# Patient Record
Sex: Male | Born: 1965 | Race: White | Hispanic: No | Marital: Married | Smoking: Current every day smoker
Health system: Southern US, Community
[De-identification: ages and names within clinical notes are randomized; demographics above are authoritative.]

## PROBLEM LIST (undated history)

## (undated) DIAGNOSIS — E785 Hyperlipidemia, unspecified: Secondary | ICD-10-CM

## (undated) DIAGNOSIS — E291 Testicular hypofunction: Secondary | ICD-10-CM

## (undated) DIAGNOSIS — I10 Essential (primary) hypertension: Secondary | ICD-10-CM

## (undated) DIAGNOSIS — E559 Vitamin D deficiency, unspecified: Secondary | ICD-10-CM

## (undated) HISTORY — DX: Testicular hypofunction: E29.1

## (undated) HISTORY — DX: Vitamin D deficiency, unspecified: E55.9

## (undated) HISTORY — DX: Essential (primary) hypertension: I10

## (undated) HISTORY — PX: ROTATOR CUFF REPAIR: SHX139

## (undated) HISTORY — DX: Hyperlipidemia, unspecified: E78.5

## (undated) HISTORY — PX: FINGER SURGERY: SHX640

---

## 1998-02-26 ENCOUNTER — Emergency Department (HOSPITAL_COMMUNITY): Admission: EM | Admit: 1998-02-26 | Discharge: 1998-02-26 | Payer: Self-pay | Admitting: Emergency Medicine

## 2004-08-09 ENCOUNTER — Emergency Department (HOSPITAL_COMMUNITY): Admission: EM | Admit: 2004-08-09 | Discharge: 2004-08-09 | Payer: Self-pay | Admitting: Emergency Medicine

## 2006-03-31 ENCOUNTER — Ambulatory Visit (HOSPITAL_COMMUNITY): Admission: RE | Admit: 2006-03-31 | Discharge: 2006-03-31 | Payer: Self-pay | Admitting: Internal Medicine

## 2012-10-09 ENCOUNTER — Emergency Department (HOSPITAL_COMMUNITY)
Admission: EM | Admit: 2012-10-09 | Discharge: 2012-10-09 | Disposition: A | Discharge status: Home / Self Care | Payer: BC Managed Care – PPO | Attending: Emergency Medicine | Admitting: Emergency Medicine

## 2012-10-09 ENCOUNTER — Encounter (HOSPITAL_COMMUNITY): Payer: Self-pay | Admitting: Emergency Medicine

## 2012-10-09 DIAGNOSIS — Y929 Unspecified place or not applicable: Secondary | ICD-10-CM | POA: Insufficient documentation

## 2012-10-09 DIAGNOSIS — Z79899 Other long term (current) drug therapy: Secondary | ICD-10-CM | POA: Insufficient documentation

## 2012-10-09 DIAGNOSIS — Y9389 Activity, other specified: Secondary | ICD-10-CM | POA: Insufficient documentation

## 2012-10-09 DIAGNOSIS — S058X9A Other injuries of unspecified eye and orbit, initial encounter: Secondary | ICD-10-CM | POA: Insufficient documentation

## 2012-10-09 DIAGNOSIS — IMO0002 Reserved for concepts with insufficient information to code with codable children: Secondary | ICD-10-CM | POA: Insufficient documentation

## 2012-10-09 DIAGNOSIS — S0500XA Injury of conjunctiva and corneal abrasion without foreign body, unspecified eye, initial encounter: Secondary | ICD-10-CM

## 2012-10-09 DIAGNOSIS — F17200 Nicotine dependence, unspecified, uncomplicated: Secondary | ICD-10-CM | POA: Insufficient documentation

## 2012-10-09 MED ORDER — TETRACAINE HCL 0.5 % OP SOLN
2.0000 [drp] | Freq: Once | OPHTHALMIC | Status: DC
  Filled 2012-10-09: qty 2

## 2012-10-09 MED ORDER — TRAMADOL HCL 50 MG PO TABS: 50.0000 mg | ORAL_TABLET | Freq: Four times a day (QID) | ORAL | Status: DC | PRN

## 2012-10-09 MED ORDER — POLYMYXIN B-TRIMETHOPRIM 10000-0.1 UNIT/ML-% OP SOLN: 1.0000 [drp] | OPHTHALMIC | Status: DC

## 2012-10-09 NOTE — ED Notes (Signed)
Pt states he inadvertently struck self in R eye with stick. Pt eye red/swollen. Pt c/o blurred vision

## 2012-10-09 NOTE — ED Provider Notes (Signed)
History    This chart was scribed for non-physician practitioner working with Gerhard Munch, MD by Burman Nieves. This patient was seen in room WTR9/WTR9 and the patient's care was started at 10:49 PM.   CSN: [id number]  Arrival date & time 10/09/12  2131      Chief Complaint  Patient presents with  . Eye Injury    (Consider location/radiation/quality/duration/timing/severity/associated sxs/prior treatment) Patient is a 47 y.o. male presenting with eye injury. The history is provided by the patient. No language interpreter was used.  Eye Injury This is a new problem. The current episode started 6 to 12 hours ago. The problem occurs constantly. The problem has not changed since onset.Pertinent negatives include no shortness of breath. Nothing aggravates the symptoms. Nothing relieves the symptoms. He has tried nothing for the symptoms.   HURSCHEL PAYNTER is a 47 y.o. male who presents to the Emergency Department complaining of moderate constant right eye pain onset today. He was scratched by a branch on the right eye today. He splashed some water on eye afterwards without relief. He reports blurred vision. Pt denies of fever, chills, cough, nausea, vomiting, diarrhea, SOB, weakness, and any other associated symptoms.  No drainage from the eye.  He does not wear contacts.   History reviewed. No pertinent past medical history.  Past Surgical History  Procedure Laterality Date  . Rotator cuff repair    . Finger surgery      No family history on file.  History  Substance Use Topics  . Smoking status: Current Every Day Smoker  . Smokeless tobacco: Not on file  . Alcohol Use: Yes     Comment: occasional      Review of Systems  Constitutional: Negative for fever and chills.  Eyes: Positive for pain, redness and visual disturbance. Negative for discharge.  Respiratory: Negative for shortness of breath.   Gastrointestinal: Negative for nausea and vomiting.  Neurological: Negative for  weakness.  All other systems reviewed and are negative.    Allergies  Vicodin  Home Medications   Current Outpatient Rx  Name  Route  Sig  Dispense  Refill  . ALPRAZolam (XANAX) 1 MG tablet   Oral   Take 1-1.5 mg by mouth at bedtime as needed for sleep (for sleep).         . Aspirin-Acetaminophen-Caffeine (GOODY HEADACHE PO)   Oral   Take 1 Package by mouth 2 (two) times daily as needed (for pain).         Marland Kitchen atorvastatin (LIPITOR) 80 MG tablet   Oral   Take 40 mg by mouth 3 (three) times a week.         . vitamin C (ASCORBIC ACID) 500 MG tablet   Oral   Take 500 mg by mouth every morning.           BP 179/91  Pulse 83  Temp(Src) 97.7 F (36.5 C) (Oral)  Resp 18  Ht 5\' 8"  (1.727 m)  Wt 170 lb (77.111 kg)  BMI 25.85 kg/m2  SpO2 97%  Physical Exam  Nursing note and vitals reviewed. Constitutional: He is oriented to person, place, and time. He appears well-developed and well-nourished. No distress.  HENT:  Head: Normocephalic and atraumatic.  Mouth/Throat: Oropharynx is clear and moist.  Eyes: Conjunctivae, EOM and lids are normal. Pupils are equal, round, and reactive to light. No foreign bodies found. Left conjunctiva is not injected. Left conjunctiva has no hemorrhage.  Slit lamp exam:  The right eye shows corneal abrasion and fluorescein uptake.  Right cornea abrasion  Neck: Normal range of motion. Neck supple. No tracheal deviation present.  Cardiovascular: Normal rate, regular rhythm and normal heart sounds.   Pulmonary/Chest: Effort normal and breath sounds normal. No respiratory distress.  Musculoskeletal: Normal range of motion.  Neurological: He is alert and oriented to person, place, and time.  Skin: Skin is warm and dry. He is not diaphoretic.  Psychiatric: He has a normal mood and affect. His behavior is normal.    ED Course  Procedures (including critical care time) DIAGNOSTIC STUDIES: Oxygen Saturation is 97% on room air, normal by  my interpretation.    COORDINATION OF CARE:  11:02 PM Discussed ED treatment with pt and pt agrees.     Labs Reviewed - No data to display No results found.   No diagnosis found.    MDM  Patient with corneal abrasion.  Patient given antibiotic eye drops and given referral to Opthalmology.    I personally performed the services described in this documentation, which was scribed in my presence. The recorded information has been reviewed and is accurate.    Pascal Lux Howard, PA-C 10/10/12 1048

## 2012-10-10 NOTE — ED Provider Notes (Signed)
  Medical screening examination/treatment/procedure(s) were performed by non-physician practitioner and as supervising physician I was immediately available for consultation/collaboration.    Deiontae Rabel, MD 10/10/12 2124 

## 2013-08-01 ENCOUNTER — Encounter: Payer: Self-pay | Admitting: Internal Medicine

## 2013-08-01 ENCOUNTER — Ambulatory Visit (INDEPENDENT_AMBULATORY_CARE_PROVIDER_SITE_OTHER): Payer: BC Managed Care – PPO | Admitting: Internal Medicine

## 2013-08-01 VITALS — BP 146/98 | HR 88 | Temp 99.9°F | Resp 18 | Wt 168.8 lb

## 2013-08-01 DIAGNOSIS — E782 Mixed hyperlipidemia: Secondary | ICD-10-CM

## 2013-08-01 DIAGNOSIS — R509 Fever, unspecified: Secondary | ICD-10-CM

## 2013-08-01 DIAGNOSIS — F411 Generalized anxiety disorder: Secondary | ICD-10-CM

## 2013-08-01 DIAGNOSIS — E559 Vitamin D deficiency, unspecified: Secondary | ICD-10-CM

## 2013-08-01 DIAGNOSIS — I10 Essential (primary) hypertension: Secondary | ICD-10-CM

## 2013-08-01 LAB — CBC WITH DIFFERENTIAL/PLATELET
Eosinophils Absolute: 0.5 10*3/uL (ref 0.0–0.7)
Eosinophils Relative: 6 % — ABNORMAL HIGH (ref 0–5)
HCT: 46.5 % (ref 39.0–52.0)
Hemoglobin: 15.7 g/dL (ref 13.0–17.0)
Lymphs Abs: 2.5 10*3/uL (ref 0.7–4.0)
MCH: 30.3 pg (ref 26.0–34.0)
MCHC: 33.8 g/dL (ref 30.0–36.0)
MCV: 89.6 fL (ref 78.0–100.0)
Monocytes Relative: 5 % (ref 3–12)
Platelets: 197 10*3/uL (ref 150–400)
RBC: 5.19 MIL/uL (ref 4.22–5.81)
RDW: 12.7 % (ref 11.5–15.5)
WBC: 8.1 10*3/uL (ref 4.0–10.5)

## 2013-08-01 MED ORDER — LEVOFLOXACIN 500 MG PO TABS: 500.0000 mg | ORAL_TABLET | Freq: Every day | ORAL | Status: AC

## 2013-08-01 MED ORDER — BISOPROLOL-HYDROCHLOROTHIAZIDE 5-6.25 MG PO TABS: 1.0000 | ORAL_TABLET | Freq: Every day | ORAL | Status: DC

## 2013-08-01 NOTE — Patient Instructions (Signed)
Continue diet & medications same as discussed.   Further disposition pending lab results.      Hypertension As your heart beats, it forces blood through your arteries. This force is your blood pressure. If the pressure is too high, it is called hypertension (HTN) or high blood pressure. HTN is dangerous because you may have it and not know it. High blood pressure may mean that your heart has to work harder to pump blood. Your arteries may be narrow or stiff. The extra work puts you at risk for heart disease, stroke, and other problems.  Blood pressure consists of two numbers, a higher number over a lower, 110/72, for example. It is stated as "110 over 72." The ideal is below 120 for the top number (systolic) and under 80 for the bottom (diastolic). Write down your blood pressure today. You should pay close attention to your blood pressure if you have certain conditions such as:  Heart failure.  Prior heart attack.  Diabetes  Chronic kidney disease.  Prior stroke.  Multiple risk factors for heart disease. To see if you have HTN, your blood pressure should be measured while you are seated with your arm held at the level of the heart. It should be measured at least twice. A one-time elevated blood pressure reading (especially in the Emergency Department) does not mean that you need treatment. There may be conditions in which the blood pressure is different between your right and left arms. It is important to see your caregiver soon for a recheck. Most people have essential hypertension which means that there is not a specific cause. This type of high blood pressure may be lowered by changing lifestyle factors such as:  Stress.  Smoking.  Lack of exercise.  Excessive weight.  Drug/tobacco/alcohol use.  Eating less salt. Most people do not have symptoms from high blood pressure until it has caused damage to the body. Effective treatment can often prevent, delay or reduce that  damage. TREATMENT  When a cause has been identified, treatment for high blood pressure is directed at the cause. There are a large number of medications to treat HTN. These fall into several categories, and your caregiver will help you select the medicines that are best for you. Medications may have side effects. You should review side effects with your caregiver. If your blood pressure stays high after you have made lifestyle changes or started on medicines,   Your medication(s) may need to be changed.  Other problems may need to be addressed.  Be certain you understand your prescriptions, and know how and when to take your medicine.  Be sure to follow up with your caregiver within the time frame advised (usually within two weeks) to have your blood pressure rechecked and to review your medications.  If you are taking more than one medicine to lower your blood pressure, make sure you know how and at what times they should be taken. Taking two medicines at the same time can result in blood pressure that is too low. SEEK IMMEDIATE MEDICAL CARE IF:  You develop a severe headache, blurred or changing vision, or confusion.  You have unusual weakness or numbness, or a faint feeling.  You have severe chest or abdominal pain, vomiting, or breathing problems. MAKE SURE YOU:   Understand these instructions.  Will watch your condition.  Will get help right away if you are not doing well or get worse. Document Released: 08/18/2005 Document Revised: 11/10/2011 Document Reviewed: 04/07/2008 [employer] Patient  Information 2014 [place], [place].  Cholesterol Cholesterol is a white, waxy, fat-like protein needed by your body in small amounts. The liver makes all the cholesterol you need. It is carried from the liver by the blood through the blood vessels. Deposits (plaque) may build up on blood vessel walls. This makes the arteries narrower and stiffer. Plaque increases the risk for heart attack and  stroke. You cannot feel your cholesterol level even if it is very high. The only way to know is by a blood test to check your lipid (fats) levels. Once you know your cholesterol levels, you should keep a record of the test results. Work with your caregiver to to keep your levels in the desired range. WHAT THE RESULTS MEAN:  Total cholesterol is a rough measure of all the cholesterol in your blood.  LDL is the so-called bad cholesterol. This is the type that deposits cholesterol in the walls of the arteries. You want this level to be low.  HDL is the good cholesterol because it cleans the arteries and carries the LDL away. You want this level to be high.  Triglycerides are fat that the body can either burn for energy or store. High levels are closely linked to heart disease. DESIRED LEVELS:  Total cholesterol below 200.  LDL below 100 for people at risk, below 70 for very high risk.  HDL above 50 is good, above 60 is best.  Triglycerides below 150. HOW TO LOWER YOUR CHOLESTEROL:  Diet.  Choose fish or white meat chicken and [place], roasted or baked. Limit fatty cuts of red meat, fried foods, and processed meats, such as sausage and lunch meat.  Eat lots of fresh fruits and vegetables. Choose whole grains, beans, pasta, potatoes and cereals.  Use only small amounts of olive, corn or canola oils. Avoid butter, mayonnaise, shortening or palm kernel oils. Avoid foods with trans-fats.  Use skim/nonfat milk and low-fat/nonfat yogurt and cheeses. Avoid whole milk, cream, ice cream, egg yolks and cheeses. Healthy desserts include angel food cake, ginger snaps, animal crackers, hard candy, popsicles, and low-fat/nonfat frozen yogurt. Avoid pastries, cakes, pies and cookies.  Exercise.  A regular program helps decrease LDL and raises HDL.  Helps with weight control.  Do things that increase your activity level like gardening, walking, or taking the stairs.  Medication.  May be  prescribed by your caregiver to help lowering cholesterol and the risk for heart disease.  You may need medicine even if your levels are normal if you have several risk factors. HOME CARE INSTRUCTIONS   Follow your diet and exercise programs as suggested by your caregiver.  Take medications as directed.  Have blood work done when your caregiver feels it is necessary. MAKE SURE YOU:   Understand these instructions.  Will watch your condition.  Will get help right away if you are not doing well or get worse. Document Released: 05/13/2001 Document Revised: 11/10/2011 Document Reviewed: 11/03/2007 [facility] Patient Information 2014 [place], [place]. Fever, Adult A fever is a higher than normal body temperature. In an adult, an oral temperature around 98.6 F (37 C) is considered normal. A temperature of 100.4 F (38 C) or higher is generally considered a fever. Mild or moderate fevers generally have no long-term effects and often do not require treatment. Extreme fever (greater than or equal to 106 F or 41.1 C) can cause seizures. The sweating that may occur with repeated or prolonged fever may cause dehydration. Elderly people can develop confusion during a fever.  A measured temperature can vary with:  Age.  Time of day.  Method of measurement (mouth, underarm, rectal, or ear). The fever is confirmed by taking a temperature with a thermometer. Temperatures can be taken different ways. Some methods are accurate and some are not.  An oral temperature is used most commonly. Electronic thermometers are fast and accurate.  An ear temperature will only be accurate if the thermometer is positioned as recommended by the manufacturer.  A rectal temperature is accurate and done for those adults who have a condition where an oral temperature cannot be taken.  An underarm (axillary) temperature is not accurate and not recommended. Fever is a symptom, not a disease.  CAUSES   Infections  commonly cause fever.  Some noninfectious causes for fever include:  Some arthritis conditions.  Some thyroid or adrenal gland conditions.  Some immune system conditions.  Some types of cancer.  A medicine reaction.  High doses of certain street drugs such as methamphetamine.  Dehydration.  Exposure to high outside or room temperatures.  Occasionally, the source of a fever cannot be determined. This is sometimes called a "fever of unknown origin" (FUO).  Some situations may lead to a temporary rise in body temperature that may go away on its own. Examples are:  Childbirth.  Surgery.  Intense exercise. HOME CARE INSTRUCTIONS   Take appropriate medicines for fever. Follow dosing instructions carefully. If you use acetaminophen to reduce the fever, be careful to avoid taking other medicines that also contain acetaminophen. Do not take aspirin for a fever if you are younger than age 65. There is an association with Reye's syndrome. Reye's syndrome is a rare but potentially deadly disease.  If an infection is present and antibiotics have been prescribed, take them as directed. Finish them even if you start to feel better.  Rest as needed.  Maintain an adequate fluid intake. To prevent dehydration during an illness with prolonged or recurrent fever, you may need to drink extra fluid.Drink enough fluids to keep your urine clear or pale yellow.  Sponging or bathing with room temperature water may help reduce body temperature. Do not use ice water or alcohol sponge baths.  Dress comfortably, but do not over-bundle. SEEK MEDICAL CARE IF:   You are unable to keep fluids down.  You develop vomiting or diarrhea.  You are not feeling at least partly better after 3 days.  You develop new symptoms or problems. SEEK IMMEDIATE MEDICAL CARE IF:   You have shortness of breath or trouble breathing.  You develop excessive weakness.  You are dizzy or you faint.  You are extremely  thirsty or you are making little or no urine.  You develop new pain that was not there before (such as in the head, neck, chest, back, or abdomen).  You have persistant vomiting and diarrhea for more than 1 to 2 days.  You develop a stiff neck or your eyes become sensitive to light.  You develop a skin rash.  You have a fever or persistent symptoms for more than 2 to 3 days.  You have a fever and your symptoms suddenly get worse. MAKE SURE YOU:   Understand these instructions.  Will watch your condition.  Will get help right away if you are not doing well or get worse. Document Released: 02/11/2001 Document Revised: 11/10/2011 Document Reviewed: 06/19/2011 [facility] Patient Information 2014 [place], [place].

## 2013-08-01 NOTE — Progress Notes (Signed)
Patient ID: Grant Campbell, male   DOB: [birth year], 47 y.o.   MRN: [id number]   This very nice 47 yo MWM presents for 6 week  follow up with Hypertension, Hyperlipidemia and Vitamin D deficiency. At last exam, patient's BP was 148/100. He attributes this to job stresses.   BP has been controlled at home. Today's BP is    . Patient denies any cardiac type chest pain, palpitations, dyspnea/orthopnea/PND, dizziness, claudication, or dependent edema.   Hyperlipidemia is not controlled with diet. Last LDL cholesterol was 125 and he freely admits poor dietary compliance.   Also, at last OV he admitted moderately heavy daily alcohol consumption of ~ 1& 1/2 fifths of whiskey per week. Since started on citalopram and evening alprazolam, he has reduced his total last 6 week alcohol consumption to less than 1 pint of liquor!   Further, Patient has history of vitamin D deficiency with last vitamin D of 47. Patient supplements vitamin D without any suspected side-effects.  Current Outpatient Prescriptions on File Prior to Visit  Medication Sig Dispense Refill   CITALOPRAM 40 mg qd     . ALPRAZolam (XANAX) 0.5 MG tablet Take 1-3 tab by mouth each evening      . Aspirin-Acetaminophen-Caffeine (GOODY HEADACHE PO) Take 1 Package by mouth 2 (two) times daily as needed (for pain).      Marland Kitchen atorvastatin (LIPITOR) 80 MG tablet Take 40 mg by mouth 3 (three) times a week.      . vitamin C (ASCORBIC ACID) 500 MG tablet Take 500 mg by mouth every morning.       No current facility-administered medications on file prior to visit.     Allergies  Allergen Reactions  . Vicodin [Hydrocodone-Acetaminophen] Other (See Comments)    "makes my skin crawl"    PMHx:   Past Medical History  Diagnosis Date  . Hyperlipidemia   . Hypertension   . Hypogonadism male   . Vitamin D deficiency     FHx:    Reviewed / unchanged  SHx:    Reviewed / unchanged  Systems Review: Constitutional: Denies fever, chills, wt changes,  headaches, insomnia, fatigue, night sweats, change in appetite. Eyes: Denies redness, blurred vision, diplopia, discharge, itchy, watery eyes.  ENT: Denies discharge, congestion, post nasal drip, epistaxis, sore throat, earache, hearing loss, dental pain, tinnitus, vertigo, sinus pain, snoring.  CV: Denies chest pain, palpitations, irregular heartbeat, syncope, dyspnea, diaphoresis, orthopnea, PND, claudication, edema. Respiratory: denies cough, dyspnea, DOE, pleurisy, hoarseness, laryngitis, wheezing.  Gastrointestinal: Denies dysphagia, odynophagia, heartburn, reflux, water brash, abdominal pain or cramps, nausea, vomiting, bloating, diarrhea, constipation, hematemesis, melena, hematochezia,  Hemorrhoids. Genitourinary: Denies dysuria, frequency, urgency, nocturia, hesitancy, discharge, hematuria, flank pain. Musculoskeletal: Denies arthralgias, myalgias, stiffness, jt. swelling, pain, limp, strain/sprain.  Skin: Denies pruritus, rash, hives, warts, acne, eczema, change in skin lesion(s). Neuro: No weakness, tremor, incoordination, spasms, paresthesia, or pain. Psychiatric: Denies confusion, memory loss, or sensory loss. Endo: Denies change in weight, skin, hair change.  Heme/Lymph: No excessive bleeding, bruising, orenlarged lymph nodes.  Filed Vitals:   08/01/13 1642  BP: 146/98  Pulse: 88  Temp: 99.9 F (37.7 C)  Resp: 18    Estimated body mass index is 25.67 kg/(m^2) as calculated from the following:   Height as of 10/09/12: 5\' 8"  (1.727 m).   Weight as of this encounter: 168 lb 12.8 oz (76.567 kg).  On Exam: Appears well nourished - in no distress. Eyes: PERRLA, EOMs, conjunctiva no swelling or erythema. Sinuses:  No frontal/maxillary tenderness ENT/Mouth: EAC's clear, TM's nl w/o erythema, bulging. Nares clear w/o erythema, swelling, exudates. Oropharynx clear without erythema or exudates. Oral hygiene is good. Tongue normal, non obstructing. Hearing intact.  Neck: Supple.  Thyroid nl. Car 2+/2+ without bruits, nodes or JVD. Chest: Respirations nl with BS clear & equal w/o rales, rhonchi, wheezing or stridor.  Cor: Heart sounds normal w/ regular rate and rhythm without sig. murmurs, gallops, clicks, or rubs. Peripheral pulses normal and equal  without edema.  Abdomen: Soft & bowel sounds normal. Non-tender w/o guarding, rebound, hernias, masses, or organomegaly.  Lymphatics: Unremarkable.  Musculoskeletal: Full ROM all peripheral extremities, joint stability, 5/5 strength, and normal gait.  Skin: Warm, dry without exposed rashes, lesions, ecchymosis apparent.  Neuro: Cranial nerves intact, reflexes equal bilaterally. Sensory-motor testing grossly intact. Tendon reflexes grossly intact.  Pysch: Alert & oriented x 3. Insight and judgement nl & appropriate. No ideations.  Assessment and Plan:  1. Hypertension - Continue monitor blood pressure at home. Start Rx Ziac-5  2. Hyperlipidemia - Continue diet/meds, exercise,& lifestyle modifications pending labs..   3. Vitamin D Deficiency - Continue supplementation.  4. Fever , obscure etiology - check CBC & U/A&C/S  Further disposition pending results of labs.

## 2013-08-02 LAB — LIPID PANEL
Cholesterol: 253 mg/dL — ABNORMAL HIGH (ref 0–200)
Total CHOL/HDL Ratio: 6.5 Ratio
Triglycerides: 418 mg/dL — ABNORMAL HIGH (ref <150)

## 2013-08-02 LAB — URINALYSIS, MICROSCOPIC ONLY
Bacteria, UA: NONE SEEN
Casts: NONE SEEN
Crystals: NONE SEEN
Squamous Epithelial / HPF: NONE SEEN

## 2013-08-03 ENCOUNTER — Telehealth: Payer: Self-pay

## 2013-08-03 LAB — URINE CULTURE
Colony Count: NO GROWTH
Organism ID, Bacteria: NO GROWTH

## 2013-08-03 NOTE — Telephone Encounter (Signed)
Pt's wife Roanna Raider) aware.

## 2013-08-29 ENCOUNTER — Other Ambulatory Visit: Payer: Self-pay | Admitting: Internal Medicine

## 2013-08-29 MED ORDER — PANTOPRAZOLE SODIUM 40 MG PO TBEC: 40.0000 mg | DELAYED_RELEASE_TABLET | Freq: Every day | ORAL | Status: DC

## 2013-09-27 ENCOUNTER — Encounter: Payer: Self-pay | Admitting: Internal Medicine

## 2013-09-28 NOTE — Progress Notes (Signed)
This encounter was created in error - please disregard.

## 2013-11-13 ENCOUNTER — Other Ambulatory Visit: Payer: Self-pay | Admitting: Internal Medicine

## 2013-12-13 ENCOUNTER — Encounter: Payer: Self-pay | Admitting: Internal Medicine

## 2013-12-13 ENCOUNTER — Ambulatory Visit (INDEPENDENT_AMBULATORY_CARE_PROVIDER_SITE_OTHER): Payer: BC Managed Care – PPO | Admitting: Internal Medicine

## 2013-12-13 VITALS — BP 132/90 | HR 80 | Temp 98.4°F | Resp 16 | Ht 68.5 in | Wt 179.6 lb

## 2013-12-13 DIAGNOSIS — Z79899 Other long term (current) drug therapy: Secondary | ICD-10-CM | POA: Insufficient documentation

## 2013-12-13 DIAGNOSIS — F411 Generalized anxiety disorder: Secondary | ICD-10-CM

## 2013-12-13 DIAGNOSIS — I10 Essential (primary) hypertension: Secondary | ICD-10-CM

## 2013-12-13 MED ORDER — ALPRAZOLAM 0.5 MG PO TABS: ORAL_TABLET | ORAL | Status: DC

## 2013-12-13 NOTE — Progress Notes (Signed)
   Subjective:    Patient ID: Grant Campbell, male    DOB: [birth year], 48 y.o.   MRN: [id number]  HPI [name] returns toiday for F/u of response to BP meds (ziac) which he self discontinued for obscure reasons. He also was rx'd ciatlopram for reported  "short fuse", agitation and irritability which he also stopped because of sedation. He is continuing to take his cholesterol med and also still take Alprazolam at hs for sleep.  Medication Sig  . ALPRAZolam (XANAX) 0.5 MG tablet Take 1-2 tablets at bedtime as needed for sleep  . Aspirin-APAP-Caffeine (GOODY HEADACHE ) Take 1 Package by mouth 2  times daily as needed (for pain).  Marland Kitchen. atorvastatin (LIPITOR) 80 MG tablet TAKE 1 TABLET DAILY FOR CHOLESTEROL  . pantoprazole (PROTONIX) 40 MG tablet Take 1 tablet (40 mg total) by mouth daily.  . traMADol (ULTRAM) 50 MG tablet Take 1 tablet (50 mg total) by mouth every 6 (six) hours as needed for pain.  Marland Kitchen. VIAGRA 100 MG tablet  Take prn  . vitamin C (ASCORBIC ACID) 500 MG tablet Take 500 mg by mouth every morning.   Allergies  Allergen Reactions  . Vicodin [Hydrocodone-Acetaminophen] Other (See Comments)    "makes my skin crawl"   Past Medical History  Diagnosis Date  . Hyperlipidemia   . Hypertension   . Hypogonadism male   . Vitamin D deficiency    Review of Systems  In addition to the HPI above,  No Fever-chills,  No Headache, No changes with Vision or hearing,  No problems swallowing food or Liquids,  No Chest pain or productive Cough or Shortness of Breath,  No Abdominal pain, No Nausea or Vommitting, Bowel movements are regular,  No Blood in stool or Urine,  No dysuria,  No new skin rashes or bruises,  No new joints pains-aches,  No new weakness, tingling, numbness in any extremity,  No recent weight loss,  No polyuria, polydypsia or polyphagia,  No significant Mental Stressors.  A full 10 point Review of Systems was done, except as stated above, all other Review of Systems were  negative  Objective:   Physical Exam  BP 132/90  Pulse 80  Temp(Src) 98.4 F (36.9 C) (Temporal)  Resp 16  Ht 5' 8.5" (1.74 m)  Wt 179 lb 9.6 oz (81.466 kg)  BMI 26.91 kg/m2  HEENT - Eac's patent. TM's Nl.EOM's full. PERRLA. NasoOroPharynx clear. Neck - supple. Nl Thyroid. No bruits nodes JVD Chest - Clear equal BS Cor - Nl HS. RRR w/o sig MGR. PP 1(+) No edema. Abd - No palpable organomegaly, masses or tenderness. BS nl. MS- FROM. w/o deformities. Muscle power tone and bulk Nl. Gait Nl. Neuro - No obvious Cr N abnormalities. Sensory, motor and Cerebellar functions appear Nl w/o focal abnormalities.  Assessment & Plan:   1- HTN   2- Hyperlipidemia  3- Chronic Anxiety  Recc patient retry lower dose citalopram 40 mg at 1/4 tab (=10 mg) daily and return in 6 weeks for re-evaluation and recheck chol/liver labs.

## 2013-12-13 NOTE — Patient Instructions (Signed)
   Try restart Citalopram 40 mg at 1/4 tablet  (= 10 mg) daily

## 2013-12-27 ENCOUNTER — Other Ambulatory Visit: Payer: Self-pay | Admitting: Physician Assistant

## 2013-12-27 MED ORDER — ALPRAZOLAM 0.5 MG PO TABS: ORAL_TABLET | ORAL | Status: DC

## 2014-01-26 ENCOUNTER — Ambulatory Visit: Payer: Self-pay | Admitting: Internal Medicine

## 2014-06-12 ENCOUNTER — Encounter: Payer: Self-pay | Admitting: Internal Medicine

## 2014-06-19 ENCOUNTER — Encounter: Payer: Self-pay | Admitting: Internal Medicine

## 2014-06-19 ENCOUNTER — Ambulatory Visit (INDEPENDENT_AMBULATORY_CARE_PROVIDER_SITE_OTHER): Payer: BC Managed Care – PPO | Admitting: Internal Medicine

## 2014-06-19 VITALS — BP 146/104 | HR 80 | Temp 99.3°F | Resp 16 | Ht 68.0 in | Wt 180.4 lb

## 2014-06-19 DIAGNOSIS — Z111 Encounter for screening for respiratory tuberculosis: Secondary | ICD-10-CM

## 2014-06-19 DIAGNOSIS — Z9119 Patient's noncompliance with other medical treatment and regimen: Secondary | ICD-10-CM

## 2014-06-19 DIAGNOSIS — I10 Essential (primary) hypertension: Secondary | ICD-10-CM

## 2014-06-19 DIAGNOSIS — Z125 Encounter for screening for malignant neoplasm of prostate: Secondary | ICD-10-CM

## 2014-06-19 DIAGNOSIS — Z79899 Other long term (current) drug therapy: Secondary | ICD-10-CM

## 2014-06-19 DIAGNOSIS — Z91199 Patient's noncompliance with other medical treatment and regimen due to unspecified reason: Secondary | ICD-10-CM

## 2014-06-19 DIAGNOSIS — E782 Mixed hyperlipidemia: Secondary | ICD-10-CM

## 2014-06-19 DIAGNOSIS — Z1212 Encounter for screening for malignant neoplasm of rectum: Secondary | ICD-10-CM

## 2014-06-19 DIAGNOSIS — R7303 Prediabetes: Secondary | ICD-10-CM

## 2014-06-19 DIAGNOSIS — R6889 Other general symptoms and signs: Secondary | ICD-10-CM

## 2014-06-19 DIAGNOSIS — R7309 Other abnormal glucose: Secondary | ICD-10-CM | POA: Insufficient documentation

## 2014-06-19 DIAGNOSIS — Z0001 Encounter for general adult medical examination with abnormal findings: Secondary | ICD-10-CM

## 2014-06-19 DIAGNOSIS — E559 Vitamin D deficiency, unspecified: Secondary | ICD-10-CM

## 2014-06-19 LAB — HEMOGLOBIN A1C
Hgb A1c MFr Bld: 5.5 % (ref <5.7)
Mean Plasma Glucose: 111 mg/dL (ref <117)

## 2014-06-19 NOTE — Patient Instructions (Signed)

## 2014-06-19 NOTE — Progress Notes (Signed)
Patient ID: Grant Campbell, male   DOB: 09/21/1965, 48 y.o.   MRN: [id number]  Annual Screening Comprehensive Examination  This very nice 48 y.o.MWM  presents for complete physical.  Patient has been followed for HTN,  Prediabetes, Hyperlipidemia, and Vitamin D Deficiency.   Patient has labile HTN predating  since 2010 and has been monitored expectantly. Patient's BP has been controlled at home.Today's BP: 146/104 mmHg. Patient denies any cardiac symptoms as chest pain, palpitations, shortness of breath, dizziness or ankle swelling.   Patient's hyperlipidemia is controlled with diet and medications. Patient denies myalgias or other medication SE's.  Patient discontinued Atorvastatin after last OV 1 year ago and last lipids were not at goal -  Total Chol 253*; HDL 39*; Trig 418 on 08/01/2013.   Patient has prediabetes since 2012 with A1c 5.7%  and  denies reactive hypoglycemic symptoms, visual blurring, diabetic polys or paresthesias. Last A1c was 5.1% with elevated insulin level 34 in Oct 2014.      Finally, patient has history of Vitamin D Deficiency of 35 in 2010  and last vitamin D was 1547 in Oct 2014 and he was recommended to take 5,000 u/day, but admits only infrequent sporadic compliance.   Medication Sig  . ALPRAZolam (XANAX) 0.5 MG tablet 1/2-1 tablet up to three times daily as needed for anxiety/sleep  . Aspirin-Acetaminophen-Caffeine (GOODY HEADACHE PO) Take 1 Package by mouth 2 (two) times daily as needed (for pain).  . pantoprazole (PROTONIX) 40 MG tablet Take 1 tablet (40 mg total) by mouth daily.  . traMADol (ULTRAM) 50 MG tablet Take 1 tablet (50 mg total) by mouth every 6 (six) hours as needed for pain.  Marland Kitchen. VIAGRA 100 MG tablet   . vitamin C (ASCORBIC ACID) 500 MG tablet Take 500 mg by mouth every morning.   Allergies  Allergen Reactions  . Vicodin [Hydrocodone-Acetaminophen] Other (See Comments)    "makes my skin crawl"   Past Medical History  Diagnosis Date  . Hyperlipidemia    . Hypertension   . Hypogonadism male   . Vitamin D deficiency    Health Maintenance  Topic Date Due  . Tetanus/tdap  11/15/1984  . Influenza Vaccine  04/01/2014   Immunization History  Administered Date(s) Administered  . DTaP 09/02/2007  . PPD Test 06/19/2014   Past Surgical History  Procedure Laterality Date  . Rotator cuff repair    . Finger surgery     Family History  Problem Relation Age of Onset  . Heart attack Mother   . Kidney failure Father   . Hypertension Brother   . Hypothyroidism Brother   . Hyperlipidemia Brother    History   Social History  . Marital Status: Married    Spouse Name: N/A    Number of Children: N/A  . Years of Education: N/A   Occupational History  . Games developerConstruction supervisor - [employer]   Social History Main Topics  . Smoking status: Current Every Day Smoker -- 1.50 packs/day    Types: Cigarettes  . Smokeless tobacco: Not on file  . Alcohol Use: Yes     Comment: occasional  . Drug Use: No  . Sexual Activity: Active    ROS Constitutional: Denies fever, chills, weight loss/gain, headaches, insomnia, fatigue, night sweats or change in appetite. Eyes: Denies redness, blurred vision, diplopia, discharge, itchy or watery eyes.  ENT: Denies discharge, congestion, post nasal drip, epistaxis, sore throat, earache, hearing loss, dental pain, Tinnitus, Vertigo, Sinus pain or snoring.  Cardio: Denies  chest pain, palpitations, irregular heartbeat, syncope, dyspnea, diaphoresis, orthopnea, PND, claudication or edema Respiratory: denies cough, dyspnea, DOE, pleurisy, hoarseness, laryngitis or wheezing.  Gastrointestinal: Denies dysphagia, heartburn, reflux, water brash, pain, cramps, nausea, vomiting, bloating, diarrhea, constipation, hematemesis, melena, hematochezia, jaundice or hemorrhoids Genitourinary: Denies dysuria, frequency, urgency, nocturia, hesitancy, discharge, hematuria or flank pain Musculoskeletal: Denies arthralgia, myalgia,  stiffness, Jt. Swelling, pain, limp or strain/sprain. Denies Falls. Skin: Denies puritis, rash, hives, warts, acne, eczema or change in skin lesion Neuro: No weakness, tremor, incoordination, spasms, paresthesia or pain Psychiatric: Denies confusion, memory loss or sensory loss. Denies Depression. Endocrine: Denies change in weight, skin, hair change, nocturia, and paresthesia, diabetic polys, visual blurring or hyper / hypo glycemic episodes.  Heme/Lymph: No excessive bleeding, bruising or enlarged lymph nodes.  Physical Exam  BP 146/104  Pulse 80  Temp  99.3 F   Resp 16  Ht 5\' 8"    Wt 180 lb 6.4 oz   BMI 27.44   General Appearance: Well nourished, in no apparent distress. Eyes: PERRLA, EOMs, conjunctiva no swelling or erythema, normal fundi and vessels. Sinuses: No frontal/maxillary tenderness ENT/Mouth: EACs patent / TMs  nl. Nares clear without erythema, swelling, mucoid exudates. Oral hygiene is good. No erythema, swelling, or exudate. Tongue normal, non-obstructing. Tonsils not swollen or erythematous. Hearing normal.  Neck: Supple, thyroid normal. No bruits, nodes or JVD. Respiratory: Respiratory effort normal.  BS equal and clear bilateral without rales, rhonci, wheezing or stridor. Cardio: Heart sounds are normal with regular rate and rhythm and no murmurs, rubs or gallops. Peripheral pulses are normal and equal bilaterally without edema. No aortic or femoral bruits. Chest: symmetric with normal excursions and percussion.  Abdomen: Flat, soft, with bowl sounds. Nontender, no guarding, rebound, hernias, masses, or organomegaly.  Lymphatics: Non tender without lymphadenopathy.  Genitourinary: No hernias.Testes nl. DRE - prostate nl for age - smooth & firm w/o nodules. Musculoskeletal: Full ROM all peripheral extremities, joint stability, 5/5 strength, and normal gait. Skin: Warm and dry without rashes, lesions, cyanosis, clubbing or  ecchymosis.  Neuro: Cranial nerves intact,  reflexes equal bilaterally. Normal muscle tone, no cerebellar symptoms. Sensation intact.  Pysch: Awake and oriented X 3with normal affect, insight and judgment appropriate.   Assessment and Plan  1. Annual Screening Examination 2. Hypertension  3. Hyperlipidemia 4. Pre Diabetes 5. Vitamin D Deficiency   Continue prudent diet as discussed, weight control, BP monitoring, regular exercise, and medications as discussed.  Discussed med effects and SE's. Routine screening labs and tests as requested with regular follow-up as recommended.

## 2014-06-20 LAB — VITAMIN B12: Vitamin B-12: 507 pg/mL (ref 211–911)

## 2014-06-20 LAB — CBC WITH DIFFERENTIAL/PLATELET
Basophils Absolute: 0.1 10*3/uL (ref 0.0–0.1)
Basophils Relative: 1 % (ref 0–1)
Eosinophils Absolute: 0.4 10*3/uL (ref 0.0–0.7)
Eosinophils Relative: 6 % — ABNORMAL HIGH (ref 0–5)
HCT: 42.5 % (ref 39.0–52.0)
Hemoglobin: 15.2 g/dL (ref 13.0–17.0)
Lymphocytes Relative: 27 % (ref 12–46)
Lymphs Abs: 1.9 10*3/uL (ref 0.7–4.0)
MCH: 29.4 pg (ref 26.0–34.0)
MCHC: 35.8 g/dL (ref 30.0–36.0)
MCV: 82.2 fL (ref 78.0–100.0)
Monocytes Absolute: 0.4 10*3/uL (ref 0.1–1.0)
Monocytes Relative: 6 % (ref 3–12)
Neutro Abs: 4.2 10*3/uL (ref 1.7–7.7)
Neutrophils Relative %: 60 % (ref 43–77)
Platelets: 194 10*3/uL (ref 150–400)
RBC: 5.17 MIL/uL (ref 4.22–5.81)
RDW: 14.1 % (ref 11.5–15.5)
WBC: 7 10*3/uL (ref 4.0–10.5)

## 2014-06-20 LAB — BASIC METABOLIC PANEL WITH GFR
BUN: 10 mg/dL (ref 6–23)
CO2: 30 mEq/L (ref 19–32)
Calcium: 9.4 mg/dL (ref 8.4–10.5)
Chloride: 104 mEq/L (ref 96–112)
Creat: 0.88 mg/dL (ref 0.50–1.35)
GFR, Est African American: >89 mL/min
GFR, Est Non African American: >89 mL/min
Glucose, Bld: 85 mg/dL (ref 70–99)
Potassium: 4.2 mEq/L (ref 3.5–5.3)
Sodium: 143 mEq/L (ref 135–145)

## 2014-06-20 LAB — URINALYSIS, MICROSCOPIC ONLY
Bacteria, UA: NONE SEEN
Casts: NONE SEEN
Crystals: NONE SEEN
Squamous Epithelial / HPF: NONE SEEN

## 2014-06-20 LAB — HEPATIC FUNCTION PANEL
ALT: 13 U/L (ref 0–53)
AST: 21 U/L (ref 0–37)
Albumin: 4.3 g/dL (ref 3.5–5.2)
Alkaline Phosphatase: 67 U/L (ref 39–117)
Bilirubin, Direct: 0.1 mg/dL (ref 0.0–0.3)
Indirect Bilirubin: 0.4 mg/dL (ref 0.2–1.2)
Total Bilirubin: 0.5 mg/dL (ref 0.2–1.2)
Total Protein: 6.6 g/dL (ref 6.0–8.3)

## 2014-06-20 LAB — VITAMIN D 25 HYDROXY (VIT D DEFICIENCY, FRACTURES): Vit D, 25-Hydroxy: 47 ng/mL (ref 30–89)

## 2014-06-20 LAB — MAGNESIUM: Magnesium: 2 mg/dL (ref 1.5–2.5)

## 2014-06-20 LAB — LIPID PANEL
Cholesterol: 213 mg/dL — ABNORMAL HIGH (ref 0–200)
HDL: 48 mg/dL (ref >39)
LDL Cholesterol: 131 mg/dL — ABNORMAL HIGH (ref 0–99)
Total CHOL/HDL Ratio: 4.4 Ratio
Triglycerides: 172 mg/dL — ABNORMAL HIGH (ref <150)
VLDL: 34 mg/dL (ref 0–40)

## 2014-06-20 LAB — TESTOSTERONE: Testosterone: 417 ng/dL (ref 300–890)

## 2014-06-20 LAB — INSULIN, FASTING: Insulin fasting, serum: 2.3 u[IU]/mL (ref 2.0–19.6)

## 2014-06-20 LAB — MICROALBUMIN / CREATININE URINE RATIO
Creatinine, Urine: 23.1 mg/dL
Microalb Creat Ratio: 8.7 mg/g (ref 0.0–30.0)
Microalb, Ur: 0.2 mg/dL (ref <2.0)

## 2014-06-20 LAB — PSA: PSA: 0.74 ng/mL (ref <=4.00)

## 2014-06-20 LAB — TSH: TSH: 0.934 u[IU]/mL (ref 0.350–4.500)

## 2014-06-26 ENCOUNTER — Other Ambulatory Visit: Payer: Self-pay | Admitting: *Deleted

## 2014-06-26 MED ORDER — ALPRAZOLAM 0.5 MG PO TABS: ORAL_TABLET | ORAL | Status: DC

## 2014-06-26 MED ORDER — PANTOPRAZOLE SODIUM 40 MG PO TBEC: 40.0000 mg | DELAYED_RELEASE_TABLET | Freq: Every day | ORAL | Status: DC

## 2014-08-07 ENCOUNTER — Other Ambulatory Visit: Payer: Self-pay | Admitting: *Deleted

## 2014-08-07 MED ORDER — SILDENAFIL CITRATE 100 MG PO TABS: 100.0000 mg | ORAL_TABLET | ORAL | Status: DC | PRN

## 2014-08-07 MED ORDER — ALPRAZOLAM 0.5 MG PO TABS: ORAL_TABLET | ORAL | Status: DC

## 2015-04-16 ENCOUNTER — Other Ambulatory Visit: Payer: Self-pay | Admitting: Internal Medicine

## 2015-04-16 DIAGNOSIS — F411 Generalized anxiety disorder: Secondary | ICD-10-CM

## 2015-06-04 ENCOUNTER — Other Ambulatory Visit: Payer: Self-pay | Admitting: *Deleted

## 2015-06-04 ENCOUNTER — Ambulatory Visit (HOSPITAL_COMMUNITY)
Admission: RE | Admit: 2015-06-04 | Discharge: 2015-06-04 | Disposition: A | Discharge status: Home / Self Care | Payer: BLUE CROSS/BLUE SHIELD | Source: Ambulatory Visit | Attending: Internal Medicine | Admitting: Internal Medicine

## 2015-06-04 DIAGNOSIS — I10 Essential (primary) hypertension: Secondary | ICD-10-CM

## 2015-06-04 DIAGNOSIS — F17200 Nicotine dependence, unspecified, uncomplicated: Secondary | ICD-10-CM | POA: Insufficient documentation

## 2015-06-04 DIAGNOSIS — M419 Scoliosis, unspecified: Secondary | ICD-10-CM | POA: Insufficient documentation

## 2015-06-28 ENCOUNTER — Other Ambulatory Visit: Payer: Self-pay | Admitting: *Deleted

## 2015-06-28 ENCOUNTER — Encounter: Payer: Self-pay | Admitting: Internal Medicine

## 2015-06-28 ENCOUNTER — Ambulatory Visit (INDEPENDENT_AMBULATORY_CARE_PROVIDER_SITE_OTHER): Payer: BLUE CROSS/BLUE SHIELD | Admitting: Internal Medicine

## 2015-06-28 VITALS — HR 76 | Temp 97.5°F | Resp 16 | Ht 68.0 in | Wt 177.6 lb

## 2015-06-28 DIAGNOSIS — E559 Vitamin D deficiency, unspecified: Secondary | ICD-10-CM

## 2015-06-28 DIAGNOSIS — R6889 Other general symptoms and signs: Secondary | ICD-10-CM | POA: Diagnosis not present

## 2015-06-28 DIAGNOSIS — Z79899 Other long term (current) drug therapy: Secondary | ICD-10-CM

## 2015-06-28 DIAGNOSIS — E782 Mixed hyperlipidemia: Secondary | ICD-10-CM

## 2015-06-28 DIAGNOSIS — Z125 Encounter for screening for malignant neoplasm of prostate: Secondary | ICD-10-CM | POA: Diagnosis not present

## 2015-06-28 DIAGNOSIS — Z1212 Encounter for screening for malignant neoplasm of rectum: Secondary | ICD-10-CM

## 2015-06-28 DIAGNOSIS — Z111 Encounter for screening for respiratory tuberculosis: Secondary | ICD-10-CM

## 2015-06-28 DIAGNOSIS — Z0001 Encounter for general adult medical examination with abnormal findings: Secondary | ICD-10-CM | POA: Diagnosis not present

## 2015-06-28 DIAGNOSIS — R7303 Prediabetes: Secondary | ICD-10-CM | POA: Diagnosis not present

## 2015-06-28 DIAGNOSIS — I10 Essential (primary) hypertension: Secondary | ICD-10-CM | POA: Diagnosis not present

## 2015-06-28 DIAGNOSIS — Z6827 Body mass index (BMI) 27.0-27.9, adult: Secondary | ICD-10-CM

## 2015-06-28 DIAGNOSIS — R5383 Other fatigue: Secondary | ICD-10-CM

## 2015-06-28 DIAGNOSIS — F411 Generalized anxiety disorder: Secondary | ICD-10-CM

## 2015-06-28 DIAGNOSIS — K219 Gastro-esophageal reflux disease without esophagitis: Secondary | ICD-10-CM | POA: Insufficient documentation

## 2015-06-28 MED ORDER — SILDENAFIL CITRATE 100 MG PO TABS: 100.0000 mg | ORAL_TABLET | ORAL | Status: DC | PRN

## 2015-06-28 MED ORDER — PANTOPRAZOLE SODIUM 40 MG PO TBEC: 40.0000 mg | DELAYED_RELEASE_TABLET | Freq: Every day | ORAL | Status: DC

## 2015-06-28 NOTE — Progress Notes (Signed)
Patient ID: Grant Campbell, male   DOB: 06-Aug-1966, 49 y.o.   MRN: [id number]   Annual Preventative Visit  Examination     This very nice 49 y.o. MWM presents for  presents for a Preventative Visit & comprehensive evaluation and management of multiple medical co-morbidities.  Patient has been followed for HTN, Prediabetes, Hyperlipidemia, and Vitamin D Deficiency.     Labile HTN predates since  2010. Patient's BP has been controlled at home.Today's 142/100 by the nurse and 184/120 by myself. Patient denies any cardiac symptoms as chest pain, palpitations, shortness of breath, dizziness or ankle swelling.       Patient's hyperlipidemia is controlled with diet and medications. Patient denies myalgias or other medication SE's. Last lipids were not at goal with Total Chol 213, HDL 40, elevated Trig 172 and elevated LDL 131 in Oct 2015.     Patient has prediabetes since 2010 with A1c 5.7% and down to 5.1% in 3013 and patient denies reactive hypoglycemic symptoms, visual blurring, diabetic polys or paresthesias. Last A1c was 5.5% in Oct 2015.       Finally, patient has history of Vitamin D Deficiency of 35 in 2010 and last vitamin D was very low at 20 in Oct 2015.     Medication Sig  . ALPRAZolam  0.5 MG tablet TAKE 1/2 TO 1 TABLET UP TO 3 TIMES A DAY AS NEEDED FOR ANXIETY.  . Aspirin-Acetaminophen-Caffeine (GOODY HEADACHE PO) Take 1 Package by mouth 2 (two) times daily as needed (for pain).  . pantoprazole  40 MG tablet Take 1 tablet (40 mg total) by mouth daily.  . sildenafil 100 MG tablet Take 1 tablet (100 mg total) by mouth as needed for erectile dysfunction.  . traMADol  50 MG tablet Take 1 tablet (50 mg total) by mouth every 6 (six) hours as needed for pain.  . vitamin C  500 MG tablet Take 500 mg by mouth every morning.   Allergies  Allergen Reactions  . Vicodin [Hydrocodone-Acetaminophen] Other (See Comments)    "makes my skin crawl"   Past Medical History  Diagnosis Date  .  Hyperlipidemia   . Hypertension   . Hypogonadism male   . Vitamin D deficiency    Health Maintenance  Topic Date Due  . HIV Screening  11/15/1980  . TETANUS/TDAP  11/15/1984  . INFLUENZA VACCINE  04/02/2015   Immunization History  Administered Date(s) Administered  . DTaP 09/02/2007  . PPD Test 06/19/2014, 06/28/2015   Past Surgical History  Procedure Laterality Date  . Rotator cuff repair    . Finger surgery     Family History  Problem Relation Age of Onset  . Heart attack Mother   . Kidney failure Father   . Hypertension Brother   . Hypothyroidism Brother   . Hyperlipidemia Brother    Social History   Social History  . Marital Status: Married    Spouse Name: N/A  . Number of Children: N/A  . Years of Education: N/A   Occupational History  . Engineer, maintenance   Social History Main Topics  . Smoking status: Current Every Day Smoker -- 1.00 packs/day    Types: Cigarettes  . Smokeless tobacco: Not on file  . Alcohol Use: 4.2 oz/week    7 Standard drinks or equivalent per week     Comment: occasional  . Drug Use: No  . Sexual Activity: Active    ROS Constitutional: Denies fever, chills, weight loss/gain, headaches, insomnia,  night sweats  or change in appetite. Does c/o fatigue. Eyes: Denies redness, blurred vision, diplopia, discharge, itchy or watery eyes.  ENT: Denies discharge, congestion, post nasal drip, epistaxis, sore throat, earache, hearing loss, dental pain, Tinnitus, Vertigo, Sinus pain or snoring.  Cardio: Denies chest pain, palpitations, irregular heartbeat, syncope, dyspnea, diaphoresis, orthopnea, PND, claudication or edema Respiratory: denies cough, dyspnea, DOE, pleurisy, hoarseness, laryngitis or wheezing.  Gastrointestinal: Denies dysphagia, heartburn, reflux, water brash, pain, cramps, nausea, vomiting, bloating, diarrhea, constipation, hematemesis, melena, hematochezia, jaundice or hemorrhoids Genitourinary: Denies dysuria,  frequency, urgency, nocturia, hesitancy, discharge, hematuria or flank pain Musculoskeletal: Denies arthralgia, myalgia, stiffness, Jt. Swelling, pain, limp or strain/sprain. Denies Falls. Skin: Denies puritis, rash, hives, warts, acne, eczema or change in skin lesion Neuro: No weakness, tremor, incoordination, spasms, paresthesia or pain Psychiatric: Denies confusion, memory loss or sensory loss. Denies Depression. Endocrine: Denies change in weight, skin, hair change, nocturia, and paresthesia, diabetic polys, visual blurring or hyper / hypo glycemic episodes.  Heme/Lymph: No excessive bleeding, bruising or enlarged lymph nodes.  Physical Exam  Pulse 76  Temp(Src) 97.5 F (36.4 C)  Resp 16  Ht 5\' 8"  (1.727 m)  Wt 177 lb 9.6 oz (80.559 kg)  BMI 27.01 kg/m2  General Appearance: Well nourished &  in no apparent distress. Eyes: PERRLA, EOMs, conjunctiva no swelling or erythema, normal fundi and vessels. Sinuses: No frontal/maxillary tenderness ENT/Mouth: EACs patent / TMs  nl. Nares clear without erythema, swelling, mucoid exudates. Oral hygiene is good. No erythema, swelling, or exudate. Tongue normal, non-obstructing. Tonsils not swollen or erythematous. Hearing normal.  Neck: Supple, thyroid normal. No bruits, nodes or JVD. Respiratory: Respiratory effort normal.  BS equal and clear bilateral without rales, rhonci, wheezing or stridor. Cardio: Heart sounds are normal with regular rate and rhythm and no murmurs, rubs or gallops. Peripheral pulses are normal and equal bilaterally without edema. No aortic or femoral bruits. Chest: symmetric with normal excursions and percussion.  Abdomen: Flat, soft, with bowel sounds. Nontender, no guarding, rebound, hernias, masses, or organomegaly.  Lymphatics: Non tender without lymphadenopathy.  Genitourinary: No hernias.Testes nl. DRE - prostate nl for age - smooth & firm w/o nodules. Musculoskeletal: Full ROM all peripheral extremities, joint  stability, 5/5 strength, and normal gait. Skin: Warm and dry without rashes, lesions, cyanosis, clubbing or  ecchymosis.  Neuro: Cranial nerves intact, reflexes equal bilaterally. Normal muscle tone, no cerebellar symptoms. Sensation intact.  Pysch: Alert and oriented X 3 with normal affect, insight and judgment appropriate.   Assessment and Plan  1. Encounter for general adult medical examination with abnormal findings  - Microalbumin / creatinine urine ratio - EKG 12-Lead - POC Hemoccult Bld/Stl (3-Cd Home Screen); Future - Vitamin B12 - PSA - Testosterone - Iron and TIBC - Urinalysis, Routine w reflex microscopic  - CBC with Differential/Platelet - BASIC METABOLIC PANEL WITH GFR - Hepatic function panel - Magnesium - Lipid panel - TSH - Hemoglobin A1c - Insulin, random - Vit D  25 hydroxy - PPD  2. Essential hypertension  - Rx Atenolol 100 mg # 90 x 1 rf  - ROV 2 weeks to recheck BP  - Microalbumin / creatinine urine ratio - EKG 12-Lead - POC Hemoccult Bld/Stl  - TSH  3. Hyperlipidemia  - Lipid panel  4. Prediabetes  - Hemoglobin A1c - Insulin, random  5. Vitamin D deficiency  - Vit D  25 hydroxy   6. Gastroesophageal reflux disease   7. Screening for rectal cancer   8. Prostate cancer  screening  - PSA  9. Anxiety state   10. Other fatigue  - Vitamin B12 - Testosterone - Iron and TIBC - TSH  11. Screening examination for pulmonary tuberculosis  - PPD  12. Medication management  - Urinalysis, Routine w reflex microscopic  - CBC with Differential/Platelet - BASIC METABOLIC PANEL WITH GFR - Hepatic function panel - Magnesium  13. BMI 27.0-27.9,adult   Continue prudent diet as discussed, weight control, BP monitoring, regular exercise, and medications as discussed.  Discussed med effects and SE's. Routine screening labs and tests as requested with regular follow-up as recommended.  Over 40 minutes of exam, counseling &  chart review  was performed

## 2015-06-28 NOTE — Patient Instructions (Signed)
Recommend Adult Low Dose Aspirin or   coated  Aspirin 81 mg daily   To reduce risk of Colon Cancer 20 %,   Skin Cancer 26 % ,   Melanoma 46%   and   Pancreatic cancer 60%   ++++++++++++++++++++++++++++++++++++++++++++++++++++++  Vitamin D goal   is between 70-100.   Please make sure that you are taking your Vitamin D as directed.   It is very important as a natural anti-inflammatory   helping hair, skin, and nails, as well as reducing stroke and heart attack risk.   It helps your bones and helps with mood.  It also decreases numerous cancer risks so please take it as directed.   Low Vit D is associated with a 200-300% higher risk for CANCER   and 200-300% higher risk for HEART   ATTACK  &  STROKE.   ......................................  It is also associated with higher death rate at younger ages,   autoimmune diseases like Rheumatoid arthritis, Lupus, Multiple Sclerosis.     Also many other serious conditions, like depression, Alzheimer's  Dementia, infertility, muscle aches, fatigue, fibromyalgia - just to name a few.  ++++++++++++++++++++++++++++++++++++++++++++++++  Recommend the book "The END of DIETING" by Dr Joel Fuhrman   & the book "The END of DIABETES " by Dr Joel Fuhrman  At [employer] - get book & Audio CD's     Being diabetic has a  300% increased risk for heart attack, stroke, cancer, and alzheimer- type vascular dementia. It is very important that you work harder with diet by avoiding all foods that are white. Avoid white rice (brown & wild rice is OK), white potatoes (sweetpotatoes in moderation is OK), White bread or wheat bread or anything made out of white flour like bagels, donuts, rolls, buns, biscuits, cakes, pastries, cookies, pizza crust, and pasta (made from white flour & egg whites) - vegetarian pasta or spinach or wheat pasta is OK. Multigrain breads like Arnold's or [employer], or multigrain sandwich thins or flatbreads.  Diet,  exercise and weight loss can reverse and cure diabetes in the early stages.  Diet, exercise and weight loss is very important in the control and prevention of complications of diabetes which affects every system in your body, ie. Brain - dementia/stroke, eyes - glaucoma/blindness, heart - heart attack/heart failure, kidneys - dialysis, stomach - gastric paralysis, intestines - malabsorption, nerves - severe painful neuritis, circulation - gangrene & loss of a leg(s), and finally cancer and Alzheimers.    I recommend avoid fried & greasy foods,  sweets/candy, white rice (brown or wild rice or Quinoa is OK), white potatoes (sweet potatoes are OK) - anything made from white flour - bagels, doughnuts, rolls, buns, biscuits,white and wheat breads, pizza crust and traditional pasta made of white flour & egg white(vegetarian pasta or spinach or wheat pasta is OK).  Multi-grain bread is OK - like multi-grain flat bread or sandwich thins. Avoid alcohol in excess. Exercise is also important.    Eat all the vegetables you want - avoid meat, especially red meat and dairy - especially cheese.  Cheese is the most concentrated form of trans-fats which is the worst thing to clog up our arteries. Veggie cheese is OK which can be found in the fresh produce section at [employer] or [employer] or [employer]  ++++++++++++++++++++++++++++++++++++++++++++++++++ DASH Eating Plan  DASH stands for "Dietary Approaches to Stop Hypertension."   The DASH eating plan is a healthy eating plan that has been shown to reduce high   blood pressure (hypertension). Additional health benefits may include reducing the risk of type 2 diabetes mellitus, heart disease, and stroke. The DASH eating plan may also help with weight loss.  WHAT DO I NEED TO KNOW ABOUT THE DASH EATING PLAN? For the DASH eating plan, you will follow these general guidelines:  Choose foods with a percent daily value for sodium of less than 5% (as listed on the food  label).  Use salt-free seasonings or herbs instead of table salt or sea salt.  Check with your health care provider or pharmacist before using salt substitutes.  Eat lower-sodium products, often labeled as "lower sodium" or "no salt added."  Eat fresh foods.  Eat more vegetables, fruits, and low-fat dairy products.    Choose whole grains. Look for the word "whole" as the first word in the ingredient list.  Choose fish   Limit sweets, desserts, sugars, and sugary drinks.  Choose heart-healthy fats.  Eat veggie cheese   Eat more home-cooked food and less restaurant, buffet, and fast food.  Limit fried foods.  Cook foods using methods other than frying.  Limit canned vegetables. If you do use them, rinse them well to decrease the sodium.  When eating at a restaurant, ask that your food be prepared with less salt, or no salt if possible.                      WHAT FOODS CAN I EAT?  Seek help from a dietitian for individual calorie needs. Grains Whole grain or whole wheat bread. Brown rice. Whole grain or whole wheat pasta. Quinoa, bulgur, and whole grain cereals. Low-sodium cereals. Corn or whole wheat flour tortillas. Whole grain cornbread. Whole grain crackers. Low-sodium crackers.  Vegetables Fresh or frozen vegetables (raw, steamed, roasted, or grilled). Low-sodium or reduced-sodium tomato and vegetable juices. Low-sodium or reduced-sodium tomato sauce and paste. Low-sodium or reduced-sodium canned vegetables.   Fruits All fresh, canned (in natural juice), or frozen fruits.  Meat and Other Protein Products  All fish and seafood.  Dried beans, peas, or lentils. Unsalted nuts and seeds. Unsalted canned beans. Dairy Low-fat dairy products, such as skim or 1% milk, 2% or reduced-fat cheeses, low-fat ricotta or cottage cheese, or plain low-fat yogurt. Low-sodium or reduced-sodium cheeses.  Fats and Oils Tub margarines without trans fats. Light or reduced-fat mayonnaise  and salad dressings (reduced sodium). Avocado. Safflower, olive, or canola oils. Natural peanut or almond butter.  Other Unsalted popcorn and pretzels. The items listed above may not be a complete list of recommended foods or beverages. Contact your dietitian for more options.  +++++++++++++++++++++++++++++++++++++++++++  WHAT FOODS ARE NOT RECOMMENDED?  Grains/ White flour or wheat flour  White bread. White pasta. White rice. Refined cornbread. Bagels and croissants. Crackers that contain trans fat.  Vegetables  Creamed or fried vegetables. Vegetables in a . Regular canned vegetables. Regular canned tomato sauce and paste. Regular tomato and vegetable juices.  Fruits Dried fruits. Canned fruit in light or heavy syrup. Fruit juice.  Meat and Other Protein Products Meat in general. Fatty cuts of meat. Ribs, chicken wings, bacon, sausage, bologna, salami, chitterlings, fatback, hot dogs, bratwurst, and packaged luncheon meats. Salted nuts and seeds. Canned beans with salt.  Dairy Whole or 2% milk, cream, half-and-half, and cream cheese. Whole-fat or sweetened yogurt. Full-fat cheeses or blue cheese. Nondairy creamers and whipped toppings. Processed cheese, cheese spreads, or cheese curds.  Condiments Onion and garlic salt, seasoned salt, table salt, and sea   salt. Canned and packaged gravies. Worcestershire sauce. Tartar sauce. Barbecue sauce. Teriyaki sauce. Soy sauce, including reduced sodium. Steak sauce. Fish sauce. Oyster sauce. Cocktail sauce. Horseradish. Ketchup and mustard. Meat flavorings and tenderizers. Bouillon cubes. Hot sauce. Tabasco sauce. Marinades. Taco seasonings. Relishes.  Fats and Oils Butter, stick margarine, lard, shortening, ghee, and bacon fat. Coconut, palm kernel, or palm oils. Regular salad dressings.  Pickles and olives. Salted popcorn and pretzels. The items listed above may not be a complete list of foods and beverages to avoid.   Preventive Care for  Adults  A healthy lifestyle and preventive care can promote health and wellness. Preventive health guidelines for men include the following key practices:  A routine yearly physical is a good way to check with your health care provider about your health and preventative screening. It is a chance to share any concerns and updates on your health and to receive a thorough exam.  Visit your dentist for a routine exam and preventative care every 6 months. Brush your teeth twice a day and floss once a day. Good oral hygiene prevents tooth decay and gum disease.  The frequency of eye exams is based on your age, health, family medical history, use of contact lenses, and other factors. Follow your health care provider's recommendations for frequency of eye exams.  Eat a healthy diet. Foods such as vegetables, fruits, whole grains, low-fat dairy products, and lean protein foods contain the nutrients you need without too many calories. Decrease your intake of foods high in solid fats, added sugars, and salt. Eat the right amount of calories for you.Get information about a proper diet from your health care provider, if necessary.  Regular physical exercise is one of the most important things you can do for your health. Most adults should get at least 150 minutes of moderate-intensity exercise (any activity that increases your heart rate and causes you to sweat) each week. In addition, most adults need muscle-strengthening exercises on 2 or more days a week.  Maintain a healthy weight. The body mass index (BMI) is a screening tool to identify possible weight problems. It provides an estimate of body fat based on height and weight. Your health care provider can find your BMI and can help you achieve or maintain a healthy weight.For adults 20 years and older:  A BMI below 18.5 is considered underweight.  A BMI of 18.5 to 24.9 is normal.  A BMI of 25 to 29.9 is considered overweight.  A BMI of 30 and above  is considered obese.  Maintain normal blood lipids and cholesterol levels by exercising and minimizing your intake of saturated fat. Eat a balanced diet with plenty of fruit and vegetables. Blood tests for lipids and cholesterol should begin at age 20 and be repeated every 5 years. If your lipid or cholesterol levels are high, you are over 50, or you are at high risk for heart disease, you may need your cholesterol levels checked more frequently.Ongoing high lipid and cholesterol levels should be treated with medicines if diet and exercise are not working.  If you smoke, find out from your health care provider how to quit. If you do not use tobacco, do not start.  Lung cancer screening is recommended for adults aged 55-80 years who are at high risk for developing lung cancer because of a history of smoking. A yearly low-dose CT scan of the lungs is recommended for people who have at least a 30-pack-year history of smoking and are a   current smoker or have quit within the past 15 years. A pack year of smoking is smoking an average of 1 pack of cigarettes a day for 1 year (for example: 1 pack a day for 30 years or 2 packs a day for 15 years). Yearly screening should continue until the smoker has stopped smoking for at least 15 years. Yearly screening should be stopped for people who develop a health problem that would prevent them from having lung cancer treatment.  If you choose to drink alcohol, do not have more than 2 drinks per day. One drink is considered to be 12 ounces (355 mL) of beer, 5 ounces (148 mL) of wine, or 1.5 ounces (44 mL) of liquor.  Avoid use of street drugs. Do not share needles with anyone. Ask for help if you need support or instructions about stopping the use of drugs.  High blood pressure causes heart disease and increases the risk of stroke. Your blood pressure should be checked at least every 1-2 years. Ongoing high blood pressure should be treated with medicines, if weight loss  and exercise are not effective.  If you are 45-79 years old, ask your health care provider if you should take aspirin to prevent heart disease.  Diabetes screening involves taking a blood sample to check your fasting blood sugar level. This should be done once every 3 years, after age 45, if you are within normal weight and without risk factors for diabetes. Testing should be considered at a younger age or be carried out more frequently if you are overweight and have at least 1 risk factor for diabetes.  Colorectal cancer can be detected and often prevented. Most routine colorectal cancer screening begins at the age of 50 and continues through age 75. However, your health care provider may recommend screening at an earlier age if you have risk factors for colon cancer. On a yearly basis, your health care provider may provide home test kits to check for hidden blood in the stool. Use of a small camera at the end of a tube to directly examine the colon (sigmoidoscopy or colonoscopy) can detect the earliest forms of colorectal cancer. Talk to your health care provider about this at age 50, when routine screening begins. Direct exam of the colon should be repeated every 5-10 years through age 75, unless early forms of precancerous polyps or small growths are found.   Talk with your health care provider about prostate cancer screening.  Testicular cancer screening isrecommended for adult males. Screening includes self-exam, a health care provider exam, and other screening tests. Consult with your health care provider about any symptoms you have or any concerns you have about testicular cancer.  Use sunscreen. Apply sunscreen liberally and repeatedly throughout the day. You should seek shade when your shadow is shorter than you. Protect yourself by wearing long sleeves, pants, a wide-brimmed hat, and sunglasses year round, whenever you are outdoors.  Once a month, do a whole-body skin exam, using a mirror to  look at the skin on your back. Tell your health care provider about new moles, moles that have irregular borders, moles that are larger than a pencil eraser, or moles that have changed in shape or color.  Stay current with required vaccines (immunizations).  Influenza vaccine. All adults should be immunized every year.  Tetanus, diphtheria, and acellular pertussis (Td, Tdap) vaccine. An adult who has not previously received Tdap or who does not know his vaccine status should receive 1 dose of   Tdap. This initial dose should be followed by tetanus and diphtheria toxoids (Td) booster doses every 10 years. Adults with an unknown or incomplete history of completing a 3-dose immunization series with Td-containing vaccines should begin or complete a primary immunization series including a Tdap dose. Adults should receive a Td booster every 10 years.  Varicella vaccine. An adult without evidence of immunity to varicella should receive 2 doses or a second dose if he has previously received 1 dose.  Human papillomavirus (HPV) vaccine. Males aged 13-21 years who have not received the vaccine previously should receive the 3-dose series. Males aged 22-26 years may be immunized. Immunization is recommended through the age of 26 years for any male who has sex with males and did not get any or all doses earlier. Immunization is recommended for any person with an immunocompromised condition through the age of 26 years if he did not get any or all doses earlier. During the 3-dose series, the second dose should be obtained 4-8 weeks after the first dose. The third dose should be obtained 24 weeks after the first dose and 16 weeks after the second dose.  Zoster vaccine. One dose is recommended for adults aged 60 years or older unless certain conditions are present.    PREVNAR  - Pneumococcal 13-valent conjugate (PCV13) vaccine. When indicated, a person who is uncertain of his immunization history and has no record of  immunization should receive the PCV13 vaccine. An adult aged 19 years or older who has certain medical conditions and has not been previously immunized should receive 1 dose of PCV13 vaccine. This PCV13 should be followed with a dose of pneumococcal polysaccharide (PPSV23) vaccine. The PPSV23 vaccine dose should be obtained at least 8 weeks after the dose of PCV13 vaccine. An adult aged 19 years or older who has certain medical conditions and previously received 1 or more doses of PPSV23 vaccine should receive 1 dose of PCV13. The PCV13 vaccine dose should be obtained 1 or more years after the last PPSV23 vaccine dose.    PNEUMOVAX - Pneumococcal polysaccharide (PPSV23) vaccine. When PCV13 is also indicated, PCV13 should be obtained first. All adults aged 65 years and older should be immunized. An adult younger than age 65 years who has certain medical conditions should be immunized. Any person who resides in a nursing home or long-term care facility should be immunized. An adult smoker should be immunized. People with an immunocompromised condition and certain other conditions should receive both PCV13 and PPSV23 vaccines. People with human immunodeficiency virus (HIV) infection should be immunized as soon as possible after diagnosis. Immunization during chemotherapy or radiation therapy should be avoided. Routine use of PPSV23 vaccine is not recommended for American Indians, [place] Natives, or people younger than 65 years unless there are medical conditions that require PPSV23 vaccine. When indicated, people who have unknown immunization and have no record of immunization should receive PPSV23 vaccine. One-time revaccination 5 years after the first dose of PPSV23 is recommended for people aged 19-64 years who have chronic kidney failure, nephrotic syndrome, asplenia, or immunocompromised conditions. People who received 1-2 doses of PPSV23 before age 65 years should receive another dose of PPSV23 vaccine at age  65 years or later if at least 5 years have passed since the previous dose. Doses of PPSV23 are not needed for people immunized with PPSV23 at or after age 65 years.    Hepatitis A vaccine. Adults who wish to be protected from this disease, have certain high-risk conditions,   work with hepatitis A-infected animals, work in hepatitis A research labs, or travel to or work in countries with a high rate of hepatitis A should be immunized. Adults who were previously unvaccinated and who anticipate close contact with an international adoptee during the first 60 days after arrival in the [place] from a country with a high rate of hepatitis A should be immunized.    Hepatitis B vaccine. Adults should be immunized if they wish to be protected from this disease, have certain high-risk conditions, may be exposed to blood or other infectious body fluids, are household contacts or sex partners of hepatitis B positive people, are clients or workers in certain care facilities, or travel to or work in countries with a high rate of hepatitis B.   Preventive Service / Frequency   Ages 40 to 64  Blood pressure check.  Lipid and cholesterol check  Lung cancer screening. / Every year if you are aged 55-80 years and have a 30-pack-year history of smoking and currently smoke or have quit within the past 15 years. Yearly screening is stopped once you have quit smoking for at least 15 years or develop a health problem that would prevent you from having lung cancer treatment.  Fecal occult blood test (FOBT) of stool. / Every year beginning at age 50 and continuing until age 75. You may not have to do this test if you get a colonoscopy every 10 years.  Flexible sigmoidoscopy** or colonoscopy.** / Every 5 years for a flexible sigmoidoscopy or every 10 years for a colonoscopy beginning at age 50 and continuing until age 75. Screening for abdominal aortic aneurysm (AAA)  by ultrasound is recommended for people who  have history of high blood pressure or who are current or former smokers. 

## 2015-06-29 ENCOUNTER — Other Ambulatory Visit: Payer: Self-pay | Admitting: Internal Medicine

## 2015-06-29 DIAGNOSIS — E782 Mixed hyperlipidemia: Secondary | ICD-10-CM

## 2015-06-29 LAB — URINALYSIS, ROUTINE W REFLEX MICROSCOPIC
Bilirubin Urine: NEGATIVE
GLUCOSE, UA: NEGATIVE
Hgb urine dipstick: NEGATIVE
KETONES UR: NEGATIVE
LEUKOCYTES UA: NEGATIVE
NITRITE: NEGATIVE
PH: 5.5 (ref 5.0–8.0)
Protein, ur: NEGATIVE
SPECIFIC GRAVITY, URINE: 1.008 (ref 1.001–1.035)

## 2015-06-29 LAB — HEPATIC FUNCTION PANEL
ALK PHOS: 76 U/L (ref 40–115)
ALT: 16 U/L (ref 9–46)
AST: 21 U/L (ref 10–40)
Albumin: 4.1 g/dL (ref 3.6–5.1)
BILIRUBIN DIRECT: 0.1 mg/dL (ref <=0.2)
BILIRUBIN INDIRECT: 0.5 mg/dL (ref 0.2–1.2)
Total Bilirubin: 0.6 mg/dL (ref 0.2–1.2)
Total Protein: 6.8 g/dL (ref 6.1–8.1)

## 2015-06-29 LAB — BASIC METABOLIC PANEL WITH GFR
BUN: 10 mg/dL (ref 7–25)
CALCIUM: 9.6 mg/dL (ref 8.6–10.3)
CO2: 29 mmol/L (ref 20–31)
Chloride: 99 mmol/L (ref 98–110)
Creat: 1.13 mg/dL (ref 0.60–1.35)
GFR, EST AFRICAN AMERICAN: 88 mL/min (ref >=60)
GFR, EST NON AFRICAN AMERICAN: 76 mL/min (ref >=60)
Glucose, Bld: 92 mg/dL (ref 65–99)
POTASSIUM: 4.3 mmol/L (ref 3.5–5.3)
SODIUM: 141 mmol/L (ref 135–146)

## 2015-06-29 LAB — CBC WITH DIFFERENTIAL/PLATELET
BASOS ABS: 0.1 10*3/uL (ref 0.0–0.1)
Basophils Relative: 1 % (ref 0–1)
Eosinophils Absolute: 0.6 10*3/uL (ref 0.0–0.7)
Eosinophils Relative: 9 % — ABNORMAL HIGH (ref 0–5)
HCT: 45.8 % (ref 39.0–52.0)
Hemoglobin: 15.8 g/dL (ref 13.0–17.0)
LYMPHS ABS: 2.3 10*3/uL (ref 0.7–4.0)
LYMPHS PCT: 34 % (ref 12–46)
MCH: 29.9 pg (ref 26.0–34.0)
MCHC: 34.5 g/dL (ref 30.0–36.0)
MCV: 86.6 fL (ref 78.0–100.0)
MONOS PCT: 6 % (ref 3–12)
MPV: 10.1 fL (ref 8.6–12.4)
Monocytes Absolute: 0.4 10*3/uL (ref 0.1–1.0)
NEUTROS PCT: 50 % (ref 43–77)
Neutro Abs: 3.4 10*3/uL (ref 1.7–7.7)
Platelets: 171 10*3/uL (ref 150–400)
RBC: 5.29 MIL/uL (ref 4.22–5.81)
RDW: 14.9 % (ref 11.5–15.5)
WBC: 6.8 10*3/uL (ref 4.0–10.5)

## 2015-06-29 LAB — IRON AND TIBC
%SAT: 32 % (ref 15–60)
IRON: 120 ug/dL (ref 50–180)
TIBC: 370 ug/dL (ref 250–425)
UIBC: 250 ug/dL (ref 125–400)

## 2015-06-29 LAB — TESTOSTERONE: TESTOSTERONE: 465 ng/dL (ref 300–890)

## 2015-06-29 LAB — MAGNESIUM: Magnesium: 1.8 mg/dL (ref 1.5–2.5)

## 2015-06-29 LAB — LIPID PANEL
CHOL/HDL RATIO: 4.7 ratio (ref <=5.0)
CHOLESTEROL: 233 mg/dL — AB (ref 125–200)
HDL: 50 mg/dL (ref >=40)
LDL CALC: 136 mg/dL — AB (ref <130)
TRIGLYCERIDES: 234 mg/dL — AB (ref <150)
VLDL: 47 mg/dL — AB (ref <30)

## 2015-06-29 LAB — MICROALBUMIN / CREATININE URINE RATIO
Creatinine, Urine: 34 mg/dL (ref 20–370)
MICROALB UR: 0.5 mg/dL
MICROALB/CREAT RATIO: 15 ug/mg{creat} (ref <30)

## 2015-06-29 LAB — INSULIN, RANDOM: Insulin: 5.7 u[IU]/mL (ref 2.0–19.6)

## 2015-06-29 LAB — HEMOGLOBIN A1C
Hgb A1c MFr Bld: 5.2 % (ref <5.7)
Mean Plasma Glucose: 103 mg/dL (ref <117)

## 2015-06-29 LAB — VITAMIN B12: Vitamin B-12: 562 pg/mL (ref 211–911)

## 2015-06-29 LAB — TSH: TSH: 0.926 u[IU]/mL (ref 0.350–4.500)

## 2015-06-29 LAB — VITAMIN D 25 HYDROXY (VIT D DEFICIENCY, FRACTURES): Vit D, 25-Hydroxy: 28 ng/mL — ABNORMAL LOW (ref 30–100)

## 2015-06-29 LAB — PSA: PSA: 0.69 ng/mL (ref <=4.00)

## 2015-06-29 MED ORDER — ROSUVASTATIN CALCIUM 40 MG PO TABS: ORAL_TABLET | ORAL | Status: DC

## 2015-07-07 ENCOUNTER — Encounter: Payer: Self-pay | Admitting: Internal Medicine

## 2015-07-07 DIAGNOSIS — E663 Overweight: Secondary | ICD-10-CM | POA: Insufficient documentation

## 2015-07-07 MED ORDER — ATENOLOL 100 MG PO TABS: 100.0000 mg | ORAL_TABLET | Freq: Every day | ORAL | Status: DC

## 2015-07-11 ENCOUNTER — Encounter: Payer: Self-pay | Admitting: Internal Medicine

## 2015-07-11 ENCOUNTER — Ambulatory Visit (INDEPENDENT_AMBULATORY_CARE_PROVIDER_SITE_OTHER): Payer: BLUE CROSS/BLUE SHIELD | Admitting: Internal Medicine

## 2015-07-11 VITALS — BP 148/88 | HR 66 | Temp 98.2°F | Resp 18 | Ht 67.0 in | Wt 178.0 lb

## 2015-07-11 DIAGNOSIS — I10 Essential (primary) hypertension: Secondary | ICD-10-CM

## 2015-07-11 NOTE — Progress Notes (Signed)
  Subjective:    Patient ID: Grant Campbell, male    DOB: 1966/04/17, 49 y.o.   MRN: [id number]  HPI this nice 49 yo MWM returning for 2 week f/u of elevated BP at 184/120 at last OV and patient was started on Atenolol 100 mg x 1/2 = 50 mg. BP's at home have been normal w/o c/o HA, dizziness, CP, palpitations, dyspnea od dependent edema.   Medication Sig  . ALPRAZolam  0.5 MG tablet TAKE 1/2 TO 1 TABLET UP TO 3 TIMES A DAY AS NEEDED FOR ANXIETY.  . Aspirin-Acetaminophen-Caffeine (GOODY HEADACHE) Take 1 Package by mouth 2 (two) times daily as needed (for pain).  Marland Kitchen. atenolol 100 MG tablet Take 1 tablet (100 mg total) by mouth daily.  . pantoprazole 40 MG tablet Take 1 tablet (40 mg total) by mouth daily.  . rosuvastatin  40 MG tablet Take 1/2 to 1 tablet daily or as directed for cholesterol  . sildenafil  100 MG tablet Take 1 tablet (100 mg total) by mouth as needed for erectile dysfunction.  . traMADol 50 MG tablet Take 1 tablet (50 mg total) by mouth every 6 (six) hours as needed for pain.  . vitamin C  500 MG tablet Take 500 mg by mouth every morning.   Allergies  Allergen Reactions  . Vicodin [Hydrocodone-Acetaminophen] Other (See Comments)    "makes my skin crawl"   Past Medical History  Diagnosis Date  . Hyperlipidemia   . Hypertension   . Hypogonadism male   . Vitamin D deficiency    Review of Systems  10 point systems review negative except as above.    Objective:   Physical Exam  BP 148/88 mmHg  Pulse 66  Temp(Src) 98.2 F (36.8 C) (Temporal)  Resp 18  Ht 5\' 7"  (1.702 m)  Wt 178 lb (80.74 kg)  BMI 27.87 kg/m2  HEENT - Eac's patent. TM's Nl. EOM's full. PERRLA. NasoOroPharynx clear. Neck - supple. Nl Thyroid. Carotids 2+ & No bruits, nodes, JVD Chest - Clear equal BS w/o Rales, rhonchi, wheezes. Cor - Nl HS. RRR w/o sig MGR. PP 1(+). No edema. Abd - No palpable organomegaly, masses or tenderness. BS nl. MS- FROM w/o deformities. Muscle power, tone and bulk Nl. Gait  Nl. Neuro - No obvious Cr N abnormalities. Sensory, motor and Cerebellar functions appear Nl w/o focal abnormalities. Psyche - Mental status normal & appropriate.  No delusions, ideations or obvious mood abnormalities.    Assessment & Plan:   1. Essential hypertension  - Continue meds same, discussed meds/SE's.

## 2015-09-18 ENCOUNTER — Ambulatory Visit: Payer: Self-pay | Admitting: Internal Medicine

## 2015-10-02 ENCOUNTER — Ambulatory Visit: Payer: Self-pay | Admitting: Internal Medicine

## 2015-10-10 ENCOUNTER — Ambulatory Visit: Payer: Self-pay | Admitting: Internal Medicine

## 2015-10-18 ENCOUNTER — Other Ambulatory Visit: Payer: Self-pay | Admitting: Internal Medicine

## 2015-10-18 DIAGNOSIS — IMO0001 Reserved for inherently not codable concepts without codable children: Secondary | ICD-10-CM

## 2015-10-18 DIAGNOSIS — N528 Other male erectile dysfunction: Secondary | ICD-10-CM

## 2016-01-03 ENCOUNTER — Ambulatory Visit: Payer: Self-pay | Admitting: Internal Medicine

## 2016-04-28 ENCOUNTER — Other Ambulatory Visit: Payer: Self-pay | Admitting: Internal Medicine

## 2016-04-28 DIAGNOSIS — IMO0001 Reserved for inherently not codable concepts without codable children: Secondary | ICD-10-CM

## 2016-07-31 ENCOUNTER — Ambulatory Visit (INDEPENDENT_AMBULATORY_CARE_PROVIDER_SITE_OTHER): Payer: BLUE CROSS/BLUE SHIELD | Admitting: Internal Medicine

## 2016-07-31 ENCOUNTER — Other Ambulatory Visit: Payer: Self-pay | Admitting: *Deleted

## 2016-07-31 VITALS — BP 124/96 | HR 80 | Temp 97.5°F | Resp 16 | Ht 67.0 in | Wt 173.4 lb

## 2016-07-31 DIAGNOSIS — Z0001 Encounter for general adult medical examination with abnormal findings: Secondary | ICD-10-CM

## 2016-07-31 DIAGNOSIS — I10 Essential (primary) hypertension: Secondary | ICD-10-CM | POA: Diagnosis not present

## 2016-07-31 DIAGNOSIS — Z125 Encounter for screening for malignant neoplasm of prostate: Secondary | ICD-10-CM

## 2016-07-31 DIAGNOSIS — R7303 Prediabetes: Secondary | ICD-10-CM

## 2016-07-31 DIAGNOSIS — Z111 Encounter for screening for respiratory tuberculosis: Secondary | ICD-10-CM

## 2016-07-31 DIAGNOSIS — R5383 Other fatigue: Secondary | ICD-10-CM

## 2016-07-31 DIAGNOSIS — E559 Vitamin D deficiency, unspecified: Secondary | ICD-10-CM | POA: Diagnosis not present

## 2016-07-31 DIAGNOSIS — Z136 Encounter for screening for cardiovascular disorders: Secondary | ICD-10-CM

## 2016-07-31 DIAGNOSIS — K219 Gastro-esophageal reflux disease without esophagitis: Secondary | ICD-10-CM

## 2016-07-31 DIAGNOSIS — Z79899 Other long term (current) drug therapy: Secondary | ICD-10-CM | POA: Diagnosis not present

## 2016-07-31 DIAGNOSIS — Z0000 Encounter for general adult medical examination without abnormal findings: Secondary | ICD-10-CM

## 2016-07-31 DIAGNOSIS — E782 Mixed hyperlipidemia: Secondary | ICD-10-CM

## 2016-07-31 LAB — CBC WITH DIFFERENTIAL/PLATELET
BASOS PCT: 1 %
Basophils Absolute: 78 cells/uL (ref 0–200)
EOS PCT: 10 %
Eosinophils Absolute: 780 cells/uL — ABNORMAL HIGH (ref 15–500)
HCT: 44.8 % (ref 38.5–50.0)
Hemoglobin: 15.6 g/dL (ref 13.2–17.1)
LYMPHS PCT: 25 %
Lymphs Abs: 1950 cells/uL (ref 850–3900)
MCH: 28.9 pg (ref 27.0–33.0)
MCHC: 34.8 g/dL (ref 32.0–36.0)
MCV: 83 fL (ref 80.0–100.0)
MONOS PCT: 4 %
MPV: 10.1 fL (ref 7.5–12.5)
Monocytes Absolute: 312 cells/uL (ref 200–950)
NEUTROS PCT: 60 %
Neutro Abs: 4680 cells/uL (ref 1500–7800)
PLATELETS: 163 10*3/uL (ref 140–400)
RBC: 5.4 MIL/uL (ref 4.20–5.80)
RDW: 14 % (ref 11.0–15.0)
WBC: 7.8 10*3/uL (ref 3.8–10.8)

## 2016-07-31 LAB — HEPATIC FUNCTION PANEL
ALT: 13 U/L (ref 9–46)
AST: 16 U/L (ref 10–35)
Albumin: 4.1 g/dL (ref 3.6–5.1)
Alkaline Phosphatase: 71 U/L (ref 40–115)
BILIRUBIN INDIRECT: 0.3 mg/dL (ref 0.2–1.2)
BILIRUBIN TOTAL: 0.4 mg/dL (ref 0.2–1.2)
Bilirubin, Direct: 0.1 mg/dL (ref <=0.2)
TOTAL PROTEIN: 6.4 g/dL (ref 6.1–8.1)

## 2016-07-31 LAB — LIPID PANEL
CHOLESTEROL: 270 mg/dL — AB (ref <200)
HDL: 29 mg/dL — AB (ref >40)
LDL Cholesterol: 202 mg/dL — ABNORMAL HIGH (ref <100)
TRIGLYCERIDES: 194 mg/dL — AB (ref <150)
Total CHOL/HDL Ratio: 9.3 Ratio — ABNORMAL HIGH (ref <5.0)
VLDL: 39 mg/dL — AB (ref <30)

## 2016-07-31 LAB — BASIC METABOLIC PANEL WITH GFR
BUN: 14 mg/dL (ref 7–25)
CALCIUM: 9.1 mg/dL (ref 8.6–10.3)
CO2: 24 mmol/L (ref 20–31)
Chloride: 105 mmol/L (ref 98–110)
Creat: 1.25 mg/dL (ref 0.70–1.33)
GFR, EST AFRICAN AMERICAN: 77 mL/min (ref >=60)
GFR, EST NON AFRICAN AMERICAN: 67 mL/min (ref >=60)
GLUCOSE: 111 mg/dL — AB (ref 65–99)
POTASSIUM: 3.9 mmol/L (ref 3.5–5.3)
SODIUM: 139 mmol/L (ref 135–146)

## 2016-07-31 LAB — MAGNESIUM: Magnesium: 1.8 mg/dL (ref 1.5–2.5)

## 2016-07-31 LAB — IRON AND TIBC
%SAT: 22 % (ref 15–60)
Iron: 67 ug/dL (ref 50–180)
TIBC: 311 ug/dL (ref 250–425)
UIBC: 244 ug/dL (ref 125–400)

## 2016-07-31 MED ORDER — LISINOPRIL 20 MG PO TABS: 20.0000 mg | ORAL_TABLET | Freq: Every day | ORAL | 1 refills | Status: DC

## 2016-07-31 NOTE — Progress Notes (Signed)
[facility]   Grant Campbell, M.D.    Dyanne Carrel. Steffanie Dunn, P.A.-C      Terri Piedra, P.A.-C  [facility]                [address]                [place], [place]. [address] Telephone [phone] Telefax [phone] Annual  Screening/Preventative Visit  & Comprehensive Evaluation & Examination     This very nice 50 y.o. MWM presents for a Screening/Preventative Visit & comprehensive evaluation and management of multiple medical co-morbidities.  Patient has been followed for HTN, Prediabetes, Hyperlipidemia and Vitamin D Deficiency. Patient has hx/o moderate alcohol use and now alleges abstinence on ultimatum from his wife since the last 6 months.  Patient has GERD controlled with diet and alcohol abstinence.      Patient has hx/o HTN predating circa 2010. Patient's BP has been controlled at home altho today's BP is elevated at 124/96. Patient denies any cardiac symptoms as chest pain, palpitations, shortness of breath, dizziness or ankle swelling.     Patient's hyperlipidemia is controlled with diet and medications. Patient denies myalgias or other medication SE's. Last lipids were not at goal and patient declined to take his prescribed Crestor for fear of side-effects: Lab Results  Component Value Date   CHOL 233 (H) 06/28/2015   HDL 50 06/28/2015   LDLCALC 136 (H) 06/28/2015   TRIG 234 (H) 06/28/2015   CHOLHDL 4.7 06/28/2015      Patient has prediabetes with A1c 5.7% since in 2010  And then 5.1% in 2013 and patient denies reactive hypoglycemic symptoms, visual blurring, diabetic polys or paresthesias. Last A1c was at goal: Lab Results  Component Value Date   HGBA1C 5.2 06/28/2015       Finally, patient has history of Vitamin D Deficiency in 2010  of "35" and last vitamin D was still low as patient declines to follow recommendations: Lab Results  Component Value Date   VD25OH 28 (L) 06/28/2015   Current  Outpatient Prescriptions on File Prior to Visit  Medication Sig  . ALPRAZolam (XANAX) 0.5 MG tablet TAKE 1/2 TO 1 TABLET UP TO 3 TIMES A DAY AS NEEDED FOR ANXIETY.  . Aspirin-Acetaminophen-Caffeine (GOODY HEADACHE PO) Take 1 Package by mouth 2 (two) times daily as needed (for pain).  . traMADol (ULTRAM) 50 MG tablet Take 1 tablet (50 mg total) by mouth every 6 (six) hours as needed for pain.  Marland Kitchen VIAGRA 100 MG tablet TAKE 1/2 TO 1 TABLET AS NEEDED.  Marland Kitchen vitamin C (ASCORBIC ACID) 500 MG tablet Take 500 mg by mouth every morning.  . pantoprazole (PROTONIX) 40 MG tablet Take 1 tablet (40 mg total) by mouth daily.   No current facility-administered medications on file prior to visit.    Allergies  Allergen Reactions  . Vicodin [Hydrocodone-Acetaminophen] Other (See Comments)    "makes my skin crawl"   Past Medical History:  Diagnosis Date  . Hyperlipidemia   . Hypertension   . Hypogonadism male   . Vitamin D deficiency    Health Maintenance  Topic Date Due  . HIV Screening  11/15/1980  . COLONOSCOPY  11/16/2015  . INFLUENZA VACCINE  04/01/2016  . TETANUS/TDAP  07/31/2026   Immunization History  Administered Date(s) Administered  . DTaP 09/02/2007  . PPD Test 06/19/2014, 06/28/2015, 07/31/2016   Past Surgical History:  Procedure Laterality Date  . FINGER SURGERY    .  ROTATOR CUFF REPAIR     Family History  Problem Relation Age of Onset  . Heart attack Mother   . Kidney failure Father   . Hypertension Brother   . Hypothyroidism Brother   . Hyperlipidemia Brother    Social History   Social History  . Marital status: Married    Spouse name: N/A  . Number of children: 0   Occupational History  . Engineer, maintenanceConstruction project supervisor    Social History Main Topics  . Smoking status: Current Every Day Smoker    Packs/day: 1.00    Types: Cigarettes  . Smokeless tobacco: Not on file  . Alcohol use 4.2 oz/week    7 Standard drinks or equivalent per week     Comment: occasional   . Drug use: No  . Sexual activity: Active    ROS Constitutional: Denies fever, chills, weight loss/gain, headaches, insomnia,  night sweats or change in appetite. Does c/o fatigue. Eyes: Denies redness, blurred vision, diplopia, discharge, itchy or watery eyes.  ENT: Denies discharge, congestion, post nasal drip, epistaxis, sore throat, earache, hearing loss, dental pain, Tinnitus, Vertigo, Sinus pain or snoring.  Cardio: Denies chest pain, palpitations, irregular heartbeat, syncope, dyspnea, diaphoresis, orthopnea, PND, claudication or edema Respiratory: denies cough, dyspnea, DOE, pleurisy, hoarseness, laryngitis or wheezing.  Gastrointestinal: Denies dysphagia, heartburn, reflux, water brash, pain, cramps, nausea, vomiting, bloating, diarrhea, constipation, hematemesis, melena, hematochezia, jaundice or hemorrhoids Genitourinary: Denies dysuria, frequency, urgency, nocturia, hesitancy, discharge, hematuria or flank pain Musculoskeletal: Denies arthralgia, myalgia, stiffness, Jt. Swelling, pain, limp or strain/sprain. Denies Falls. Skin: Denies puritis, rash, hives, warts, acne, eczema or change in skin lesion Neuro: No weakness, tremor, incoordination, spasms, paresthesia or pain Psychiatric: Denies confusion, memory loss or sensory loss. Denies Depression. Endocrine: Denies change in weight, skin, hair change, nocturia, and paresthesia, diabetic polys, visual blurring or hyper / hypo glycemic episodes.  Heme/Lymph: No excessive bleeding, bruising or enlarged lymph nodes.  Physical Exam  BP (!) 124/96   Pulse 80   Temp 97.5 F (36.4 C)   Resp 16   Ht 5\' 7"  (1.702 m)   Wt 173 lb 6.4 oz (78.7 kg)   BMI 27.16 kg/m   General Appearance: Well nourished, in no apparent distress.  Eyes: PERRLA, EOMs, conjunctiva no swelling or erythema, normal fundi and vessels. Sinuses: No frontal/maxillary tenderness ENT/Mouth: EACs patent / TMs  nl. Nares clear without erythema, swelling, mucoid  exudates. Oral hygiene is good. No erythema, swelling, or exudate. Tongue normal, non-obstructing. Tonsils not swollen or erythematous. Hearing normal.  Neck: Supple, thyroid normal. No bruits, nodes or JVD. Respiratory: Respiratory effort normal.  BS equal and clear bilateral without rales, rhonci, wheezing or stridor. Cardio: Heart sounds are normal with regular rate and rhythm and no murmurs, rubs or gallops. Peripheral pulses are normal and equal bilaterally without edema. No aortic or femoral bruits. Chest: symmetric with normal excursions and percussion.  Abdomen: Soft, with Nl bowel sounds. Nontender, no guarding, rebound, hernias, masses, or organomegaly.  Lymphatics: Non tender without lymphadenopathy.  Genitourinary: No hernias.Testes nl. DRE - prostate nl for age - smooth & firm w/o nodules. Musculoskeletal: Full ROM all peripheral extremities, joint stability, 5/5 strength, and normal gait. Skin: Warm and dry without rashes, lesions, cyanosis, clubbing or  ecchymosis.  Neuro: Cranial nerves intact, reflexes equal bilaterally. Normal muscle tone, no cerebellar symptoms. Sensation intact.  Pysch: Alert and oriented X 3 with normal affect, insight and judgment appropriate.   Assessment and Plan  1. Annual Preventative/Screening  Exam   - Microalbumin / creatinine urine ratio - EKG 12-Lead - Urinalysis, Routine w reflex microscopic - Vitamin B12 - Iron and TIBC - PSA - Testosterone - CBC with Differential/Platelet - BASIC METABOLIC PANEL WITH GFR - Hepatic function panel - Magnesium - Lipid panel - TSH - Hemoglobin A1c - Insulin, random - VITAMIN D 25 Hydroxy - PPD  2. Essential hypertension  - Lisinopril  20 mg #90 x 1 rf  - Microalbumin / creatinine urine ratio - EKG 12-Lead - TSH  3. Hyperlipidemia  - EKG 12-Lead - Lipid panel - TSH  4. Prediabetes  - EKG 12-Lead - Hemoglobin A1c - Insulin, random  5. Vitamin D deficiency  - VITAMIN D 25 Hydroxy    6. Gastroesophageal reflux disease   7. Prostate cancer screening  - PSA  8. Screening for ischemic heart disease  - EKG 12-Lead  9. Screening examination for pulmonary tuberculosis  - PPD  10. Other fatigue  - Vitamin B12 - Iron and TIBC - Testosterone - CBC with Differential/Platelet - TSH  11. Medication management  - Urinalysis, Routine w reflex microscopic  - CBC with Differential/Platelet - BASIC METABOLIC PANEL WITH GFR - Hepatic function panel - Magnesium      After a long discussion on the consequences of untreated HTN, patient did reluctantly finally agree to take meds for BP (Rx Lisinopril)           Continue prudent diet as discussed, weight control, BP monitoring, regular exercise, and medications as discussed.  Discussed med effects and SE's. Routine screening labs and tests as requested with regular follow-up as recommended. Over 40 minutes of exam, counseling, chart review and high complex critical decision making was performed

## 2016-07-31 NOTE — Patient Instructions (Signed)

## 2016-08-01 ENCOUNTER — Encounter: Payer: Self-pay | Admitting: Internal Medicine

## 2016-08-01 LAB — MICROALBUMIN / CREATININE URINE RATIO
CREATININE, URINE: 31 mg/dL (ref 20–370)
Microalb Creat Ratio: 13 mcg/mg creat (ref <30)
Microalb, Ur: 0.4 mg/dL

## 2016-08-01 LAB — URINALYSIS, ROUTINE W REFLEX MICROSCOPIC
BILIRUBIN URINE: NEGATIVE
GLUCOSE, UA: NEGATIVE
Hgb urine dipstick: NEGATIVE
KETONES UR: NEGATIVE
Leukocytes, UA: NEGATIVE
Nitrite: NEGATIVE
Protein, ur: NEGATIVE
SPECIFIC GRAVITY, URINE: 1.007 (ref 1.001–1.035)
pH: 5.5 (ref 5.0–8.0)

## 2016-08-01 LAB — VITAMIN D 25 HYDROXY (VIT D DEFICIENCY, FRACTURES): Vit D, 25-Hydroxy: 30 ng/mL (ref 30–100)

## 2016-08-01 LAB — INSULIN, RANDOM: INSULIN: 27.4 u[IU]/mL — AB (ref 2.0–19.6)

## 2016-08-01 LAB — TESTOSTERONE: TESTOSTERONE: 415 ng/dL (ref 250–827)

## 2016-08-01 LAB — HEMOGLOBIN A1C
Hgb A1c MFr Bld: 5 % (ref <5.7)
MEAN PLASMA GLUCOSE: 97 mg/dL

## 2016-08-01 LAB — PSA: PSA: 0.6 ng/mL (ref <=4.0)

## 2016-08-01 LAB — TSH: TSH: 0.7 mIU/L (ref 0.40–4.50)

## 2016-08-01 LAB — VITAMIN B12: Vitamin B-12: 974 pg/mL (ref 200–1100)

## 2016-08-04 ENCOUNTER — Other Ambulatory Visit: Payer: Self-pay | Admitting: *Deleted

## 2016-08-04 LAB — TB SKIN TEST
Induration: 0 mm
TB SKIN TEST: NEGATIVE

## 2016-08-04 MED ORDER — ALPRAZOLAM 0.5 MG PO TABS: ORAL_TABLET | ORAL | 0 refills | Status: DC

## 2016-08-04 MED ORDER — ATORVASTATIN CALCIUM 80 MG PO TABS: 80.0000 mg | ORAL_TABLET | Freq: Every day | ORAL | 1 refills | Status: DC

## 2016-08-11 ENCOUNTER — Telehealth: Payer: Self-pay | Admitting: *Deleted

## 2016-08-11 MED ORDER — BISOPROLOL-HYDROCHLOROTHIAZIDE 5-6.25 MG PO TABS: 1.0000 | ORAL_TABLET | Freq: Every day | ORAL | 1 refills | Status: DC

## 2016-08-11 NOTE — Telephone Encounter (Signed)
Spouse called and states the patiwnt's BP is having high and low readings since starting the Lisinopril 20 mg.  Per Dr Oneta RackMcKeown, cut the Lisinopril in 1/2 and add an RX of Ziac 5 mg and has been sent to the pharmacy.

## 2016-08-12 ENCOUNTER — Telehealth: Payer: Self-pay | Admitting: *Deleted

## 2016-08-12 NOTE — Telephone Encounter (Signed)
Spoke with the patient's spouse in regard to the new RX sent in for Ziac.  The patient declined to buy and start the RX for Ziac.  Per Dr Oneta RackMcKeown, return to taking a whole tablet of the Lisinopril 20 mg for this BP. Patient's spouse is aware.

## 2016-09-16 ENCOUNTER — Other Ambulatory Visit: Payer: Self-pay | Admitting: *Deleted

## 2016-09-16 MED ORDER — ALPRAZOLAM 0.5 MG PO TABS: ORAL_TABLET | ORAL | 0 refills | Status: DC

## 2016-09-22 ENCOUNTER — Telehealth: Payer: Self-pay | Admitting: *Deleted

## 2016-09-22 NOTE — Telephone Encounter (Signed)
Patient's spouse called and reported the patient is taking Lisinopril in the early AM and Vitamin D 1610910000 units in the middle of the day. Patient becomes nauseated almost every day.  Per Dr Oneta RackMcKeown, the patient can stop the Vitamin D for 2 weeks and see if the nausea stops. He suggest the patient restart the Vitamin D, if the nausea stops, but put the capsules in the freezer and see if he can tolerate it better.  The spouse is aware.

## 2016-11-04 ENCOUNTER — Ambulatory Visit (INDEPENDENT_AMBULATORY_CARE_PROVIDER_SITE_OTHER): Payer: BLUE CROSS/BLUE SHIELD | Admitting: Internal Medicine

## 2016-11-04 ENCOUNTER — Encounter: Payer: Self-pay | Admitting: Internal Medicine

## 2016-11-04 VITALS — BP 122/82 | HR 80 | Temp 97.4°F | Resp 16 | Ht 67.0 in | Wt 170.8 lb

## 2016-11-04 DIAGNOSIS — I10 Essential (primary) hypertension: Secondary | ICD-10-CM | POA: Diagnosis not present

## 2016-11-04 DIAGNOSIS — Z79899 Other long term (current) drug therapy: Secondary | ICD-10-CM

## 2016-11-04 DIAGNOSIS — R7303 Prediabetes: Secondary | ICD-10-CM | POA: Diagnosis not present

## 2016-11-04 DIAGNOSIS — E782 Mixed hyperlipidemia: Secondary | ICD-10-CM

## 2016-11-04 DIAGNOSIS — E559 Vitamin D deficiency, unspecified: Secondary | ICD-10-CM

## 2016-11-04 LAB — CBC WITH DIFFERENTIAL/PLATELET
BASOS PCT: 0 %
Basophils Absolute: 0 cells/uL (ref 0–200)
EOS ABS: 693 {cells}/uL — AB (ref 15–500)
Eosinophils Relative: 7 %
HCT: 42.5 % (ref 38.5–50.0)
HEMOGLOBIN: 14.6 g/dL (ref 13.2–17.1)
LYMPHS ABS: 2871 {cells}/uL (ref 850–3900)
Lymphocytes Relative: 29 %
MCH: 28.9 pg (ref 27.0–33.0)
MCHC: 34.4 g/dL (ref 32.0–36.0)
MCV: 84 fL (ref 80.0–100.0)
MPV: 10.5 fL (ref 7.5–12.5)
Monocytes Absolute: 594 cells/uL (ref 200–950)
Monocytes Relative: 6 %
NEUTROS ABS: 5742 {cells}/uL (ref 1500–7800)
Neutrophils Relative %: 58 %
PLATELETS: 199 10*3/uL (ref 140–400)
RBC: 5.06 MIL/uL (ref 4.20–5.80)
RDW: 15 % (ref 11.0–15.0)
WBC: 9.9 10*3/uL (ref 3.8–10.8)

## 2016-11-04 NOTE — Progress Notes (Signed)
[facility] [name], M.D.        Dyanne Carrel. Steffanie Dunn, P.A.-C       Terri Piedra, P.A.-C  [facility]                [address]                [place], [place]. [address] Telephone [phone] Telefax [phone] ______________________________________________________________________     This very nice 51 y.o. MWM presents for 3 month follow up with Hypertension, Hyperlipidemia, Pre-Diabetes and Vitamin D Deficiency.      Patient has hx/o HTN (2010) and only recently started on medications and initially was intolerant to Ziac, but now is controlled and tolerating Lisinopril. Today's BP is at goal  122/82. Patient has had no complaints of any cardiac type chest pain, palpitations, dyspnea/orthopnea/PND, dizziness, claudication or dependent edema.     Hyperlipidemia is not controlled with diet as he is reticent to taking meds for Cholesterol despite numerous discussions re: importance of Cholesterol control. Patient denies myalgias or other med SE's. Last Lipids were not at goal: Lab Results  Component Value Date   CHOL 270 (H) 07/31/2016   HDL 29 (L) 07/31/2016   LDLCALC 202 (H) 07/31/2016   TRIG 194 (H) 07/31/2016   CHOLHDL 9.3 (H) 07/31/2016      Also, the patient has history of PreDiabetes (A1c 5.7% in 2010 and then 5.1% in 2013) and has had no symptoms of reactive hypoglycemia, diabetic polys, paresthesias or visual blurring.  Last A1c was at goal: Lab Results  Component Value Date   HGBA1C 5.0 07/31/2016      Further, the patient also has history of Vitamin D Deficiency ("35" in 2010) and does not supplement vitamin D as repeatedly advised. Last vitamin D was still very low & not at goal: Lab Results  Component Value Date   VD25OH 30 07/31/2016   Current Outpatient Prescriptions on File Prior to Visit  Medication Sig  . ALPRAZolam (XANAX) 0.5 MG tablet TAKE 1/2 TO 1 TABLET UP TO 3 TIMES A DAY AS  NEEDED FOR ANXIETY.  . Aspirin-Acetaminophen-Caffeine (GOODY HEADACHE PO) Take 1 Package by mouth 2 (two) times daily as needed (for pain).  Marland Kitchen lisinopril (PRINIVIL,ZESTRIL) 20 MG tablet Take 1 tablet (20 mg total) by mouth daily.  Marland Kitchen VIAGRA 100 MG tablet TAKE 1/2 TO 1 TABLET AS NEEDED.  Marland Kitchen vitamin C (ASCORBIC ACID) 500 MG tablet Take 500 mg by mouth every morning.  Marland Kitchen atorvastatin (LIPITOR) 80 MG tablet Take 1 tablet (80 mg total) by mouth daily. (Patient not taking: Reported on 11/04/2016)   No current facility-administered medications on file prior to visit.    Allergies  Allergen Reactions  . Vicodin [Hydrocodone-Acetaminophen] Other (See Comments)    "makes my skin crawl"   PMHx:   Past Medical History:  Diagnosis Date  . Hyperlipidemia   . Hypertension   . Hypogonadism male   . Vitamin D deficiency    Immunization History  Administered Date(s) Administered  . DTaP 09/02/2007  . PPD Test 06/19/2014, 06/28/2015, 07/31/2016   Past Surgical History:  Procedure Laterality Date  . FINGER SURGERY    . ROTATOR CUFF REPAIR     FHx:    Reviewed / unchanged  SHx:    Reviewed / unchanged  Systems Review:  Constitutional: Denies fever, chills, wt changes, headaches, insomnia, fatigue, night sweats, change in appetite. Eyes: Denies redness, blurred vision, diplopia, discharge, itchy,  watery eyes.  ENT: Denies discharge, congestion, post nasal drip, epistaxis, sore throat, earache, hearing loss, dental pain, tinnitus, vertigo, sinus pain, snoring.  CV: Denies chest pain, palpitations, irregular heartbeat, syncope, dyspnea, diaphoresis, orthopnea, PND, claudication or edema. Respiratory: denies cough, dyspnea, DOE, pleurisy, hoarseness, laryngitis, wheezing.  Gastrointestinal: Denies dysphagia, odynophagia, heartburn, reflux, water brash, abdominal pain or cramps, nausea, vomiting, bloating, diarrhea, constipation, hematemesis, melena, hematochezia  or hemorrhoids. Genitourinary: Denies  dysuria, frequency, urgency, nocturia, hesitancy, discharge, hematuria or flank pain. Musculoskeletal: Denies arthralgias, myalgias, stiffness, jt. swelling, pain, limping or strain/sprain.  Skin: Denies pruritus, rash, hives, warts, acne, eczema or change in skin lesion(s). Neuro: No weakness, tremor, incoordination, spasms, paresthesia or pain. Psychiatric: Denies confusion, memory loss or sensory loss. Endo: Denies change in weight, skin or hair change.  Heme/Lymph: No excessive bleeding, bruising or enlarged lymph nodes.  Physical Exam  BP 122/82   Pulse 80   Temp 97.4 F (36.3 C)   Resp 16   Ht 5\' 7"  (1.702 m)   Wt 170 lb 12.8 oz (77.5 kg)   BMI 26.75 kg/m   Appears well nourished and in no distress.  Eyes: PERRLA, EOMs, conjunctiva no swelling or erythema. Sinuses: No frontal/maxillary tenderness ENT/Mouth: EAC's clear, TM's nl w/o erythema, bulging. Nares clear w/o erythema, swelling, exudates. Oropharynx clear without erythema or exudates. Oral hygiene is good. Tongue normal, non obstructing. Hearing intact.  Neck: Supple. Thyroid nl. Car 2+/2+ without bruits, nodes or JVD. Chest: Respirations nl with BS clear & equal w/o rales, rhonchi, wheezing or stridor.  Cor: Heart sounds normal w/ regular rate and rhythm without sig. murmurs, gallops, clicks, or rubs. Peripheral pulses normal and equal  without edema.  Abdomen: Soft & bowel sounds normal. Non-tender w/o guarding, rebound, hernias, masses, or organomegaly.  Lymphatics: Unremarkable.  Musculoskeletal: Full ROM all peripheral extremities, joint stability, 5/5 strength, and normal gait.  Skin: Warm, dry without exposed rashes, lesions or ecchymosis apparent.  Neuro: Cranial nerves intact, reflexes equal bilaterally. Sensory-motor testing grossly intact. Tendon reflexes grossly intact.  Pysch: Alert & oriented x 3.  Insight and judgement nl & appropriate. No ideations.  Assessment and Plan:  1. Essential  hypertension  - Continue medication, monitor blood pressure at home.  - Continue DASH diet. Reminder to go to the ER if any CP,  SOB, nausea, dizziness, severe HA, changes vision/speech,  left arm numbness and tingling and jaw pain. - CBC with Differential/Platelet - BASIC METABOLIC PANEL WITH GFR - Magnesium - TSH  2. Hyperlipidemia  - Continue diet/meds, exercise,& lifestyle modifications.  - Continue monitor periodic cholesterol/liver & renal functions  - Hepatic function panel - Lipid panel - TSH  3. Prediabetes  - Continue diet, exercise, lifestyle modifications.  - Monitor appropriate labs. - Hemoglobin A1c - Insulin, random  4. Vitamin D deficiency  - Continue supplementation. - VITAMIN D 25 Hydroxy  5. Medication management  - CBC with Differential/Platelet - BASIC METABOLIC PANEL WITH GFR - Hepatic function panel - Magnesium - Lipid panel - TSH - Hemoglobin A1c - Insulin, random - VITAMIN D 25 Hydroxy        Recommended regular exercise, BP monitoring, weight control, and discussed med and SE's. Recommended labs to assess and monitor clinical status. Further disposition pending results of labs. Over 30 minutes of exam, counseling, chart review was performed

## 2016-11-04 NOTE — Patient Instructions (Signed)

## 2016-11-05 ENCOUNTER — Other Ambulatory Visit: Payer: Self-pay | Admitting: *Deleted

## 2016-11-05 LAB — BASIC METABOLIC PANEL WITH GFR
BUN: 11 mg/dL (ref 7–25)
CHLORIDE: 106 mmol/L (ref 98–110)
CO2: 27 mmol/L (ref 20–31)
Calcium: 9.6 mg/dL (ref 8.6–10.3)
Creat: 1.04 mg/dL (ref 0.70–1.33)
GFR, Est African American: >89 mL/min (ref >=60)
GFR, Est Non African American: 83 mL/min (ref >=60)
Glucose, Bld: 84 mg/dL (ref 65–99)
POTASSIUM: 4.8 mmol/L (ref 3.5–5.3)
SODIUM: 140 mmol/L (ref 135–146)

## 2016-11-05 LAB — HEPATIC FUNCTION PANEL
ALT: 12 U/L (ref 9–46)
AST: 14 U/L (ref 10–35)
Albumin: 4 g/dL (ref 3.6–5.1)
Alkaline Phosphatase: 66 U/L (ref 40–115)
Bilirubin, Direct: 0.1 mg/dL (ref <=0.2)
Indirect Bilirubin: 0.4 mg/dL (ref 0.2–1.2)
Total Bilirubin: 0.5 mg/dL (ref 0.2–1.2)
Total Protein: 6.4 g/dL (ref 6.1–8.1)

## 2016-11-05 LAB — LIPID PANEL
CHOL/HDL RATIO: 7.9 ratio — AB (ref <5.0)
Cholesterol: 244 mg/dL — ABNORMAL HIGH (ref <200)
HDL: 31 mg/dL — ABNORMAL LOW (ref >40)
LDL CALC: 174 mg/dL — AB (ref <100)
Triglycerides: 197 mg/dL — ABNORMAL HIGH (ref <150)
VLDL: 39 mg/dL — AB (ref <30)

## 2016-11-05 LAB — HEMOGLOBIN A1C
Hgb A1c MFr Bld: 4.8 % (ref <5.7)
Mean Plasma Glucose: 91 mg/dL

## 2016-11-05 LAB — INSULIN, RANDOM: Insulin: 3.8 u[IU]/mL (ref 2.0–19.6)

## 2016-11-05 LAB — TSH: TSH: 0.82 mIU/L (ref 0.40–4.50)

## 2016-11-05 LAB — MAGNESIUM: Magnesium: 2 mg/dL (ref 1.5–2.5)

## 2016-11-05 LAB — VITAMIN D 25 HYDROXY (VIT D DEFICIENCY, FRACTURES): Vit D, 25-Hydroxy: 49 ng/mL (ref 30–100)

## 2016-11-05 MED ORDER — ALPRAZOLAM 0.5 MG PO TABS: ORAL_TABLET | ORAL | 0 refills | Status: DC

## 2016-11-05 MED ORDER — CITALOPRAM HYDROBROMIDE 10 MG PO TABS: 10.0000 mg | ORAL_TABLET | Freq: Every day | ORAL | 1 refills | Status: DC

## 2016-12-22 ENCOUNTER — Other Ambulatory Visit: Payer: Self-pay | Admitting: Internal Medicine

## 2016-12-22 NOTE — Telephone Encounter (Signed)
Please call Alpraz  

## 2016-12-31 ENCOUNTER — Telehealth: Payer: Self-pay | Admitting: *Deleted

## 2016-12-31 MED ORDER — LOSARTAN POTASSIUM 100 MG PO TABS: ORAL_TABLET | ORAL | 1 refills | Status: DC

## 2016-12-31 NOTE — Telephone Encounter (Signed)
Spouse called and reported patient is having a persistent, dry cough and is taking Lisinopril.  Per Dr Oneta Rack, stop the Lisinopril and a new RX for Losartan is being sent in to his pharmacy. The spouse was informed to start the Losartan at 1/2 tablet daily.

## 2017-02-09 ENCOUNTER — Other Ambulatory Visit: Payer: Self-pay | Admitting: Internal Medicine

## 2017-02-09 DIAGNOSIS — N528 Other male erectile dysfunction: Secondary | ICD-10-CM

## 2017-02-09 NOTE — Progress Notes (Signed)
This very nice 51 y.o. MWM presents for 3 month follow up with Hypertension, Hyperlipidemia, Pre-Diabetes and Vitamin D Deficiency. Patient also has chronic anxiety and is on  Alprazolam mostly at night, but stopped his low dose Citalopram 10 mg after 6 weeks for perceived dysphoria.      Patient is treated for HTN since 2010. Today's BP is elevated at 158/90 on 1/2 tab Losartan 100 mg = 50 mg dose.   Patient has had no complaints of any cardiac type chest pain, palpitations, dyspnea/orthopnea/PND, dizziness, claudication, or dependent edema.     Hyperlipidemia is not controlled with diet & he is reticent to taking meds for cholesterol stating "why would I take meds that could damage my liver". Last Lipids were not at goal:  Lab Results  Component Value Date   CHOL 244 (H) 11/04/2016   HDL 31 (L) 11/04/2016   LDLCALC 174 (H) 11/04/2016   TRIG 197 (H) 11/04/2016   CHOLHDL 7.9 (H) 11/04/2016      Also, the patient has history of PreDiabetes with an elevated A1c 5.7% in 2010 and then down to 5.1% in 2013. He has had no symptoms of reactive hypoglycemia, diabetic polys, paresthesias or visual blurring.  Last A1c was still at goal: Lab Results  Component Value Date   HGBA1C 4.8 11/04/2016      Further, the patient also has history of Vitamin D Deficiency with a level of "35" in 2010 and supplements vitamin D sporadically. Last vitamin D was  improved, but still low:  Lab Results  Component Value Date   VD25OH 25 11/04/2016   Current Outpatient Prescriptions on File Prior to Visit  Medication Sig  . ALPRAZolam (XANAX) 0.5 MG tablet TAKE 1/2 TO 1 TABLET UP TO 3 TIMES A DAY AS NEEDED FOR ANXIETY.  . Aspirin-Acetaminophen-Caffeine (GOODY HEADACHE PO) Take 1 Package by mouth 2 (two) times daily as needed (for pain).  Marland Kitchen atorvastatin (LIPITOR) 80 MG tablet Take 1 tablet (80 mg total) by mouth daily.  Marland Kitchen losartan (COZAAR) 100 MG tablet Takes 1/2 to 1 tablet daily for BP.  Marland Kitchen VIAGRA 100 MG tablet  TAKE 1/2 TO 1 TABLET AS NEEDED.  Marland Kitchen vitamin C (ASCORBIC ACID) 500 MG tablet Take 500 mg by mouth every morning.   Allergies  Allergen Reactions  . Lisinopril Cough  . Vicodin [Hydrocodone-Acetaminophen] Other (See Comments)    "makes my skin crawl"   PMHx:   Past Medical History:  Diagnosis Date  . Hyperlipidemia   . Hypertension   . Hypogonadism male   . Vitamin D deficiency    Immunization History  Administered Date(s) Administered  . DTaP 09/02/2007  . PPD Test 06/19/2014, 06/28/2015, 07/31/2016   Past Surgical History:  Procedure Laterality Date  . FINGER SURGERY    . ROTATOR CUFF REPAIR     FHx:    Reviewed / unchanged  SHx:    Reviewed / unchanged  Systems Review:  Constitutional: Denies fever, chills, wt changes, headaches, insomnia, fatigue, night sweats, change in appetite. Eyes: Denies redness, blurred vision, diplopia, discharge, itchy, watery eyes.  ENT: Denies discharge, congestion, post nasal drip, epistaxis, sore throat, earache, hearing loss, dental pain, tinnitus, vertigo, sinus pain, snoring.  CV: Denies chest pain, palpitations, irregular heartbeat, syncope, dyspnea, diaphoresis, orthopnea, PND, claudication or edema. Respiratory: denies cough, dyspnea, DOE, pleurisy, hoarseness, laryngitis, wheezing.  Gastrointestinal: Denies dysphagia, odynophagia, heartburn, reflux, water brash, abdominal pain or cramps, nausea, vomiting, bloating, diarrhea, constipation, hematemesis, melena, hematochezia  or hemorrhoids. Genitourinary: Denies dysuria, frequency, urgency, nocturia, hesitancy, discharge, hematuria or flank pain. Musculoskeletal: Denies arthralgias, myalgias, stiffness, jt. swelling, pain, limping or strain/sprain.  Skin: Denies pruritus, rash, hives, warts, acne, eczema or change in skin lesion(s). Neuro: No weakness, tremor, incoordination, spasms, paresthesia or pain. Psychiatric: Denies confusion, memory loss or sensory loss. Endo: Denies change in  weight, skin or hair change.  Heme/Lymph: No excessive bleeding, bruising or enlarged lymph nodes.  Physical Exam  BP (!) 158/90   Pulse 60   Temp 97.3 F (36.3 C)   Resp 16   Ht 5\' 7"  (1.702 m)   Wt 166 lb (75.3 kg)   BMI 26.00 kg/m   Appears well nourished, well groomed  and in no distress.  Eyes: PERRLA, EOMs, conjunctiva no swelling or erythema. Sinuses: No frontal/maxillary tenderness ENT/Mouth: EAC's clear, TM's nl w/o erythema, bulging. Nares clear w/o erythema, swelling, exudates. Oropharynx clear without erythema or exudates. Oral hygiene is good. Tongue normal, non obstructing. Hearing intact.  Neck: Supple. Thyroid nl. Car 2+/2+ without bruits, nodes or JVD. Chest: Respirations nl with BS clear & equal w/o rales, rhonchi, wheezing or stridor.  Cor: Heart sounds normal w/ regular rate and rhythm without sig. murmurs, gallops, clicks or rubs. Peripheral pulses normal and equal  without edema.  Abdomen: Soft & bowel sounds normal. Non-tender w/o guarding, rebound, hernias, masses or organomegaly.  Lymphatics: Unremarkable.  Musculoskeletal: Full ROM all peripheral extremities, joint stability, 5/5 strength and normal gait.  Skin: Warm, dry without exposed rashes, lesions or ecchymosis apparent.  Neuro: Cranial nerves intact, reflexes equal bilaterally. Sensory-motor testing grossly intact. Tendon reflexes grossly intact.  Pysch: Alert & oriented x 3.  Insight and judgement nl & appropriate. No ideations.  Assessment and Plan:   1. Essential hypertension  - Continue medication, monitor blood pressure at home.  - Continue DASH diet. Reminder to go to the ER if any CP,  SOB, nausea, dizziness, severe HA, changes vision/speech,  left arm numbness and tingling and jaw pain.  - CBC with Differential/Platelet - BASIC METABOLIC PANEL WITH GFR - Magnesium - TSH  2. Hyperlipidemia, mixed  - Continue diet/meds, exercise,& lifestyle modifications.  - Continue monitor  periodic cholesterol/liver & renal functions  - Hepatic function panel - Lipid panel - TSH  3. Prediabetes  - Continue diet, exercise, lifestyle modifications.  - Monitor appropriate labs.  - Hemoglobin A1c - Insulin, random  4. Vitamin D deficiency  - Continue supplementation.  - VITAMIN D 25 Hydroxy   5. Medication management  - CBC with Differential/Platelet - BASIC METABOLIC PANEL WITH GFR - Hepatic function panel - Magnesium - Lipid panel - TSH - Hemoglobin A1c - Insulin, random - VITAMIN D 25 Hydroxy       Strongly encouraged patient to retry the Citalopram 10 mg at a lower dose of 1/2 tab = 5 mg to help with his chronic anxiety.           Discussed  regular exercise, BP monitoring, weight control to achieve/maintain BMI less than 25 and discussed med and SE's. Recommended labs to assess and monitor clinical status with further disposition pending results of labs. Over 30 minutes of exam, counseling, chart review was performed.

## 2017-02-09 NOTE — Patient Instructions (Signed)

## 2017-02-10 ENCOUNTER — Encounter: Payer: Self-pay | Admitting: Internal Medicine

## 2017-02-10 ENCOUNTER — Ambulatory Visit (INDEPENDENT_AMBULATORY_CARE_PROVIDER_SITE_OTHER): Payer: BLUE CROSS/BLUE SHIELD | Admitting: Internal Medicine

## 2017-02-10 VITALS — BP 158/90 | HR 60 | Temp 97.3°F | Resp 16 | Ht 67.0 in | Wt 166.0 lb

## 2017-02-10 DIAGNOSIS — E559 Vitamin D deficiency, unspecified: Secondary | ICD-10-CM | POA: Diagnosis not present

## 2017-02-10 DIAGNOSIS — E782 Mixed hyperlipidemia: Secondary | ICD-10-CM

## 2017-02-10 DIAGNOSIS — F419 Anxiety disorder, unspecified: Secondary | ICD-10-CM | POA: Diagnosis not present

## 2017-02-10 DIAGNOSIS — I10 Essential (primary) hypertension: Secondary | ICD-10-CM | POA: Diagnosis not present

## 2017-02-10 DIAGNOSIS — Z79899 Other long term (current) drug therapy: Secondary | ICD-10-CM | POA: Diagnosis not present

## 2017-02-10 DIAGNOSIS — R7303 Prediabetes: Secondary | ICD-10-CM | POA: Diagnosis not present

## 2017-02-10 LAB — CBC WITH DIFFERENTIAL/PLATELET
BASOS ABS: 78 {cells}/uL (ref 0–200)
Basophils Relative: 1 %
EOS PCT: 7 %
Eosinophils Absolute: 546 cells/uL — ABNORMAL HIGH (ref 15–500)
HCT: 43.8 % (ref 38.5–50.0)
Hemoglobin: 14.7 g/dL (ref 13.2–17.1)
Lymphocytes Relative: 25 %
Lymphs Abs: 1950 cells/uL (ref 850–3900)
MCH: 28.3 pg (ref 27.0–33.0)
MCHC: 33.6 g/dL (ref 32.0–36.0)
MCV: 84.2 fL (ref 80.0–100.0)
MPV: 10.7 fL (ref 7.5–12.5)
Monocytes Absolute: 312 cells/uL (ref 200–950)
Monocytes Relative: 4 %
NEUTROS ABS: 4914 {cells}/uL (ref 1500–7800)
NEUTROS PCT: 63 %
PLATELETS: 164 10*3/uL (ref 140–400)
RBC: 5.2 MIL/uL (ref 4.20–5.80)
RDW: 13.7 % (ref 11.0–15.0)
WBC: 7.8 10*3/uL (ref 3.8–10.8)

## 2017-02-11 LAB — LIPID PANEL
CHOL/HDL RATIO: 4.9 ratio (ref <5.0)
CHOLESTEROL: 195 mg/dL (ref <200)
HDL: 40 mg/dL — AB (ref >40)
LDL Cholesterol: 132 mg/dL — ABNORMAL HIGH (ref <100)
TRIGLYCERIDES: 113 mg/dL (ref <150)
VLDL: 23 mg/dL (ref <30)

## 2017-02-11 LAB — HEMOGLOBIN A1C
HEMOGLOBIN A1C: 5.2 % (ref <5.7)
Mean Plasma Glucose: 103 mg/dL

## 2017-02-11 LAB — TSH: TSH: 0.65 mIU/L (ref 0.40–4.50)

## 2017-02-11 LAB — BASIC METABOLIC PANEL WITH GFR
BUN: 11 mg/dL (ref 7–25)
CALCIUM: 9.3 mg/dL (ref 8.6–10.3)
CO2: 24 mmol/L (ref 20–31)
CREATININE: 1.09 mg/dL (ref 0.70–1.33)
Chloride: 103 mmol/L (ref 98–110)
GFR, EST NON AFRICAN AMERICAN: 78 mL/min (ref >=60)
Glucose, Bld: 94 mg/dL (ref 65–99)
Potassium: 4.2 mmol/L (ref 3.5–5.3)
SODIUM: 139 mmol/L (ref 135–146)

## 2017-02-11 LAB — HEPATIC FUNCTION PANEL
ALT: 16 U/L (ref 9–46)
AST: 16 U/L (ref 10–35)
Albumin: 4 g/dL (ref 3.6–5.1)
Alkaline Phosphatase: 81 U/L (ref 40–115)
BILIRUBIN DIRECT: 0.1 mg/dL (ref <=0.2)
Indirect Bilirubin: 0.3 mg/dL (ref 0.2–1.2)
Total Bilirubin: 0.4 mg/dL (ref 0.2–1.2)
Total Protein: 6.6 g/dL (ref 6.1–8.1)

## 2017-02-11 LAB — VITAMIN D 25 HYDROXY (VIT D DEFICIENCY, FRACTURES): VIT D 25 HYDROXY: 40 ng/mL (ref 30–100)

## 2017-02-11 LAB — INSULIN, RANDOM: Insulin: 10 u[IU]/mL (ref 2.0–19.6)

## 2017-02-11 LAB — MAGNESIUM: MAGNESIUM: 1.9 mg/dL (ref 1.5–2.5)

## 2017-05-13 ENCOUNTER — Ambulatory Visit: Payer: Self-pay | Admitting: Internal Medicine

## 2017-07-13 ENCOUNTER — Other Ambulatory Visit: Payer: Self-pay | Admitting: Internal Medicine

## 2017-08-11 ENCOUNTER — Other Ambulatory Visit: Payer: Self-pay | Admitting: Internal Medicine

## 2017-09-07 NOTE — Progress Notes (Signed)
[facility]   Grant Campbell, M.D.     Dyanne Carrel. Steffanie Dunn, P.A.-C Judd Gaudier, DNP [facility]                [address]                [place], [place]. [address] Telephone [phone] Telefax [phone] Annual  Screening/Preventative Visit  & Comprehensive Evaluation & Examination     This very nice 52 y.o. MWM presents for a Screening/Preventative Visit & comprehensive evaluation and management of multiple medical co-morbidities.  Patient has been followed for HTN, Prediabetes, Hyperlipidemia and Vitamin D Deficiency.     HTN predates since (2010). Patient's BP has been controlled at home.  Today's BP is elevated at 148/94. Patient denies any cardiac symptoms as chest pain, palpitations, shortness of breath, dizziness or ankle swelling.     Patient's hyperlipidemia is controlled with diet and medications. Patient denies myalgias or other medication SE's. Last lipids were not at goal: Lab Results  Component Value Date   CHOL 195 02/10/2017   HDL 40 (L) 02/10/2017   LDLCALC 132 (H) 02/10/2017   TRIG 113 02/10/2017   CHOLHDL 4.9 02/10/2017      Patient has prediabetes since    and patient denies reactive hypoglycemic symptoms, visual blurring, diabetic polys or paresthesias. Last A1c was Normal and at goal: Lab Results  Component Value Date   HGBA1C 5.2 02/10/2017       Finally, patient has history of Vitamin D Deficiency of    and last vitamin D was still low (goal 70-100): Lab Results  Component Value Date   VD25OH 40 02/10/2017   Current Outpatient Medications on File Prior to Visit  Medication Sig  . ALPRAZolam (XANAX) 1 MG tablet Take 1/2 to 1 tablet 2 to 3 x / day  Only if needed for anxiety attack and please try to limit to 5 days /week to avoid addiction  . Aspirin-Acetaminophen-Caffeine (GOODY HEADACHE PO) Take 1 Package by mouth 2 (two) times daily as needed (for pain).  Marland Kitchen atorvastatin  (LIPITOR) 80 MG tablet TAKE 1 TABLET ONCE DAILY.  Marland Kitchen losartan (COZAAR) 100 MG tablet TAKE 1/2 TO 1 TABLET ONCE DAILY FOR HIGH BLOOD PRESSURE.  Marland Kitchen VIAGRA 100 MG tablet TAKE 1/2 TO 1 TABLET AS NEEDED.  Marland Kitchen vitamin C (ASCORBIC ACID) 500 MG tablet Take 500 mg by mouth every morning.   No current facility-administered medications on file prior to visit.    Allergies  Allergen Reactions  . Lisinopril Cough  . Vicodin [Hydrocodone-Acetaminophen] Other (See Comments)    "makes my skin crawl"   Past Medical History:  Diagnosis Date  . Hyperlipidemia   . Hypertension   . Hypogonadism male   . Vitamin D deficiency    Health Maintenance  Topic Date Due  . HIV Screening  11/15/1980  . COLONOSCOPY  11/16/2015  . INFLUENZA VACCINE  04/01/2017  . TETANUS/TDAP  07/31/2026   Immunization History  Administered Date(s) Administered  . DTaP 09/02/2007  . PPD Test 06/19/2014, 06/28/2015, 07/31/2016   Last Colon -  Past Surgical History:  Procedure Laterality Date  . FINGER SURGERY    . ROTATOR CUFF REPAIR     Family History  Problem Relation Age of Onset  . Heart attack Mother   . Kidney failure Father   . Hypertension Brother   . Hypothyroidism Brother   . Hyperlipidemia Brother    Social History  Socioeconomic History  . Marital status: Married    Spouse name: Sherrie  Occupational History  . Games developerConstruction supervisor  Tobacco Use  . Smoking status: Current Every Day Smoker    Packs/day: 1.00    Types: Cigarettes  . Smokeless tobacco: Never Used  Substance and Sexual Activity  . Alcohol use: Alleges alcohol abstinance     Alcohol/week:     Types:     Comment: occasional  . Drug use: No  . Sexual activity: Active  Other Topics Concern  . Not on file  Social History Narrative  . Not on file    ROS Constitutional: Denies fever, chills, weight loss/gain, headaches, insomnia,  night sweats or change in appetite. Does c/o fatigue. Eyes: Denies redness, blurred vision,  diplopia, discharge, itchy or watery eyes.  ENT: Denies discharge, congestion, post nasal drip, epistaxis, sore throat, earache, hearing loss, dental pain, Tinnitus, Vertigo, Sinus pain or snoring.  Cardio: Denies chest pain, palpitations, irregular heartbeat, syncope, dyspnea, diaphoresis, orthopnea, PND, claudication or edema Respiratory: denies cough, dyspnea, DOE, pleurisy, hoarseness, laryngitis or wheezing.  Gastrointestinal: Denies dysphagia, heartburn, reflux, water brash, pain, cramps, nausea, vomiting, bloating, diarrhea, constipation, hematemesis, melena, hematochezia, jaundice or hemorrhoids Genitourinary: Denies dysuria, frequency, urgency, nocturia, hesitancy, discharge, hematuria or flank pain Musculoskeletal: Denies arthralgia, myalgia, stiffness, Jt. Swelling, pain, limp or strain/sprain. Denies Falls. Skin: Denies puritis, rash, hives, warts, acne, eczema or change in skin lesion Neuro: No weakness, tremor, incoordination, spasms, paresthesia or pain Psychiatric: Denies confusion, memory loss or sensory loss. Denies Depression. Endocrine: Denies change in weight, skin, hair change, nocturia, and paresthesia, diabetic polys, visual blurring or hyper / hypo glycemic episodes.  Heme/Lymph: No excessive bleeding, bruising or enlarged lymph nodes.  Physical Exam  BP (!) 148/94   Pulse 73   Temp (!) 97.3 F (36.3 C)   Ht 5\' 7"  (1.702 m)   Wt 166 lb 6.4 oz (75.5 kg)   SpO2 97%   BMI 26.06 kg/m   General Appearance: Well nourished and well groomed and in no apparent distress.  Eyes: PERRLA, EOMs, conjunctiva no swelling or erythema, normal fundi and vessels. Sinuses: No frontal/maxillary tenderness ENT/Mouth: EACs patent / TMs  nl. Nares clear without erythema, swelling, mucoid exudates. Oral hygiene is good. No erythema, swelling, or exudate. Tongue normal, non-obstructing. Tonsils not swollen or erythematous. Hearing normal.  Neck: Supple, thyroid normal. No bruits, nodes or  JVD. Respiratory: Respiratory effort normal.  BS equal and clear bilateral without rales, rhonci, wheezing or stridor. Cardio: Heart sounds are normal with regular rate and rhythm and no murmurs, rubs or gallops. Peripheral pulses are normal and equal bilaterally without edema. No aortic or femoral bruits. Chest: symmetric with normal excursions and percussion.  Abdomen: Soft, with Nl bowel sounds. Nontender, no guarding, rebound, hernias, masses, or organomegaly.  Lymphatics: Non tender without lymphadenopathy.  Genitourinary: No hernias.Testes nl. DRE - prostate nl for age - smooth & firm w/o nodules. Musculoskeletal: Full ROM all peripheral extremities, joint stability, 5/5 strength, and normal gait. Skin: Warm and dry without rashes, lesions, cyanosis, clubbing or  ecchymosis.  Neuro: Cranial nerves intact, reflexes equal bilaterally. Normal muscle tone, no cerebellar symptoms. Sensation intact.  Pysch: Alert and oriented X 3 with normal affect, insight and judgment appropriate.   Assessment and Plan  1. Annual Preventative/Screening Exam   2. Essential hypertension  - DG Chest 2 View; Future - EKG 12-Lead - US, RETROPERITNL ABD,  LTD - Urinalysis, Routine w reflex microscopic - Microalbumin / creatinine  urine ratio - CBC with Differential/Platelet - BASIC METABOLIC PANEL WITH GFR - Magnesium - TSH  - D/c Losartan and replace with  - olmesartan (BENICAR) 40 MG tablet; Take 1 tablet daily for BP  Dispense: 90 tablet; Refill: 1 - monitor BP's and call in 2 -3 weeks if remains elevated over 140/90. 3. Hyperlipidemia, mixed  - DG Chest 2 View; Future - EKG 12-Lead - [place], RETROPERITNL ABD,  LTD - Hepatic function panel - Lipid panel - TSH  4. Prediabetes  - EKG 12-Lead - [place], RETROPERITNL ABD,  LTD - Hemoglobin A1c - Insulin, random  5. Vitamin D deficiency  - VITAMIN D 25 Hydroxy  6. Gastroesophageal reflux disease   7. Screening for colorectal cancer  - POC  Hemoccult Bld/Stl  - CBC with Differential/Platelet  8. Prostate cancer screening  - PSA  9. Screening for ischemic heart disease  - EKG 12-Lead  10. Screening for AAA (aortic abdominal aneurysm)  - [place], RETROPERITNL ABD,  LTD  11. Smoker  - DG Chest 2 View; Future - EKG 12-Lead - [place], RETROPERITNL ABD,  LTD  12. Screening examination for pulmonary tuberculosis   13. Fatigue  - Iron,Total/Total Iron Binding Cap - Vitamin B12 - Testosterone - TSH  14. Medication management  - Urinalysis, Routine w reflex microscopic - Microalbumin / creatinine urine ratio - CBC with Differential/Platelet - BASIC METABOLIC PANEL WITH GFR - Hepatic function panel - Magnesium - Lipid panel - TSH - Hemoglobin A1c - Insulin, random - VITAMIN D 25 Hydroxy         Patient was counseled in prudent diet, weight control to achieve/maintain BMI less than 25, BP monitoring, regular exercise and medications as discussed.  Discussed med effects and SE's. Routine screening labs and tests as requested with regular follow-up as recommended. Over 40 minutes of exam, counseling, chart review and high complex critical decision making was performed

## 2017-09-07 NOTE — Patient Instructions (Signed)

## 2017-09-08 ENCOUNTER — Ambulatory Visit: Payer: BLUE CROSS/BLUE SHIELD | Admitting: Internal Medicine

## 2017-09-08 ENCOUNTER — Encounter: Payer: Self-pay | Admitting: Internal Medicine

## 2017-09-08 VITALS — BP 148/94 | HR 73 | Temp 97.3°F | Ht 67.0 in | Wt 166.4 lb

## 2017-09-08 DIAGNOSIS — Z1211 Encounter for screening for malignant neoplasm of colon: Secondary | ICD-10-CM

## 2017-09-08 DIAGNOSIS — E291 Testicular hypofunction: Secondary | ICD-10-CM

## 2017-09-08 DIAGNOSIS — Z125 Encounter for screening for malignant neoplasm of prostate: Secondary | ICD-10-CM | POA: Diagnosis not present

## 2017-09-08 DIAGNOSIS — E559 Vitamin D deficiency, unspecified: Secondary | ICD-10-CM

## 2017-09-08 DIAGNOSIS — Z1212 Encounter for screening for malignant neoplasm of rectum: Secondary | ICD-10-CM

## 2017-09-08 DIAGNOSIS — E782 Mixed hyperlipidemia: Secondary | ICD-10-CM

## 2017-09-08 DIAGNOSIS — Z0000 Encounter for general adult medical examination without abnormal findings: Secondary | ICD-10-CM | POA: Diagnosis not present

## 2017-09-08 DIAGNOSIS — Z0001 Encounter for general adult medical examination with abnormal findings: Secondary | ICD-10-CM

## 2017-09-08 DIAGNOSIS — Z136 Encounter for screening for cardiovascular disorders: Secondary | ICD-10-CM

## 2017-09-08 DIAGNOSIS — I10 Essential (primary) hypertension: Secondary | ICD-10-CM | POA: Diagnosis not present

## 2017-09-08 DIAGNOSIS — Z79899 Other long term (current) drug therapy: Secondary | ICD-10-CM

## 2017-09-08 DIAGNOSIS — Z111 Encounter for screening for respiratory tuberculosis: Secondary | ICD-10-CM

## 2017-09-08 DIAGNOSIS — F17200 Nicotine dependence, unspecified, uncomplicated: Secondary | ICD-10-CM

## 2017-09-08 DIAGNOSIS — K219 Gastro-esophageal reflux disease without esophagitis: Secondary | ICD-10-CM

## 2017-09-08 DIAGNOSIS — R5383 Other fatigue: Secondary | ICD-10-CM

## 2017-09-08 DIAGNOSIS — R7303 Prediabetes: Secondary | ICD-10-CM

## 2017-09-08 MED ORDER — OLMESARTAN MEDOXOMIL 40 MG PO TABS: ORAL_TABLET | ORAL | 1 refills | Status: DC

## 2017-09-09 LAB — URINALYSIS, ROUTINE W REFLEX MICROSCOPIC
BILIRUBIN URINE: NEGATIVE
GLUCOSE, UA: NEGATIVE
HGB URINE DIPSTICK: NEGATIVE
Ketones, ur: NEGATIVE
Leukocytes, UA: NEGATIVE
Nitrite: NEGATIVE
PROTEIN: NEGATIVE
Specific Gravity, Urine: 1.015 (ref 1.001–1.03)
pH: 5.5 (ref 5.0–8.0)

## 2017-09-09 LAB — MAGNESIUM: MAGNESIUM: 2.1 mg/dL (ref 1.5–2.5)

## 2017-09-09 LAB — CBC WITH DIFFERENTIAL/PLATELET
BASOS ABS: 72 {cells}/uL (ref 0–200)
Basophils Relative: 0.9 %
EOS ABS: 624 {cells}/uL — AB (ref 15–500)
Eosinophils Relative: 7.8 %
HEMATOCRIT: 45.4 % (ref 38.5–50.0)
HEMOGLOBIN: 15.6 g/dL (ref 13.2–17.1)
Lymphs Abs: 2320 cells/uL (ref 850–3900)
MCH: 28.4 pg (ref 27.0–33.0)
MCHC: 34.4 g/dL (ref 32.0–36.0)
MCV: 82.7 fL (ref 80.0–100.0)
MPV: 10.6 fL (ref 7.5–12.5)
Monocytes Relative: 5.6 %
NEUTROS ABS: 4536 {cells}/uL (ref 1500–7800)
NEUTROS PCT: 56.7 %
Platelets: 211 10*3/uL (ref 140–400)
RBC: 5.49 10*6/uL (ref 4.20–5.80)
RDW: 13.3 % (ref 11.0–15.0)
Total Lymphocyte: 29 %
WBC mixed population: 448 cells/uL (ref 200–950)
WBC: 8 10*3/uL (ref 3.8–10.8)

## 2017-09-09 LAB — HEMOGLOBIN A1C
EAG (MMOL/L): 5.5 (calc)
Hgb A1c MFr Bld: 5.1 % of total Hgb (ref <5.7)
Mean Plasma Glucose: 100 (calc)

## 2017-09-09 LAB — HEPATIC FUNCTION PANEL
AG Ratio: 1.7 (calc) (ref 1.0–2.5)
ALBUMIN MSPROF: 4.3 g/dL (ref 3.6–5.1)
ALT: 14 U/L (ref 9–46)
AST: 16 U/L (ref 10–35)
Alkaline phosphatase (APISO): 72 U/L (ref 40–115)
Bilirubin, Direct: 0.1 mg/dL (ref 0.0–0.2)
GLOBULIN: 2.6 g/dL (ref 1.9–3.7)
Indirect Bilirubin: 0.4 mg/dL (calc) (ref 0.2–1.2)
TOTAL PROTEIN: 6.9 g/dL (ref 6.1–8.1)
Total Bilirubin: 0.5 mg/dL (ref 0.2–1.2)

## 2017-09-09 LAB — LIPID PANEL
CHOLESTEROL: 234 mg/dL — AB (ref <200)
HDL: 39 mg/dL — ABNORMAL LOW (ref >40)
LDL CHOLESTEROL (CALC): 164 mg/dL — AB
Non-HDL Cholesterol (Calc): 195 mg/dL (calc) — ABNORMAL HIGH (ref <130)
TRIGLYCERIDES: 162 mg/dL — AB (ref <150)
Total CHOL/HDL Ratio: 6 (calc) — ABNORMAL HIGH (ref <5.0)

## 2017-09-09 LAB — BASIC METABOLIC PANEL WITH GFR
BUN: 14 mg/dL (ref 7–25)
CALCIUM: 9.2 mg/dL (ref 8.6–10.3)
CHLORIDE: 102 mmol/L (ref 98–110)
CO2: 30 mmol/L (ref 20–32)
Creat: 1.05 mg/dL (ref 0.70–1.33)
GFR, Est African American: 95 mL/min/{1.73_m2} (ref >=60)
GFR, Est Non African American: 82 mL/min/{1.73_m2} (ref >=60)
GLUCOSE: 85 mg/dL (ref 65–99)
Potassium: 4 mmol/L (ref 3.5–5.3)
Sodium: 139 mmol/L (ref 135–146)

## 2017-09-09 LAB — MICROALBUMIN / CREATININE URINE RATIO
Creatinine, Urine: 106 mg/dL (ref 20–320)
Microalb Creat Ratio: 8 mcg/mg creat (ref <30)
Microalb, Ur: 0.8 mg/dL

## 2017-09-09 LAB — TESTOSTERONE: TESTOSTERONE: 557 ng/dL (ref 250–827)

## 2017-09-09 LAB — VITAMIN B12: VITAMIN B 12: 457 pg/mL (ref 200–1100)

## 2017-09-09 LAB — IRON, TOTAL/TOTAL IRON BINDING CAP
%SAT: 22 % (calc) (ref 15–60)
IRON: 75 ug/dL (ref 50–180)
TIBC: 342 mcg/dL (calc) (ref 250–425)

## 2017-09-09 LAB — TSH: TSH: 0.73 mIU/L (ref 0.40–4.50)

## 2017-09-09 LAB — INSULIN, RANDOM: INSULIN: 4.4 u[IU]/mL (ref 2.0–19.6)

## 2017-09-09 LAB — PSA: PSA: 0.5 ng/mL (ref <=4.0)

## 2017-09-09 LAB — VITAMIN D 25 HYDROXY (VIT D DEFICIENCY, FRACTURES): VIT D 25 HYDROXY: 29 ng/mL — AB (ref 30–100)

## 2017-09-10 ENCOUNTER — Other Ambulatory Visit: Payer: Self-pay | Admitting: Internal Medicine

## 2017-09-10 MED ORDER — ALPRAZOLAM 1 MG PO TABS: ORAL_TABLET | ORAL | 0 refills | Status: DC

## 2017-12-01 ENCOUNTER — Encounter: Payer: Self-pay | Admitting: Internal Medicine

## 2017-12-01 ENCOUNTER — Ambulatory Visit: Payer: BLUE CROSS/BLUE SHIELD | Admitting: Internal Medicine

## 2017-12-01 VITALS — BP 110/78 | HR 76 | Temp 97.3°F | Resp 16 | Ht 67.0 in | Wt 175.4 lb

## 2017-12-01 DIAGNOSIS — R42 Dizziness and giddiness: Secondary | ICD-10-CM | POA: Diagnosis not present

## 2017-12-01 DIAGNOSIS — R5383 Other fatigue: Secondary | ICD-10-CM

## 2017-12-01 DIAGNOSIS — M791 Myalgia, unspecified site: Secondary | ICD-10-CM | POA: Diagnosis not present

## 2017-12-01 DIAGNOSIS — Z79899 Other long term (current) drug therapy: Secondary | ICD-10-CM | POA: Diagnosis not present

## 2017-12-01 DIAGNOSIS — M2550 Pain in unspecified joint: Secondary | ICD-10-CM

## 2017-12-01 DIAGNOSIS — I10 Essential (primary) hypertension: Secondary | ICD-10-CM

## 2017-12-01 MED ORDER — PREDNISONE 20 MG PO TABS: ORAL_TABLET | ORAL | 0 refills | Status: DC

## 2017-12-01 NOTE — Progress Notes (Signed)
  Subjective:    Patient ID: Grant Campbell, male    DOB: [birth year], 52 y.o.   MRN: [id number]  HPI  This nice 52 yo MWM recently started on Losartan was switched to Benicar (due to Losartan recall) about 30 days ago. He's had ongoing LBP about 2 month and therefore self-discontinued his Atorvastatin about 6 weeks ago. He's also had c/o bilat hip pains exacerbated with walking on the job. And has had some intermittent Rt elbow pains. He's also had some vague complaints of dizziness, but he cannot distinguish if postural, or positional.   Medication Sig  . ALPRAZolam  1 MG tablet TAKE 1/2-1 TAB 2-3 TIMES A DAY ONLY IF NEEDED  . GOODY HEADACHE  Take 1 Package  2  times daily as needed  . atorvastatin  80 MG tablet TAKE 1 TABLET ONCE DAILY. - OFF x 6 weeks  . olmesartan 40 MG tablet Take 1 tablet daily for BP  . VIAGRA 100 MG tablet TAKE 1/2 TO 1 TABLET AS NEEDED.  Marland Kitchen. vitamin C  500 MG tablet Take 500 mg by mouth every morning.   Allergies  Allergen Reactions  . Lisinopril Cough  . Vicodin [Hydrocodone-Acetaminophen] Other (See Comments)    "makes my skin crawl"    Past Medical History:  Diagnosis Date  . Hyperlipidemia   . Hypertension   . Hypogonadism male   . Vitamin D deficiency    Past Surgical History:  Procedure Laterality Date  . FINGER SURGERY    . ROTATOR CUFF REPAIR     Review of Systems  10 point systems review negative except as above.    Objective:   Physical Exam  BP 110/78   Pulse 76   Temp (!) 97.3 F (36.3 C)   Resp 16   Ht 5\' 7"  (1.702 m)   Wt 175 lb 6.4 oz (79.6 kg)   BMI 27.47 kg/m   Postural sitting BP 135/94  P 81    &  Standing BP 127/79   P87  (No Benicar since yesterday morning)   In No Distress  HEENT - EAC's patent. TM's Nl. EOM's full. PERRLA. NasoOroPharynx clear. Neck - supple. Nl Thyroid. Carotids 2+ & No bruits, nodes, JVD Chest - Clear equal BS w/o rales, rhonchi, wheezes. Cor - Nl HS. RRR w/o sig MGR. PP 1(+). No edema. Abd - No  palpable organomegaly, masses or tenderness. BS nl. MS- FROM w/o deformities. Muscle power, tone and bulk Nl. Gait Nl. No point tenderness around the Rt elbow.  Neuro - No obvious Cr N abnormalities. Sensory, motor and Cerebellar functions appear Nl w/o focal abnormalities. Psyche - Mental status normal & appropriate.  No delusions, ideations or obvious mood abnormalities. Skin - exposed clear w/o rash cyanosis or icterus.    Assessment & Plan:   1. Dizziness  - CBC with Differential/Platelet - BASIC METABOLIC PANEL WITH GFR  2. Fatigue, unspecified type  - CBC with Differential/Platelet - BASIC METABOLIC PANEL WITH GFR - Hepatic function panel - TSH  3. Essential hypertension  - CBC with Differential/Platelet - BASIC METABOLIC PANEL WITH GFR - Magnesium - TSH  4. Generalized muscle ache  - TSH - Sedimentation rate - CK  5. Arthralgia, unspecified joint  - Sedimentation rate  6. Medication management

## 2017-12-01 NOTE — Patient Instructions (Signed)
Monitor standing BP daily  If BP over 150 - restart Benicar (Olmesartan)  at 1/2 tablet daily ++++++++++++++++++++++++++++ Dizziness  Dizziness is a common problem. It is a feeling of unsteadiness or light-headedness. You may feel like you are about to faint. Dizziness can lead to injury if you stumble or fall. Anyone can become dizzy, but dizziness is more common in older adults. This condition can be caused by a number of things, including medicines, dehydration, or illness. Follow these instructions at home: Eating and drinking  Drink enough fluid to keep your urine clear or pale yellow. This helps to keep you from becoming dehydrated. Try to drink more clear fluids, such as water.  Do not drink alcohol.  Limit your caffeine intake if told to do so by your health care provider. Check ingredients and nutrition facts to see if a food or beverage contains caffeine.  Limit your salt (sodium) intake if told to do so by your health care provider. Check ingredients and nutrition facts to see if a food or beverage contains sodium. Activity  Avoid making quick movements. ? Rise slowly from chairs and steady yourself until you feel okay. ? In the morning, first sit up on the side of the bed. When you feel okay, stand slowly while you hold onto something until you know that your balance is fine.  If you need to stand in one place for a long time, move your legs often. Tighten and relax the muscles in your legs while you are standing.  Do not drive or use heavy machinery if you feel dizzy.  Avoid bending down if you feel dizzy. Place items in your home so that they are easy for you to reach without leaning over. Lifestyle  Do not use any products that contain nicotine or tobacco, such as cigarettes and e-cigarettes. If you need help quitting, ask your health care provider.  Try to reduce your stress level by using methods such as yoga or meditation. Talk with your health care provider if you  need help to manage your stress. General instructions  Watch your dizziness for any changes.  Take over-the-counter and prescription medicines only as told by your health care provider. Talk with your health care provider if you think that your dizziness is caused by a medicine that you are taking.  Tell a friend or a family member that you are feeling dizzy. If he or she notices any changes in your behavior, have this person call your health care provider.  Keep all follow-up visits as told by your health care provider. This is important. Contact a health care provider if:  Your dizziness does not go away.  Your dizziness or light-headedness gets worse.  You feel nauseous.  You have reduced hearing.  You have new symptoms.  You are unsteady on your feet or you feel like the room is spinning. Get help right away if:  You vomit or have diarrhea and are unable to eat or drink anything.  You have problems talking, walking, swallowing, or using your arms, hands, or legs.  You feel generally weak.  You are not thinking clearly or you have trouble forming sentences. It may take a friend or family member to notice this.  You have chest pain, abdominal pain, shortness of breath, or sweating.  Your vision changes.  You have any bleeding.  You have a severe headache.  You have neck pain or a stiff neck.  You have a fever. These symptoms may represent a  serious problem that is an emergency. Do not wait to see if the symptoms will go away. Get medical help right away. Call your local emergency services (911 in the [place] Do not drive yourself to the hospital. Summary  Dizziness is a feeling of unsteadiness or light-headedness. This condition can be caused by a number of things, including medicines, dehydration, or illness.  Anyone can become dizzy, but dizziness is more common in older adults.  Drink enough fluid to keep your urine clear or pale yellow. Do not drink  alcohol.  Avoid making quick movements if you feel dizzy. Monitor your dizziness for any changes. This information is not intended to replace advice given to you by your health care provider. Make sure you discuss any questions you have with your health care provider. Document Released: 02/11/2001 Document Revised: 09/20/2016 Document Reviewed: 09/20/2016 Elsevier Interactive Patient Education  [employer].

## 2017-12-02 LAB — CBC WITH DIFFERENTIAL/PLATELET
BASOS PCT: 0.8 %
Basophils Absolute: 72 cells/uL (ref 0–200)
EOS ABS: 603 {cells}/uL — AB (ref 15–500)
Eosinophils Relative: 6.7 %
HCT: 45.6 % (ref 38.5–50.0)
HEMOGLOBIN: 15.7 g/dL (ref 13.2–17.1)
Lymphs Abs: 2673 cells/uL (ref 850–3900)
MCH: 28.2 pg (ref 27.0–33.0)
MCHC: 34.4 g/dL (ref 32.0–36.0)
MCV: 82 fL (ref 80.0–100.0)
MPV: 10.8 fL (ref 7.5–12.5)
Monocytes Relative: 5.1 %
Neutro Abs: 5193 cells/uL (ref 1500–7800)
Neutrophils Relative %: 57.7 %
PLATELETS: 242 10*3/uL (ref 140–400)
RBC: 5.56 10*6/uL (ref 4.20–5.80)
RDW: 13.3 % (ref 11.0–15.0)
TOTAL LYMPHOCYTE: 29.7 %
WBC: 9 10*3/uL (ref 3.8–10.8)
WBCMIX: 459 {cells}/uL (ref 200–950)

## 2017-12-02 LAB — MAGNESIUM: Magnesium: 2.4 mg/dL (ref 1.5–2.5)

## 2017-12-02 LAB — HEPATIC FUNCTION PANEL
AG RATIO: 1.6 (calc) (ref 1.0–2.5)
ALT: 20 U/L (ref 9–46)
AST: 20 U/L (ref 10–35)
Albumin: 4.6 g/dL (ref 3.6–5.1)
Alkaline phosphatase (APISO): 69 U/L (ref 40–115)
BILIRUBIN TOTAL: 0.5 mg/dL (ref 0.2–1.2)
Bilirubin, Direct: 0.1 mg/dL (ref 0.0–0.2)
Globulin: 2.8 g/dL (calc) (ref 1.9–3.7)
Indirect Bilirubin: 0.4 mg/dL (calc) (ref 0.2–1.2)
TOTAL PROTEIN: 7.4 g/dL (ref 6.1–8.1)

## 2017-12-02 LAB — SEDIMENTATION RATE: Sed Rate: 2 mm/h (ref 0–20)

## 2017-12-02 LAB — TSH: TSH: 1.07 mIU/L (ref 0.40–4.50)

## 2017-12-02 LAB — BASIC METABOLIC PANEL WITH GFR
BUN: 23 mg/dL (ref 7–25)
CALCIUM: 9.5 mg/dL (ref 8.6–10.3)
CO2: 25 mmol/L (ref 20–32)
Chloride: 105 mmol/L (ref 98–110)
Creat: 1.23 mg/dL (ref 0.70–1.33)
GFR, EST NON AFRICAN AMERICAN: 67 mL/min/{1.73_m2} (ref >=60)
GFR, Est African American: 78 mL/min/{1.73_m2} (ref >=60)
Glucose, Bld: 70 mg/dL (ref 65–99)
POTASSIUM: 4.8 mmol/L (ref 3.5–5.3)
Sodium: 139 mmol/L (ref 135–146)

## 2017-12-02 LAB — CK: CK TOTAL: 168 U/L (ref 44–196)

## 2017-12-09 NOTE — Progress Notes (Signed)
FOLLOW UP  Assessment and Plan:   Hypertension Stopped taking olmesartan due to intolerance; agreeable to restarting losartan which he tolerated well previously - discussed risks of untreated BP at length and emphasized need for consistent adherence to therapy Monitor blood pressure at home; patient to call if consistently greater than 130/80 Continue DASH diet.   Reminder to go to the ER if any CP, SOB, nausea, dizziness, severe HA, changes vision/speech, left arm numbness and tingling and jaw pain.  Cholesterol Currently poorly controlled; admittedly does not take atorvastatin, states he is unwilling to get on medication for cholesterol. He is open to possibly supplements - have suggested omega 3, red yeast rice, and daily soluble fiber supplement.  Continue to recommend low cholesterol diet and exercise.  Check lipid panel.   Other abnormal gluocse Recent A1Cs at goal Discussed diet/exercise, weight management  Defer A1C; check BMP  Obesity with co morbidities Long discussion about weight loss, diet, and exercise Recommended diet heavy in fruits and veggies and low in animal meats, cheeses, and dairy products, appropriate calorie intake Discussed ideal weight for height  Will follow up in 3 months  Vitamin D Def Not currently on supplement due to not tolerating previously recommended 5000 IU - agreeable to restarting at low dose - suggested 1000 IU and titrate up as tolerated Defer Vit D level  Anxiety  Well managed by current regimen; currently uses xanax <5 days weekly Stress management techniques discussed, increase water, good sleep hygiene discussed, increase exercise, and increase veggies.    Continue diet and meds as discussed. Further disposition pending results of labs. Discussed med's effects and SE's.   Over 30 minutes of exam, counseling, chart review, and critical decision making was performed.   Future Appointments  Date Time Provider Department Center   03/11/2018  4:30 PM Lucky CowboyMcKeown, William, MD [facility] None  10/05/2018  3:00 PM Lucky CowboyMcKeown, William, MD [facility] None    ----------------------------------------------------------------------------------------------------------------------  HPI 52 y.o. male  presents for 3 month follow up on hypertension, cholesterol, glucose management, weight and vitamin D deficiency. He currently has daily headaches though to be caffeine withdrawal associated with daily BC powders. He is unwilling to stop this despite understanding headaches are likely caused by excessive use.   he currently continues to smoke; discussed risks associated with smoking, patient is not ready to quit.   he has a diagnosis of anxiety and is currently on xanax 0.5-1mg  PRN, reports symptoms are well controlled on current regimen. he takes it as needed in the evenings if needed for sleep. Taking 3-4 times a week.  BMI is Body mass index is 27.88 kg/m., he has been working on diet and exercise. Wt Readings from Last 3 Encounters:  12/10/17 178 lb (80.7 kg)  12/01/17 175 lb 6.4 oz (79.6 kg)  09/08/17 166 lb 6.4 oz (75.5 kg)   His blood pressure has not been controlled at home, today their BP is BP: (!) 162/102 He stopped taking olmesartan due to dizziness, weakness, joint pain. He also admittedly has taken sudafed today. Advised to avoid this and utilize guaifenesin instead. He has kept lot of BPs at home and presents this - range from 119/81- 154/102.   He does workout (physically intense job). He denies chest pain, shortness of breath, dizziness.    He is prescribed cholesterol medication (atorvastatin 80 mg but has not been taking) and denies myalgias. His cholesterol is not at goal. The cholesterol last visit was:   Lab Results  Component Value Date  CHOL 234 (H) 09/08/2017   HDL 39 (L) 09/08/2017   LDLCALC 164 (H) 09/08/2017   TRIG 162 (H) 09/08/2017   CHOLHDL 6.0 (H) 09/08/2017    He has been working on diet and exercise  for glucose management, and denies increased appetite, nausea, paresthesia of the feet, polydipsia, polyuria, visual disturbances, vomiting and weight loss. Last A1C in the office was:  Lab Results  Component Value Date   HGBA1C 5.1 09/08/2017   Patient is not on Vitamin D supplement despite recommendation   Lab Results  Component Value Date   VD25OH 75 (L) 09/08/2017        Current Medications:  Current Outpatient Medications on File Prior to Visit  Medication Sig  . ALPRAZolam (XANAX) 1 MG tablet TAKE 1/2 TO 1 TABLET 2-3 TIMES A DAY ONLY IF NEEDED FOR ANXIETY. TRY TO LIMIT TO 5 DAYS/WK.  Marland Kitchen Aspirin-Acetaminophen-Caffeine (GOODY HEADACHE PO) Take 1 Package by mouth 2 (two) times daily as needed (for pain).  . predniSONE (DELTASONE) 20 MG tablet 1 tab 3 x day for 3 days, then 1 tab 2 x day for 3 days, then 1 tab 1 x day for 5 days  . VIAGRA 100 MG tablet TAKE 1/2 TO 1 TABLET AS NEEDED.  Marland Kitchen vitamin C (ASCORBIC ACID) 500 MG tablet Take 500 mg by mouth every morning.  Marland Kitchen atorvastatin (LIPITOR) 80 MG tablet TAKE 1 TABLET ONCE DAILY. (Patient not taking: Reported on 12/10/2017)   No current facility-administered medications on file prior to visit.      Allergies:  Allergies  Allergen Reactions  . Lisinopril Cough  . Vicodin [Hydrocodone-Acetaminophen] Other (See Comments)    "makes my skin crawl"     Medical History:  Past Medical History:  Diagnosis Date  . Hyperlipidemia   . Hypertension   . Hypogonadism male   . Vitamin D deficiency    Family history- Reviewed and unchanged Social history- Reviewed and unchanged   Review of Systems:  Review of Systems  Constitutional: Negative for malaise/fatigue and weight loss.  HENT: Negative for hearing loss and tinnitus.   Eyes: Negative for blurred vision and double vision.  Respiratory: Negative for cough, shortness of breath and wheezing.   Cardiovascular: Negative for chest pain, palpitations, orthopnea, claudication and leg  swelling.  Gastrointestinal: Negative for abdominal pain, blood in stool, constipation, diarrhea, heartburn, melena, nausea and vomiting.  Genitourinary: Negative.   Musculoskeletal: Negative for joint pain and myalgias.  Skin: Negative for rash.  Neurological: Negative for dizziness, tingling, sensory change, weakness and headaches.  Endo/Heme/Allergies: Negative for polydipsia.  Psychiatric/Behavioral: Negative.   All other systems reviewed and are negative.     Physical Exam: BP (!) 162/102   Pulse 91   Temp 97.9 F (36.6 C)   Ht 5\' 7"  (1.702 m)   Wt 178 lb (80.7 kg)   SpO2 96%   BMI 27.88 kg/m  Wt Readings from Last 3 Encounters:  12/10/17 178 lb (80.7 kg)  12/01/17 175 lb 6.4 oz (79.6 kg)  09/08/17 166 lb 6.4 oz (75.5 kg)   General Appearance: Well nourished, in no apparent distress. Eyes: PERRLA, EOMs, conjunctiva no swelling or erythema Sinuses: No Frontal/maxillary tenderness ENT/Mouth: Ext aud canals clear, TMs without erythema, bulging. No erythema, swelling, or exudate on post pharynx.  Tonsils not swollen or erythematous. Hearing normal.  Neck: Supple, thyroid normal.  Respiratory: Respiratory effort normal, BS equal bilaterally without rales, rhonchi, wheezing or stridor.  Cardio: RRR with no MRGs. Brisk peripheral  pulses without edema.  Abdomen: Soft, + BS.  Non tender, no guarding, rebound, hernias, masses. Lymphatics: Non tender without lymphadenopathy.  Musculoskeletal: Full ROM, 5/5 strength, Normal gait Skin: Warm, dry without rashes, lesions, ecchymosis.  Neuro: Cranial nerves intact. No cerebellar symptoms.  Psych: Awake and oriented X 3, normal affect, Insight and Judgment appropriate.    Dan Maker, NP 5:11 PM [facility]

## 2017-12-10 ENCOUNTER — Other Ambulatory Visit: Payer: Self-pay | Admitting: Internal Medicine

## 2017-12-10 ENCOUNTER — Ambulatory Visit: Payer: BLUE CROSS/BLUE SHIELD | Admitting: Adult Health

## 2017-12-10 ENCOUNTER — Encounter: Payer: Self-pay | Admitting: Adult Health

## 2017-12-10 VITALS — BP 162/102 | HR 91 | Temp 97.9°F | Ht 67.0 in | Wt 178.0 lb

## 2017-12-10 DIAGNOSIS — E559 Vitamin D deficiency, unspecified: Secondary | ICD-10-CM | POA: Diagnosis not present

## 2017-12-10 DIAGNOSIS — R7309 Other abnormal glucose: Secondary | ICD-10-CM

## 2017-12-10 DIAGNOSIS — E782 Mixed hyperlipidemia: Secondary | ICD-10-CM | POA: Diagnosis not present

## 2017-12-10 DIAGNOSIS — Z79899 Other long term (current) drug therapy: Secondary | ICD-10-CM | POA: Diagnosis not present

## 2017-12-10 DIAGNOSIS — E663 Overweight: Secondary | ICD-10-CM

## 2017-12-10 DIAGNOSIS — I10 Essential (primary) hypertension: Secondary | ICD-10-CM

## 2017-12-10 DIAGNOSIS — F411 Generalized anxiety disorder: Secondary | ICD-10-CM

## 2017-12-10 MED ORDER — LOSARTAN POTASSIUM 100 MG PO TABS: ORAL_TABLET | ORAL | 1 refills | Status: DC

## 2017-12-10 NOTE — Patient Instructions (Addendum)
Try omega 3 supplement for triglycerides Try red yeast rice supplement daily for LDL cholesterol Increase fiber - soluble fiber supplement (benefiber/citrucel)    Possibly try lower dose of vitamin D - try 1000 IU daily      When it comes to diets, agreement about the perfect plan isn't easy to find, even among the experts. Experts at the [employer] developed an idea known as the Healthy Eating Plate. Just imagine a plate divided into logical, healthy portions.  The emphasis is on diet quality:  Load up on vegetables and fruits - one-half of your plate: Aim for color and variety, and remember that potatoes don't count.  Go for whole grains - one-quarter of your plate: Whole wheat, barley, wheat berries, quinoa, oats, brown rice, and foods made with them. If you want pasta, go with whole wheat pasta.  Protein power - one-quarter of your plate: Fish, chicken, beans, and nuts are all healthy, versatile protein sources. Limit red meat - has been strongly associated with multiple cancers including colon cancer as well as increased heart attacks and strokes.   The diet, however, does go beyond the plate, offering a few other suggestions.  Use healthy plant oils, such as olive, canola, soy, corn, sunflower and peanut. Check the labels, and avoid partially hydrogenated oil, which have unhealthy trans fats.  If you're thirsty, drink water. Coffee and tea are good in moderation, but skip sugary drinks and limit milk and dairy products to one or two daily servings.  The type of carbohydrate in the diet is more important than the amount. Some sources of carbohydrates, such as vegetables, fruits, whole grains, and beans-are healthier than others.  Finally, stay active.    Intermittent fasting Research has associated less frequent meals with reduced rates of diabetes as well as colon cancer.   Intermittent fasting is more about strategy than starvation. It's meant to reset  your body in different ways, hopefully with fitness and nutrition changes as a result.  Like any big switchover, though, results may vary when it comes down to the individual level. What works for your friends may not work for you, or vice versa. That's why it's helpful to play around with variations on intermittent fasting and healthy habits and find what works best for you.  WHAT IS INTERMITTENT FASTING AND WHY DO IT?  Intermittent fasting doesn't involve specific foods, but rather, a strict schedule regarding when you eat. Also called "time-restricted eating," the tactic has been praised for its contribution to weight loss, improved body composition, and decreased cravings. Preliminary research also suggests it may be beneficial for glucose tolerance, hormone regulation, better muscle mass and lower body fat.  Part of its appeal is the simplicity of the effort. Unlike some other trends, there's no calculations to intermittent fasting.  You simply eat within a certain block of time, usually a window of 8-10 hours. In the other big block of time - about 14-16 hours, including when you're asleep - you don't eat anything, not even snacks. You can drink water, coffee, tea or any other beverage that doesn't have calories.  For example, if you like having a late dinner, you might skip breakfast and have your first meal at noon and your last meal of the day at 8 p.m., and then not eat until noon again the next day.  IDEAS FOR GETTING STARTED  If you're new to the strategy, it may be helpful to eat within the typical circadian rhythm  and keep eating within daylight hours. This can be especially beneficial if you're looking at intermittent fasting for weight-loss goals.  So first try only eating between 12pm to 8pm.  Outside of this time you may have water, black coffee, and hot tea. You may not eat it drink anything that has carbs, sugars, OR artificial sugars like diet soda.   Like any major eating  and fitness shift, it can take time to find the perfect fit, so don't be afraid to experiment with different options - including ditching intermittent fasting altogether if it's simply not for you. But if it is, you may be surprised by some of the benefits that come along with the strategy.

## 2017-12-11 LAB — BASIC METABOLIC PANEL WITH GFR
BUN: 16 mg/dL (ref 7–25)
CHLORIDE: 103 mmol/L (ref 98–110)
CO2: 29 mmol/L (ref 20–32)
Calcium: 9.2 mg/dL (ref 8.6–10.3)
Creat: 1.29 mg/dL (ref 0.70–1.33)
GFR, Est African American: 73 mL/min/{1.73_m2} (ref >=60)
GFR, Est Non African American: 63 mL/min/{1.73_m2} (ref >=60)
GLUCOSE: 84 mg/dL (ref 65–99)
POTASSIUM: 3.7 mmol/L (ref 3.5–5.3)
SODIUM: 139 mmol/L (ref 135–146)

## 2017-12-11 LAB — HEPATIC FUNCTION PANEL
AG RATIO: 1.8 (calc) (ref 1.0–2.5)
ALKALINE PHOSPHATASE (APISO): 60 U/L (ref 40–115)
ALT: 19 U/L (ref 9–46)
AST: 16 U/L (ref 10–35)
Albumin: 4.2 g/dL (ref 3.6–5.1)
BILIRUBIN INDIRECT: 0.3 mg/dL (ref 0.2–1.2)
Bilirubin, Direct: 0.1 mg/dL (ref 0.0–0.2)
Globulin: 2.3 g/dL (calc) (ref 1.9–3.7)
TOTAL PROTEIN: 6.5 g/dL (ref 6.1–8.1)
Total Bilirubin: 0.4 mg/dL (ref 0.2–1.2)

## 2017-12-11 LAB — CBC WITH DIFFERENTIAL/PLATELET
BASOS ABS: 26 {cells}/uL (ref 0–200)
Basophils Relative: 0.2 %
Eosinophils Absolute: 192 cells/uL (ref 15–500)
Eosinophils Relative: 1.5 %
HEMATOCRIT: 41.4 % (ref 38.5–50.0)
Hemoglobin: 14.6 g/dL (ref 13.2–17.1)
LYMPHS ABS: 3315 {cells}/uL (ref 850–3900)
MCH: 29 pg (ref 27.0–33.0)
MCHC: 35.3 g/dL (ref 32.0–36.0)
MCV: 82.1 fL (ref 80.0–100.0)
MPV: 10.1 fL (ref 7.5–12.5)
Monocytes Relative: 5.3 %
NEUTROS PCT: 67.1 %
Neutro Abs: 8589 cells/uL — ABNORMAL HIGH (ref 1500–7800)
Platelets: 231 10*3/uL (ref 140–400)
RBC: 5.04 10*6/uL (ref 4.20–5.80)
RDW: 13.8 % (ref 11.0–15.0)
Total Lymphocyte: 25.9 %
WBC: 12.8 10*3/uL — ABNORMAL HIGH (ref 3.8–10.8)
WBCMIX: 678 {cells}/uL (ref 200–950)

## 2017-12-11 LAB — LIPID PANEL
Cholesterol: 246 mg/dL — ABNORMAL HIGH (ref <200)
HDL: 52 mg/dL (ref >40)
LDL Cholesterol (Calc): 159 mg/dL (calc) — ABNORMAL HIGH
NON-HDL CHOLESTEROL (CALC): 194 mg/dL — AB (ref <130)
Total CHOL/HDL Ratio: 4.7 (calc) (ref <5.0)
Triglycerides: 195 mg/dL — ABNORMAL HIGH (ref <150)

## 2017-12-11 LAB — TSH: TSH: 0.87 mIU/L (ref 0.40–4.50)

## 2018-02-17 ENCOUNTER — Ambulatory Visit (INDEPENDENT_AMBULATORY_CARE_PROVIDER_SITE_OTHER): Payer: BLUE CROSS/BLUE SHIELD | Admitting: Orthopaedic Surgery

## 2018-02-17 ENCOUNTER — Ambulatory Visit (INDEPENDENT_AMBULATORY_CARE_PROVIDER_SITE_OTHER): Payer: BLUE CROSS/BLUE SHIELD

## 2018-02-17 ENCOUNTER — Encounter (INDEPENDENT_AMBULATORY_CARE_PROVIDER_SITE_OTHER): Payer: Self-pay | Admitting: Orthopaedic Surgery

## 2018-02-17 DIAGNOSIS — M25512 Pain in left shoulder: Secondary | ICD-10-CM

## 2018-02-17 DIAGNOSIS — M25511 Pain in right shoulder: Secondary | ICD-10-CM | POA: Diagnosis not present

## 2018-02-17 DIAGNOSIS — G8929 Other chronic pain: Secondary | ICD-10-CM

## 2018-02-17 MED ORDER — METHYLPREDNISOLONE ACETATE 40 MG/ML IJ SUSP
40.0000 mg | INTRAMUSCULAR | Status: AC | PRN
  Administered 2018-02-17: 40 mg via INTRA_ARTICULAR

## 2018-02-17 MED ORDER — LIDOCAINE HCL 1 % IJ SOLN
3.0000 mL | INTRAMUSCULAR | Status: AC | PRN
  Administered 2018-02-17: 3 mL

## 2018-02-17 MED ORDER — DICLOFENAC SODIUM 1 % TD GEL: 4.0000 g | Freq: Four times a day (QID) | TRANSDERMAL | 2 refills | Status: AC

## 2018-02-17 NOTE — Progress Notes (Signed)
Office Visit Note   Patient: Grant Campbell           Date of Birth: 04-Feb-1966           MRN: [id number] Visit Date: 02/17/2018              Requested by: [name], MD [address] [place], [place] [address] PCP: [name], MD   Assessment & Plan: Visit Diagnoses:  1. Chronic pain of both shoulders     Plan: He is given Thera-Band to work on cuff strengthening exercises as shown.  We will see him back in a couple weeks to check his response to the injections.  Also reassess his overall cuff strength  Follow-Up Instructions: Return in about 2 weeks (around 03/03/2018).   Orders:  Orders Placed This Encounter  Procedures  . Large Joint Inj  . XR Shoulder Right  . XR Shoulder Left   No orders of the defined types were placed in this encounter.     Procedures: Large Joint Inj: bilateral subacromial bursa on 02/17/2018 10:06 AM Indications: pain Details: 22 G 1.5 in needle, superior approach  Arthrogram: No  Medications (Right): 3 mL lidocaine 1 %; 40 mg methylPREDNISolone acetate 40 MG/ML Medications (Left): 3 mL lidocaine 1 %; 40 mg methylPREDNISolone acetate 40 MG/ML Outcome: tolerated well, no immediate complications Procedure, treatment alternatives, risks and benefits explained, specific risks discussed. Consent was given by the patient. Immediately prior to procedure a time out was called to verify the correct patient, procedure, equipment, support staff and site/side marked as required. Patient was prepped and draped in the usual sterile fashion.       Clinical Data: No additional findings.   Subjective: Chief Complaint  Patient presents with  . Right Shoulder - Pain  . Left Shoulder - Pain    HPI Grant Campbell is a 52 year old male seen for at least 4 years.  He is now having bilateral shoulder pain again.  He states pains came back over the last 6 months to a year.  Pain is awakening him.  He does have a left greater than  right pain.  He is tried Aleve with no relief.  He is also tried Voltaren gel which helps some.  No new injury.  No numbness tingling down either arm however does have some pain that radiates down the left arm at times sometimes past the elbow.  Denies any neck pain or stiffness. Review of Systems Please see HPI otherwise negative  Objective: Vital Signs: There were no vitals taken for this visit.  Physical Exam  Constitutional: He is oriented to person, place, and time. He appears well-developed and well-nourished. No distress.  Pulmonary/Chest: Effort normal.  Neurological: He is alert and oriented to person, place, and time.  Skin: He is not diaphoretic.  Psychiatric: He has a normal mood and affect.    Ortho Exam Bilateral shoulders he has positive impingement on the left negative on the right.  Negative liftoff bilaterally negative empty can test bilaterally.  Weakness with external rotation bilaterally internal rotation against resistance 5 out of 5 strength.  Left shoulder is click with internal/external rotation is tender in the bicipital groove area.  They will make muscle bilaterally in the bicep is without deformity Specialty Comments:  No specialty comments available.  Imaging: Xr Shoulder Left  Result Date: 02/17/2018 Left shoulder 3 views: Subacromial space well maintained on the Y view.  Shoulders well located.  No acute fractures bony abnormalities.  Xr Shoulder Right  Result Date: 02/17/2018 Right shoulder 3 views: Humeral heads well located.  No significant arthritic changes humeral head or glenohumeral joint.  Y-view decreased subacromial space.  No acute fracture.    PMFS History: Patient Active Problem List   Diagnosis Date Noted  . Overweight (BMI 25.0-29.9) 07/07/2015  . GERD  06/28/2015  . Poor compliance 06/19/2014  . Other abnormal glucose 06/19/2014  . Medication management 12/13/2013  . Hyperlipidemia 08/01/2013  . Vitamin D deficiency 08/01/2013    . Essential hypertension 08/01/2013  . Anxiety state 08/01/2013   Past Medical History:  Diagnosis Date  . Hyperlipidemia   . Hypertension   . Hypogonadism male   . Vitamin D deficiency     Family History  Problem Relation Age of Onset  . Heart attack Mother   . Kidney failure Father   . Hypertension Brother   . Hypothyroidism Brother   . Hyperlipidemia Brother     Past Surgical History:  Procedure Laterality Date  . FINGER SURGERY    . ROTATOR CUFF REPAIR     Social History   Occupational History  . Not on file  Tobacco Use  . Smoking status: Current Every Day Smoker    Packs/day: 1.00    Types: Cigarettes  . Smokeless tobacco: Never Used  Substance and Sexual Activity  . Alcohol use: Yes    Alcohol/week: 4.2 oz    Types: 7 Standard drinks or equivalent per week    Comment: occasional  . Drug use: No  . Sexual activity: Not on file

## 2018-02-17 NOTE — Addendum Note (Signed)
Addended by: Richardean CanalLARK, GILBERT on: 02/17/2018 11:14 AM   Modules accepted: Orders

## 2018-02-22 ENCOUNTER — Telehealth (INDEPENDENT_AMBULATORY_CARE_PROVIDER_SITE_OTHER): Payer: Self-pay | Admitting: Orthopaedic Surgery

## 2018-02-22 NOTE — Telephone Encounter (Signed)
Grant Campbell with [employer] called advised she received a prior auth for the Rx (Diclofenac Sodium 1% gel) on 02/18/18 and none of the questions were answered. The number to contact Grant Campbell is [phone]

## 2018-02-23 NOTE — Telephone Encounter (Signed)
Filled out form and faxed

## 2018-03-03 ENCOUNTER — Other Ambulatory Visit (INDEPENDENT_AMBULATORY_CARE_PROVIDER_SITE_OTHER): Payer: Self-pay

## 2018-03-11 ENCOUNTER — Ambulatory Visit: Payer: Self-pay | Admitting: Internal Medicine

## 2018-03-15 ENCOUNTER — Other Ambulatory Visit: Payer: Self-pay | Admitting: Internal Medicine

## 2018-03-15 DIAGNOSIS — N528 Other male erectile dysfunction: Secondary | ICD-10-CM

## 2018-06-01 DIAGNOSIS — H524 Presbyopia: Secondary | ICD-10-CM | POA: Diagnosis not present

## 2018-06-01 DIAGNOSIS — H52203 Unspecified astigmatism, bilateral: Secondary | ICD-10-CM | POA: Diagnosis not present

## 2018-06-11 ENCOUNTER — Other Ambulatory Visit: Payer: Self-pay | Admitting: Internal Medicine

## 2018-10-05 ENCOUNTER — Encounter: Payer: Self-pay | Admitting: Internal Medicine

## 2018-10-25 ENCOUNTER — Encounter: Payer: Self-pay | Admitting: Internal Medicine

## 2018-10-25 NOTE — Progress Notes (Signed)
[facility]   Grant [facility], M.D.     Grant Campbell. Grant Campbell, P.A.-C Grant Gaudier, DNP [facility]                [address]                [place], [place]. [address] Telephone [phone] Telefax [phone] Annual  Screening/Preventative Visit  & Comprehensive Evaluation & Examination     This very nice [name] presents for a Screening /Preventative Visit & comprehensive evaluation and management of multiple medical co-morbidities.  Patient has been followed for HTN, HLD, T2_NIDDM  Prediabetes and Vitamin D Deficiency.     Patient expressed feels of "being mad & angry" w/o any provocation - just feeling angry & critical of people in general.      HTN predates circa 2010. Patient's BP has been controlled at home.  Today's BP: was not at goal - 126/90. Patient denies any cardiac symptoms as chest pain, palpitations, shortness of breath, dizziness or ankle swelling.     Patient's hyperlipidemia is not controlled with diet and patient has been reticent to take Statin medications. Patient denies myalgias or other medication SE's. Last lipids were not at goal: Lab Results  Component Value Date   CHOL 246 (H) 12/10/2017   HDL 52 12/10/2017   LDLCALC 159 (H) 12/10/2017   TRIG 195 (H) 12/10/2017   CHOLHDL 4.7 12/10/2017      Patient has hx/o prediabetes (A1c 5.7% / 2010)  and patient denies reactive hypoglycemic symptoms, visual blurring, diabetic polys or aresthesias. Last A1c was at goal: Lab Results  Component Value Date   HGBA1C 5.1 09/08/2017       Finally, patient has history of Vitamin D Deficiency ("35" / 2010) and last vitamin D was even lower  Lab Results  Component Value Date   VD25OH 36 (L) 09/08/2017   Current Outpatient Medications on File Prior to Visit  Medication Sig  . ALPRAZolam (XANAX) 1 MG tablet TAKE 1/2 TO 1 TABLET 2-3 TIMES A DAY ONLY IF NEEDED FOR ANXIETY. TRY TO LIMIT TO 5 DAYS/WK.  Marland Kitchen  Aspirin-Acetaminophen-Caffeine (GOODY HEADACHE PO) Take 1 Package by mouth 2 (two) times daily as needed (for pain).  Marland Kitchen diclofenac sodium (VOLTAREN) 1 % GEL Apply 4 g topically 4 (four) times daily. Bilateral shoulders  . losartan (COZAAR) 100 MG tablet TAKE 1/2 TO 1 TABLET ONCE DAILY FOR HIGH BLOOD PRESSURE.  . sildenafil (VIAGRA) 100 MG tablet Take 1/2 to 1 tablet daily as needed for XXXX  . vitamin C (ASCORBIC ACID) 500 MG tablet Take 500 mg by mouth every morning.  Marland Kitchen atorvastatin (LIPITOR) 80 MG tablet TAKE 1 TABLET ONCE DAILY. (Patient not taking: Reported on 10/26/2018)   No current facility-administered medications on file prior to visit.    Allergies  Allergen Reactions  . Lisinopril Cough  . Olmesartan Other (See Comments)    Weakness, joint pain  . Vicodin [Hydrocodone-Acetaminophen] Other (See Comments)    "makes my skin crawl"   Past Medical History:  Diagnosis Date  . Hyperlipidemia   . Hypertension   . Hypogonadism male   . Vitamin D deficiency    Health Maintenance  Topic Date Due  . HIV Screening  11/15/1980  . COLONOSCOPY  11/16/2015  . INFLUENZA VACCINE  04/01/2018  . TETANUS/TDAP  07/31/2026   Immunization History  Administered Date(s) Administered  . DTaP 09/02/2007  . PPD Test 06/19/2014, 06/28/2015,  07/31/2016   Last Colon -  Past Surgical History:  Procedure Laterality Date  . FINGER SURGERY    . ROTATOR CUFF REPAIR     Family History  Problem Relation Age of Onset  . Heart attack Mother   . Kidney failure Father   . Hypertension Brother   . Hypothyroidism Brother   . Hyperlipidemia Brother    Social History   Socioeconomic History  . Marital status: Married    Spouse name: Not on file  . Number of children: None  Occupational History  . Construction Superintendant  Tobacco Use  . Smoking status: Current Every Day Smoker    Packs/day: 1.00    Types: Cigarettes  . Smokeless tobacco: Never Used  Substance and Sexual Activity  .  Alcohol use: Yes    Alcohol/week: 7.0 standard drinks    Types: 7 Standard drinks or equivalent per week    Comment: occasional  . Drug use: No  . Sexual activity: Not on file    ROS Constitutional: Denies fever, chills, weight loss/gain, headaches, insomnia,  night sweats or change in appetite. Does c/o fatigue. Eyes: Denies redness, blurred vision, diplopia, discharge, itchy or watery eyes.  ENT: Denies discharge, congestion, post nasal drip, epistaxis, sore throat, earache, hearing loss, dental pain, Tinnitus, Vertigo, Sinus pain or snoring.  Cardio: Denies chest pain, palpitations, irregular heartbeat, syncope, dyspnea, diaphoresis, orthopnea, PND, claudication or edema Respiratory: denies cough, dyspnea, DOE, pleurisy, hoarseness, laryngitis or wheezing.  Gastrointestinal: Denies dysphagia, heartburn, reflux, water brash, pain, cramps, nausea, vomiting, bloating, diarrhea, constipation, hematemesis, melena, hematochezia, jaundice or hemorrhoids Genitourinary: Denies dysuria, frequency, urgency, nocturia, hesitancy, discharge, hematuria or flank pain Musculoskeletal: Denies arthralgia, myalgia, stiffness, Jt. Swelling, pain, limp or strain/sprain. Denies Falls. Skin: Denies puritis, rash, hives, warts, acne, eczema or change in skin lesion Neuro: No weakness, tremor, incoordination, spasms, paresthesia or pain Psychiatric: Denies confusion, memory loss or sensory loss. Denies Depression. Endocrine: Denies change in weight, skin, hair change, nocturia, and paresthesia, diabetic polys, visual blurring or hyper / hypo glycemic episodes.  Heme/Lymph: No excessive bleeding, bruising or enlarged lymph nodes.  Physical Exam  BP 126/90   P 80   T 97.2 F    Resp 16   Ht 5\' 8"     Wt 175 lb 12.8 oz  BMI 26.73   BP rechecked at 144/97 and 157/97  General Appearance: Well nourished and well groomed and in no apparent distress.  Eyes: PERRLA, EOMs, conjunctiva no swelling or erythema,  normal fundi and vessels. Sinuses: No frontal/maxillary tenderness ENT/Mouth: EACs patent / TMs  nl. Nares clear without erythema, swelling, mucoid exudates. Oral hygiene is good. No erythema, swelling, or exudate. Tongue normal, non-obstructing. Tonsils not swollen or erythematous. Hearing normal.  Neck: Supple, thyroid not palpable. No bruits, nodes or JVD. Respiratory: Respiratory effort normal.  BS equal and clear bilateral without rales, rhonci, wheezing or stridor. Cardio: Heart sounds are normal with regular rate and rhythm and no murmurs, rubs or gallops. Peripheral pulses are normal and equal bilaterally without edema. No aortic or femoral bruits. Chest: symmetric with normal excursions and percussion.  Abdomen: Soft, with Nl bowel sounds. Nontender, no guarding, rebound, hernias, masses, or organomegaly.  Lymphatics: Non tender without lymphadenopathy.  Musculoskeletal: Full ROM all peripheral extremities, joint stability, 5/5 strength, and normal gait. Skin: Warm and dry without rashes, lesions, cyanosis, clubbing or  ecchymosis.  Neuro: Cranial nerves intact, reflexes equal bilaterally. Normal muscle tone, no cerebellar symptoms. Sensation intact.  Pysch: Alert and  oriented X 3 with normal affect, insight and judgment appropriate.   Assessment and Plan  1. Annual Preventative/Screening Exam   2. Essential hypertension  - patient is reticent to take BP meds , but did agree to monitor BP's at various times of the day & ROV 1 month with BP list.  - EKG 12-Lead - [place], RETROPERITNL ABD,  LTD - Urinalysis, Routine w reflex microscopic - Microalbumin / creatinine urine ratio - CBC with Differential/Platelet - COMPLETE METABOLIC PANEL WITH GFR - Magnesium - TSH  3. Hyperlipidemia, mixed  - EKG 12-Lead - [place], RETROPERITNL ABD,  LTD - Lipid panel - TSH  4. Prediabetes  - EKG 12-Lead - [place], RETROPERITNL ABD,  LTD - Hemoglobin A1c - Insulin, random  5. Vitamin D  deficiency  - VITAMIN D 25 Hydroxyl  6. Gastroesophageal reflux disease  - CBC with Differential/Platelet  7. Screening for colorectal cancer  - POC Hemoccult Bld/Stl  8. Prostate cancer screening  - PSA  9. Screening for ischemic heart disease  - EKG 12-Lead  10. Screening for AAA (aortic abdominal aneurysm)  - [place], RETROPERITNL ABD,  LTD  11. FHx: heart disease  - EKG 12-Lead - [place], RETROPERITNL ABD,  LTD  12. Smoker  - EKG 12-Lead - [place], RETROPERITNL ABD,  LTD  13. Screening examination for pulmonary tuberculosis  - TB Skin Test - Refused by patient  14. Fatigue  - Iron,Total/Total Iron Binding Cap - Vitamin B12 - Testosterone - CBC with Differential/Platelet - TSH  15. Medication management  - Urinalysis, Routine w reflex microscopic - Microalbumin / creatinine urine ratio - CBC with Differential/Platelet - COMPLETE METABOLIC PANEL WITH GFR - Magnesium - Lipid panel - TSH - Hemoglobin A1c - Insulin, random - VITAMIN D 25 Hydroxyl      After long discussion of meds, patient finally agreed to try low dose of Zoloft for 1 month.          Patient was counseled in prudent diet, weight control to achieve/maintain BMI less than 25, BP monitoring, regular exercise and medications as discussed.  Discussed med effects and SE's. Routine screening labs and tests as requested with regular follow-up as recommended. Over 40 minutes of exam, counseling, chart review and high complex critical decision making was performed

## 2018-10-25 NOTE — Patient Instructions (Signed)

## 2018-10-26 ENCOUNTER — Ambulatory Visit: Payer: BLUE CROSS/BLUE SHIELD | Admitting: Internal Medicine

## 2018-10-26 ENCOUNTER — Ambulatory Visit
Admission: RE | Admit: 2018-10-26 | Discharge: 2018-10-26 | Disposition: A | Discharge status: Home / Self Care | Payer: BLUE CROSS/BLUE SHIELD | Source: Ambulatory Visit | Attending: Internal Medicine | Admitting: Internal Medicine

## 2018-10-26 VITALS — BP 126/90 | HR 80 | Temp 97.2°F | Resp 16 | Ht 68.0 in | Wt 175.8 lb

## 2018-10-26 DIAGNOSIS — Z111 Encounter for screening for respiratory tuberculosis: Secondary | ICD-10-CM

## 2018-10-26 DIAGNOSIS — R5383 Other fatigue: Secondary | ICD-10-CM

## 2018-10-26 DIAGNOSIS — Z1389 Encounter for screening for other disorder: Secondary | ICD-10-CM | POA: Diagnosis not present

## 2018-10-26 DIAGNOSIS — E559 Vitamin D deficiency, unspecified: Secondary | ICD-10-CM | POA: Diagnosis not present

## 2018-10-26 DIAGNOSIS — Z136 Encounter for screening for cardiovascular disorders: Secondary | ICD-10-CM

## 2018-10-26 DIAGNOSIS — Z1212 Encounter for screening for malignant neoplasm of rectum: Secondary | ICD-10-CM

## 2018-10-26 DIAGNOSIS — N401 Enlarged prostate with lower urinary tract symptoms: Secondary | ICD-10-CM

## 2018-10-26 DIAGNOSIS — Z0000 Encounter for general adult medical examination without abnormal findings: Secondary | ICD-10-CM | POA: Diagnosis not present

## 2018-10-26 DIAGNOSIS — Z1329 Encounter for screening for other suspected endocrine disorder: Secondary | ICD-10-CM

## 2018-10-26 DIAGNOSIS — Z13220 Encounter for screening for lipoid disorders: Secondary | ICD-10-CM | POA: Diagnosis not present

## 2018-10-26 DIAGNOSIS — Z79899 Other long term (current) drug therapy: Secondary | ICD-10-CM

## 2018-10-26 DIAGNOSIS — Z130 Encounter for screening for diseases of the blood and blood-forming organs and certain disorders involving the immune mechanism: Secondary | ICD-10-CM

## 2018-10-26 DIAGNOSIS — R350 Frequency of micturition: Secondary | ICD-10-CM | POA: Diagnosis not present

## 2018-10-26 DIAGNOSIS — F17200 Nicotine dependence, unspecified, uncomplicated: Secondary | ICD-10-CM

## 2018-10-26 DIAGNOSIS — I10 Essential (primary) hypertension: Secondary | ICD-10-CM | POA: Diagnosis not present

## 2018-10-26 DIAGNOSIS — Z0001 Encounter for general adult medical examination with abnormal findings: Secondary | ICD-10-CM

## 2018-10-26 DIAGNOSIS — Z1211 Encounter for screening for malignant neoplasm of colon: Secondary | ICD-10-CM

## 2018-10-26 DIAGNOSIS — Z8249 Family history of ischemic heart disease and other diseases of the circulatory system: Secondary | ICD-10-CM

## 2018-10-26 DIAGNOSIS — K219 Gastro-esophageal reflux disease without esophagitis: Secondary | ICD-10-CM

## 2018-10-26 DIAGNOSIS — Z125 Encounter for screening for malignant neoplasm of prostate: Secondary | ICD-10-CM

## 2018-10-26 DIAGNOSIS — Z131 Encounter for screening for diabetes mellitus: Secondary | ICD-10-CM

## 2018-10-26 DIAGNOSIS — E782 Mixed hyperlipidemia: Secondary | ICD-10-CM

## 2018-10-26 DIAGNOSIS — R7303 Prediabetes: Secondary | ICD-10-CM

## 2018-10-26 MED ORDER — SERTRALINE HCL 50 MG PO TABS: ORAL_TABLET | ORAL | 1 refills | Status: DC

## 2018-10-27 LAB — URINALYSIS, ROUTINE W REFLEX MICROSCOPIC
Bilirubin Urine: NEGATIVE
Glucose, UA: NEGATIVE
Hgb urine dipstick: NEGATIVE
KETONES UR: NEGATIVE
LEUKOCYTE UA: NEGATIVE
NITRITE: NEGATIVE
Protein, ur: NEGATIVE
Specific Gravity, Urine: 1.003 (ref 1.001–1.03)
pH: 6.5 (ref 5.0–8.0)

## 2018-10-27 LAB — INSULIN, RANDOM: Insulin: 5.8 u[IU]/mL

## 2018-10-27 LAB — CBC WITH DIFFERENTIAL/PLATELET
Absolute Monocytes: 472 cells/uL (ref 200–950)
Basophils Absolute: 80 cells/uL (ref 0–200)
Basophils Relative: 0.9 %
EOS ABS: 757 {cells}/uL — AB (ref 15–500)
EOS PCT: 8.5 %
HCT: 47.3 % (ref 38.5–50.0)
Hemoglobin: 15.8 g/dL (ref 13.2–17.1)
Lymphs Abs: 1967 cells/uL (ref 850–3900)
MCH: 28 pg (ref 27.0–33.0)
MCHC: 33.4 g/dL (ref 32.0–36.0)
MCV: 83.7 fL (ref 80.0–100.0)
MONOS PCT: 5.3 %
MPV: 11 fL (ref 7.5–12.5)
Neutro Abs: 5625 cells/uL (ref 1500–7800)
Neutrophils Relative %: 63.2 %
Platelets: 200 10*3/uL (ref 140–400)
RBC: 5.65 10*6/uL (ref 4.20–5.80)
RDW: 13.5 % (ref 11.0–15.0)
Total Lymphocyte: 22.1 %
WBC: 8.9 10*3/uL (ref 3.8–10.8)

## 2018-10-27 LAB — TESTOSTERONE: Testosterone: 552 ng/dL (ref 250–827)

## 2018-10-27 LAB — IRON, TOTAL/TOTAL IRON BINDING CAP
%SAT: 23 % (ref 20–48)
Iron: 75 ug/dL (ref 50–180)
TIBC: 330 mcg/dL (calc) (ref 250–425)

## 2018-10-27 LAB — MICROALBUMIN / CREATININE URINE RATIO
Creatinine, Urine: 21 mg/dL (ref 20–320)
Microalb, Ur: <0.2 mg/dL

## 2018-10-27 LAB — COMPLETE METABOLIC PANEL WITH GFR
AG RATIO: 1.8 (calc) (ref 1.0–2.5)
ALT: 10 U/L (ref 9–46)
AST: 14 U/L (ref 10–35)
Albumin: 4.3 g/dL (ref 3.6–5.1)
Alkaline phosphatase (APISO): 74 U/L (ref 35–144)
BUN: 12 mg/dL (ref 7–25)
CO2: 28 mmol/L (ref 20–32)
Calcium: 9.6 mg/dL (ref 8.6–10.3)
Chloride: 105 mmol/L (ref 98–110)
Creat: 1.14 mg/dL (ref 0.70–1.33)
GFR, Est African American: 85 mL/min/{1.73_m2} (ref >=60)
GFR, Est Non African American: 74 mL/min/{1.73_m2} (ref >=60)
Globulin: 2.4 g/dL (calc) (ref 1.9–3.7)
Glucose, Bld: 91 mg/dL (ref 65–99)
Potassium: 4.4 mmol/L (ref 3.5–5.3)
Sodium: 140 mmol/L (ref 135–146)
Total Bilirubin: 0.5 mg/dL (ref 0.2–1.2)
Total Protein: 6.7 g/dL (ref 6.1–8.1)

## 2018-10-27 LAB — TSH: TSH: 1.05 m[IU]/L (ref 0.40–4.50)

## 2018-10-27 LAB — LIPID PANEL
Cholesterol: 306 mg/dL — ABNORMAL HIGH (ref <200)
HDL: 39 mg/dL — ABNORMAL LOW (ref >=40)
LDL Cholesterol (Calc): 224 mg/dL (calc) — ABNORMAL HIGH
Non-HDL Cholesterol (Calc): 267 mg/dL (calc) — ABNORMAL HIGH (ref <130)
Total CHOL/HDL Ratio: 7.8 (calc) — ABNORMAL HIGH (ref <5.0)
Triglycerides: 231 mg/dL — ABNORMAL HIGH (ref <150)

## 2018-10-27 LAB — VITAMIN D 25 HYDROXY (VIT D DEFICIENCY, FRACTURES): Vit D, 25-Hydroxy: 20 ng/mL — ABNORMAL LOW (ref 30–100)

## 2018-10-27 LAB — HEMOGLOBIN A1C
Hgb A1c MFr Bld: 5.1 % of total Hgb (ref <5.7)
Mean Plasma Glucose: 100 (calc)
eAG (mmol/L): 5.5 (calc)

## 2018-10-27 LAB — PSA: PSA: 0.6 ng/mL (ref <=4.0)

## 2018-10-27 LAB — VITAMIN B12: Vitamin B-12: 379 pg/mL (ref 200–1100)

## 2018-10-27 LAB — MAGNESIUM: Magnesium: 2.2 mg/dL (ref 1.5–2.5)

## 2018-10-28 ENCOUNTER — Encounter: Payer: Self-pay | Admitting: *Deleted

## 2018-11-10 ENCOUNTER — Telehealth: Payer: Self-pay | Admitting: *Deleted

## 2018-11-10 ENCOUNTER — Other Ambulatory Visit: Payer: Self-pay | Admitting: *Deleted

## 2018-11-10 MED ORDER — SERTRALINE HCL 50 MG PO TABS: ORAL_TABLET | ORAL | 1 refills | Status: DC

## 2018-11-10 NOTE — Telephone Encounter (Signed)
New RX for Zoloft was sent to the patient's pharmacy on 10/26/2018.  Per his spouse, he did not want RX and refused at the pharmacy. He has decided to start the RX and the medication was resent to [facility], per Dr Oneta Rack.

## 2018-11-15 ENCOUNTER — Other Ambulatory Visit: Payer: Self-pay | Admitting: Internal Medicine

## 2018-12-03 ENCOUNTER — Ambulatory Visit: Payer: Self-pay | Admitting: Internal Medicine

## 2018-12-09 ENCOUNTER — Ambulatory Visit: Payer: Self-pay | Admitting: Internal Medicine

## 2019-01-03 NOTE — Progress Notes (Signed)
Subjective:    Patient ID: Grant Campbell, male    DOB: 1965/09/20, 53 y.o.   MRN: [id number]  HPI    This very nice 53 yo MWM with HTN, HLD,  Prediabetes and Vitamin D Deficiency returns for belated 2 month F/U after recommending starting Zoloft 10/26/2018 for irritability & anger issues. Labs 2 month ago found extremely low Vit D 20 and very high risk Lipids with total Chol 306, Trigs 231 and LDL Chol 224. Patient is adverse to taking Chol meds.  Also, Vit B12 was low at 379. Patient has NOT started the Zoloft.      Patient does request  med Rx to have on hold for respiratory infection or infectious diarrhea to have when he travels out of town.   Medication Sig  . Aspirin-Acetaminophen-Caffeine (GOODY HEADACHE PO) Take 1 Package by mouth 2 (two) times daily as needed (for pain).  Marland Kitchen diclofenac sodium (VOLTAREN) 1 % GEL Apply 4 g topically 4 (four) times daily. Bilateral shoulders  . losartan (COZAAR) 100 MG tablet TAKE 1/2 TO 1 TABLET ONCE DAILY FOR HIGH BLOOD PRESSURE.  . sildenafil (VIAGRA) 100 MG tablet Take 1/2 to 1 tablet daily as needed for XXXX  . vitamin C (ASCORBIC ACID) 500 MG tablet Take 500 mg by mouth every morning.  Marland Kitchen atorvastatin (LIPITOR) 80 MG tablet TAKE 1 TABLET ONCE DAILY. (Patient not taking: Reported on 10/26/2018)  . sertraline (ZOLOFT) 50 MG tablet Take 1 tablet daily (Patient not taking: Reported on 01/04/2019)  . ALPRAZolam (XANAX) 1 MG tablet TAKE 1/2 TO 1 TABLET 2-3 TIMES A DAY ONLY IF NEEDED FOR ANXIETY. TRY TO LIMIT TO 5 DAYS/WK.   Allergies  Allergen Reactions  . Lisinopril Cough  . Olmesartan Other (See Comments)    Weakness, joint pain  . Vicodin [Hydrocodone-Acetaminophen] Other (See Comments)    "makes my skin crawl"   Past Medical History:  Diagnosis Date  . Hyperlipidemia   . Hypertension   . Hypogonadism male   . Vitamin D deficiency    Past Surgical History:  Procedure Laterality Date  . FINGER SURGERY    . ROTATOR CUFF REPAIR     Review of  Systems   10 point systems review negative except as above.    Objective:   Physical Exam  BP 130/86   Pulse 72   Temp (!) 97.1 F (36.2 C)   Resp 16   Ht 5\' 8"  (1.727 m)   Wt 168 lb 6.4 oz (76.4 kg)   BMI 25.61 kg/m   HEENT - WNL. Neck - supple.  Chest - Clear equal BS. Cor - Nl HS. RRR w/o sig MGR. PP 1(+). No edema. MS- FROM w/o deformities.  Gait Nl. Neuro -  Nl w/o focal abnormalities.   Assessment & Plan:   1. Essential hypertension  2. Hyperlipidemia, mixed  3. Sinusitis - azithromycin 250 MG tablet; Take 2 tablets  on  Day 1,  followed by 1 tablet  daily on Days 2 through 5  ((For sinusitis - J32.9))  Dispense: 6 each; Refill: 1 4. Diarrhea of infectious origin - ciprofloxacin (CIPRO) 500 MG tablet; Take 1 tablet 2 x /day with meal for Bacterial Diarrhea  ((A09))  Dispense: 28 tablet; Refill: 0 - metroNIDAZOLE (FLAGYL) 500 MG tablet; Take 1 tablet 3 x /day with meal for Bacterial Diarrhea ((A09))  Dispense: 21 tablet; Refill: 0    Long discussion with patient to consider taking Zoloft at least 6 weeks to assess  potential benefit  Over 20 minutes of exam, counseling, chart review and decision making was performed.

## 2019-01-04 ENCOUNTER — Ambulatory Visit: Payer: BLUE CROSS/BLUE SHIELD | Admitting: Internal Medicine

## 2019-01-04 ENCOUNTER — Other Ambulatory Visit: Payer: Self-pay

## 2019-01-04 ENCOUNTER — Encounter: Payer: Self-pay | Admitting: Internal Medicine

## 2019-01-04 VITALS — BP 130/86 | HR 72 | Temp 97.1°F | Resp 16 | Ht 68.0 in | Wt 168.4 lb

## 2019-01-04 DIAGNOSIS — A09 Infectious gastroenteritis and colitis, unspecified: Secondary | ICD-10-CM

## 2019-01-04 DIAGNOSIS — J329 Chronic sinusitis, unspecified: Secondary | ICD-10-CM | POA: Diagnosis not present

## 2019-01-04 DIAGNOSIS — I10 Essential (primary) hypertension: Secondary | ICD-10-CM | POA: Diagnosis not present

## 2019-01-04 DIAGNOSIS — E782 Mixed hyperlipidemia: Secondary | ICD-10-CM | POA: Diagnosis not present

## 2019-01-04 MED ORDER — METRONIDAZOLE 500 MG PO TABS: ORAL_TABLET | ORAL | 0 refills | Status: DC

## 2019-01-04 MED ORDER — AZITHROMYCIN 250 MG PO TABS: ORAL_TABLET | ORAL | 1 refills | Status: DC

## 2019-01-04 MED ORDER — CIPROFLOXACIN HCL 500 MG PO TABS: ORAL_TABLET | ORAL | 0 refills | Status: DC

## 2019-01-04 MED ORDER — ALPRAZOLAM 1 MG PO TABS: ORAL_TABLET | ORAL | 0 refills | Status: DC

## 2019-01-06 ENCOUNTER — Other Ambulatory Visit: Payer: Self-pay | Admitting: Internal Medicine

## 2019-01-06 MED ORDER — ALPRAZOLAM 1 MG PO TABS: ORAL_TABLET | ORAL | 0 refills | Status: DC

## 2019-02-17 ENCOUNTER — Other Ambulatory Visit: Payer: Self-pay | Admitting: Physician Assistant

## 2019-03-18 ENCOUNTER — Other Ambulatory Visit: Payer: Self-pay | Admitting: Internal Medicine

## 2019-03-18 DIAGNOSIS — N528 Other male erectile dysfunction: Secondary | ICD-10-CM

## 2019-04-18 ENCOUNTER — Other Ambulatory Visit: Payer: Self-pay | Admitting: Internal Medicine

## 2019-04-18 ENCOUNTER — Other Ambulatory Visit: Payer: Self-pay | Admitting: Adult Health

## 2019-04-18 DIAGNOSIS — N528 Other male erectile dysfunction: Secondary | ICD-10-CM

## 2019-04-18 MED ORDER — SILDENAFIL CITRATE 100 MG PO TABS: ORAL_TABLET | ORAL | 99 refills | Status: DC

## 2019-04-21 NOTE — Progress Notes (Signed)
FOLLOW UP  Assessment and Plan:   Screening for HIV (human immunodeficiency virus) -     HIV Antibody (routine testing w rflx)  Anemia, unspecified type -     Iron,Total/Total Iron Binding Cap -     Vitamin B12  Hypertension Monitor blood pressure at home; patient to call if consistently greater than 130/80 Continue DASH diet.   Reminder to go to the ER if any CP, SOB, nausea, dizziness, severe HA, changes vision/speech, left arm numbness and tingling and jaw pain.  Cholesterol Continue to recommend low cholesterol diet and exercise.  Check lipid panel.   Obesity with co morbidities Long discussion about weight loss, diet, and exercise Recommended diet heavy in fruits and veggies and low in animal meats, cheeses, and dairy products, appropriate calorie intake Discussed ideal weight for height  Will follow up in 3 months  Vitamin D Def Not currently on supplement due to not tolerating previously recommended 5000 IU - agreeable to restarting at low dose - suggested 1000 IU and titrate up as tolerated Defer Vit D level  Anxiety  Well managed by current regimen; currently uses xanax <5 days weekly Stress management techniques discussed, increase water, good sleep hygiene discussed, increase exercise, and increase veggies.    Continue diet and meds as discussed. Further disposition pending results of labs. Discussed med's effects and SE's.   Over 30 minutes of exam, counseling, chart review, and critical decision making was performed.   Future Appointments  Date Time Provider [place]  11/23/2019  2:00 PM Unk Pinto, MD [facility] None    ----------------------------------------------------------------------------------------------------------------------  HPI 53 y.o. male  presents for 3 month follow up on hypertension, cholesterol, glucose management, weight and vitamin D deficiency.  Patient was reluctant to wear mask but did put one on when asked nicely.     He had b12 and iron def last visit. Has not had colonoscopy  he currently continues to smoke; discussed risks associated with smoking, patient is not ready to quit.   he has a diagnosis of anxiety and is currently on xanax 0.5-1mg  PRN, reports symptoms are well controlled on current regimen. he takes it as needed in the evenings if needed for sleep. Taking 3-4 times a week.  BMI is Body mass index is 25.09 kg/m., he has been working on diet and exercise. Wt Readings from Last 3 Encounters:  04/27/19 165 lb (74.8 kg)  01/04/19 168 lb 6.4 oz (76.4 kg)  10/26/18 175 lb 12.8 oz (79.7 kg)   His blood pressure has not been controlled at home, today their BP is BP: 124/88  He is on 1/2 -1 a day, checks BP at home.   He does workout (physically intense job). He denies chest pain, shortness of breath, dizziness.    He is prescribed cholesterol medication (atorvastatin 80 mg but has not been taking) and denies myalgias. His cholesterol is not at goal. The cholesterol last visit was:   Lab Results  Component Value Date   CHOL 306 (H) 10/26/2018   HDL 39 (L) 10/26/2018   LDLCALC 224 (H) 10/26/2018   TRIG 231 (H) 10/26/2018   CHOLHDL 7.8 (H) 10/26/2018    He has been working on diet and exercise for glucose management, and denies increased appetite, nausea, paresthesia of the feet, polydipsia, polyuria, visual disturbances, vomiting and weight loss. Last A1C in the office was:  Lab Results  Component Value Date   HGBA1C 5.1 10/26/2018   Patient is on vitamin D daily, what is  on the counter.  Lab Results  Component Value Date   VD25OH 20 (L) 10/26/2018        Current Medications:  Current Outpatient Medications on File Prior to Visit  Medication Sig  . ALPRAZolam (XANAX) 1 MG tablet Take 1/2-1 tablet 2 - 3 x /day ONLY if needed for Anxiety Attack &  limit to 5 days /week to avoid addiction  . Aspirin-Acetaminophen-Caffeine (GOODY HEADACHE PO) Take 1 Package by mouth 2 (two) times  daily as needed (for pain).  Marland Kitchen. atorvastatin (LIPITOR) 80 MG tablet TAKE 1 TABLET ONCE DAILY.  Marland Kitchen. diclofenac sodium (VOLTAREN) 1 % GEL Apply 4 g topically 4 (four) times daily. Bilateral shoulders  . losartan (COZAAR) 100 MG tablet TAKE 1/2 TO 1 TABLET ONCE DAILY FOR HIGH BLOOD PRESSURE.  . metroNIDAZOLE (FLAGYL) 500 MG tablet Take 1 tablet 3 x /day with meal for Bacterial Diarrhea ((A09))  . sertraline (ZOLOFT) 50 MG tablet Take 1 tablet daily  . sildenafil (VIAGRA) 100 MG tablet Take 1/2 to 1 tablet Daily as needed for XXXX  . vitamin C (ASCORBIC ACID) 500 MG tablet Take 500 mg by mouth every morning.   No current facility-administered medications on file prior to visit.      Allergies:  Allergies  Allergen Reactions  . Lisinopril Cough  . Olmesartan Other (See Comments)    Weakness, joint pain  . Vicodin [Hydrocodone-Acetaminophen] Other (See Comments)    "makes my skin crawl"     Medical History:  Past Medical History:  Diagnosis Date  . Hyperlipidemia   . Hypertension   . Hypogonadism male   . Vitamin D deficiency    Family history- Reviewed and unchanged Social history- Reviewed and unchanged   Review of Systems:  Review of Systems  Constitutional: Negative for malaise/fatigue and weight loss.  HENT: Negative for hearing loss and tinnitus.   Eyes: Negative for blurred vision and double vision.  Respiratory: Negative for cough, shortness of breath and wheezing.   Cardiovascular: Negative for chest pain, palpitations, orthopnea, claudication and leg swelling.  Gastrointestinal: Negative for abdominal pain, blood in stool, constipation, diarrhea, heartburn, melena, nausea and vomiting.  Genitourinary: Negative.   Musculoskeletal: Negative for joint pain and myalgias.  Skin: Negative for rash.  Neurological: Negative for dizziness, tingling, sensory change, weakness and headaches.  Endo/Heme/Allergies: Negative for polydipsia.  Psychiatric/Behavioral: Negative.   All  other systems reviewed and are negative.     Physical Exam: BP 124/88   Pulse (!) 101   Temp 98.1 F (36.7 C)   Ht 5\' 8"  (1.727 m)   Wt 165 lb (74.8 kg)   SpO2 98%   BMI 25.09 kg/m  Wt Readings from Last 3 Encounters:  04/27/19 165 lb (74.8 kg)  01/04/19 168 lb 6.4 oz (76.4 kg)  10/26/18 175 lb 12.8 oz (79.7 kg)   General Appearance: Well nourished, in no apparent distress. Eyes: PERRLA, EOMs, conjunctiva no swelling or erythema Sinuses: No Frontal/maxillary tenderness ENT/Mouth: Ext aud canals clear, TMs without erythema, bulging. No erythema, swelling, or exudate on post pharynx.  Tonsils not swollen or erythematous. Hearing normal.  Neck: Supple, thyroid normal.  Respiratory: Respiratory effort normal, BS equal bilaterally without rales, rhonchi, wheezing or stridor.  Cardio: RRR with no MRGs. Brisk peripheral pulses without edema.  Abdomen: Soft, + BS.  Non tender, no guarding, rebound, hernias, masses. Lymphatics: Non tender without lymphadenopathy.  Musculoskeletal: Full ROM, 5/5 strength, Normal gait Skin: Warm, dry without rashes, lesions, ecchymosis.  Neuro: Cranial  nerves intact. No cerebellar symptoms.  Psych: Awake and oriented X 3, normal affect, Insight and Judgment appropriate.    Quentin MullingAmanda Collier, PA-C 9:36 AM [facility]

## 2019-04-25 IMAGING — CR DG CHEST 2V
2 series · 2 of 2 positions shown · non-contrast
Comparison: Prior chest x-ray 06/04/2015

CLINICAL DATA: 52-year-old male with hypertension

EXAM:
CHEST - 2 VIEW

[w chest pa]
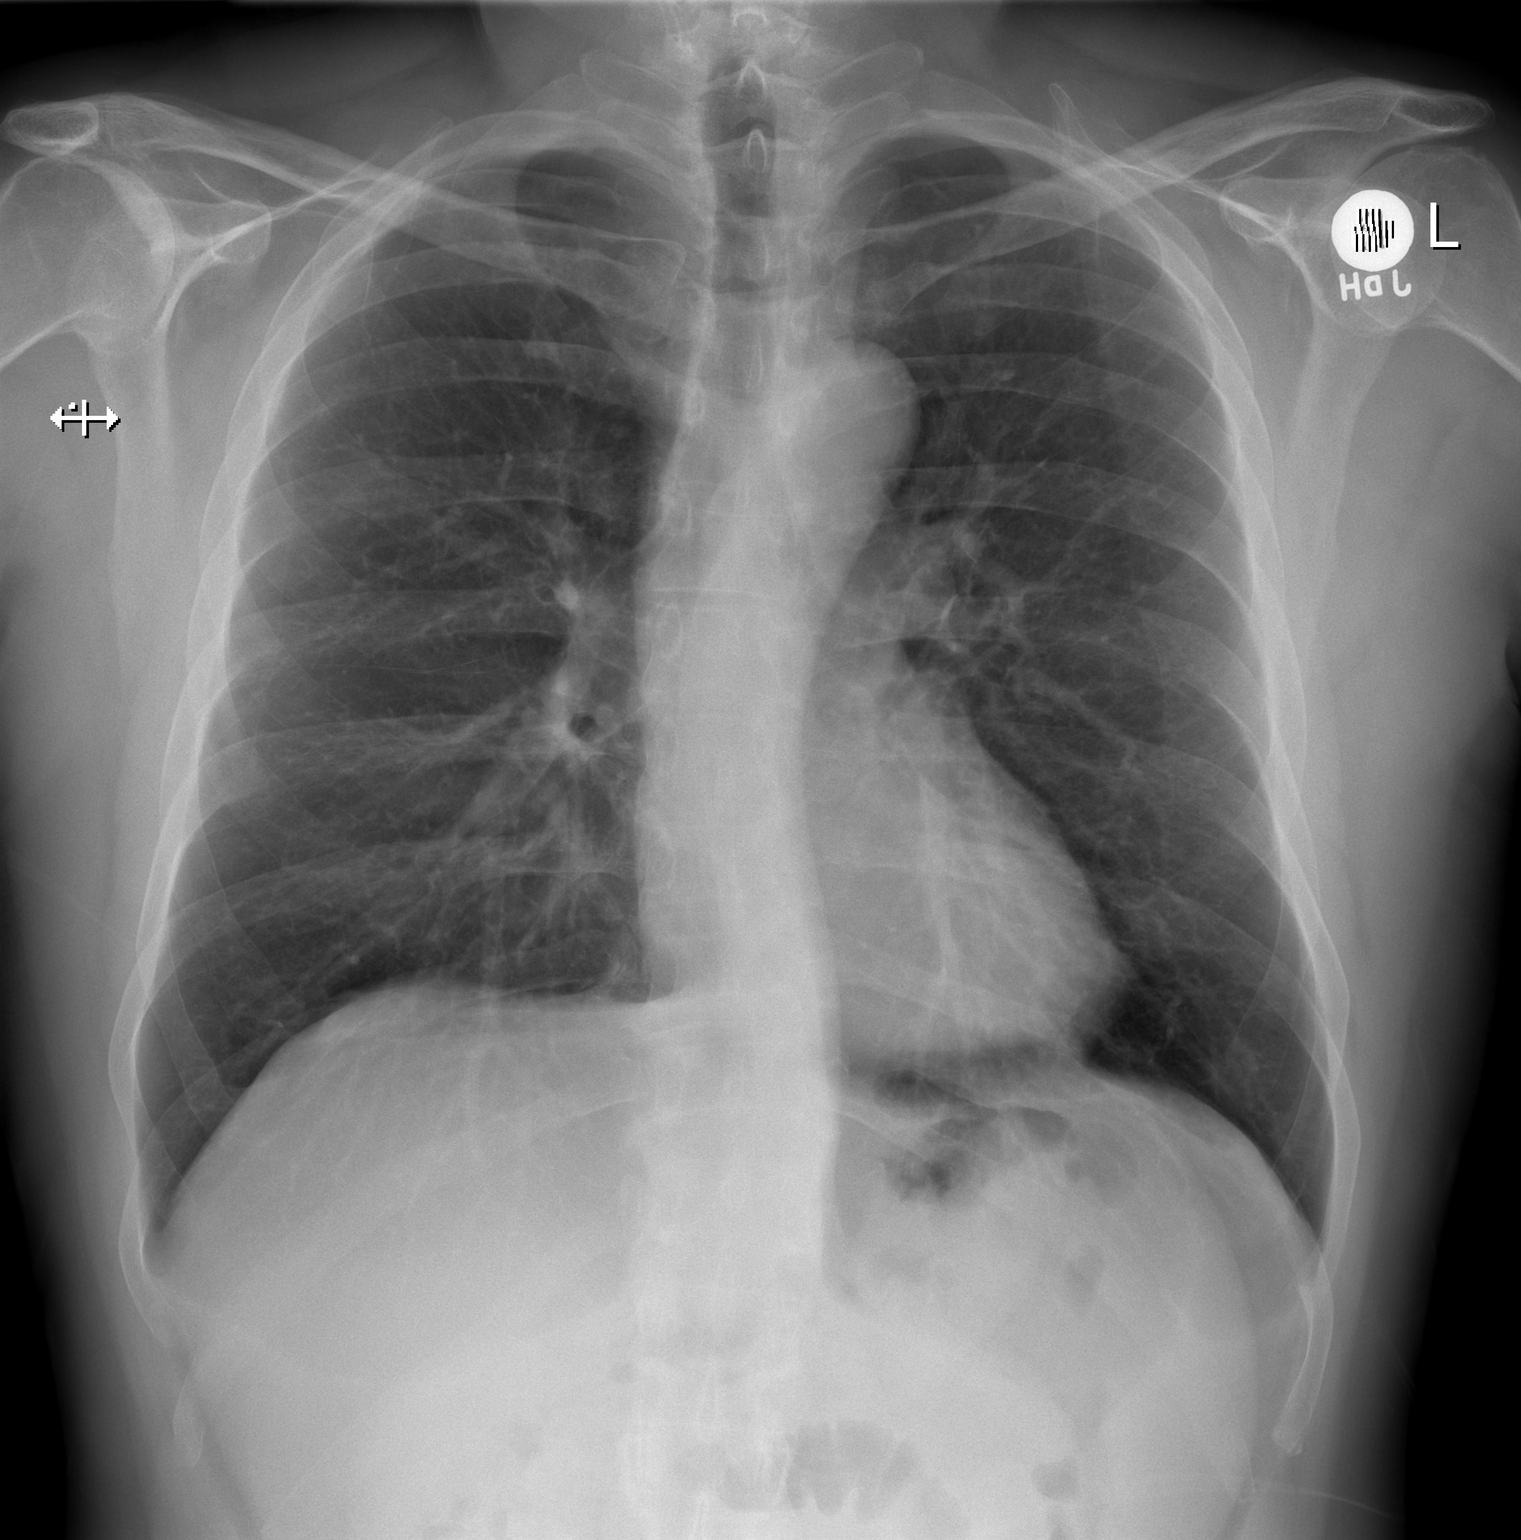

[w chest lat]
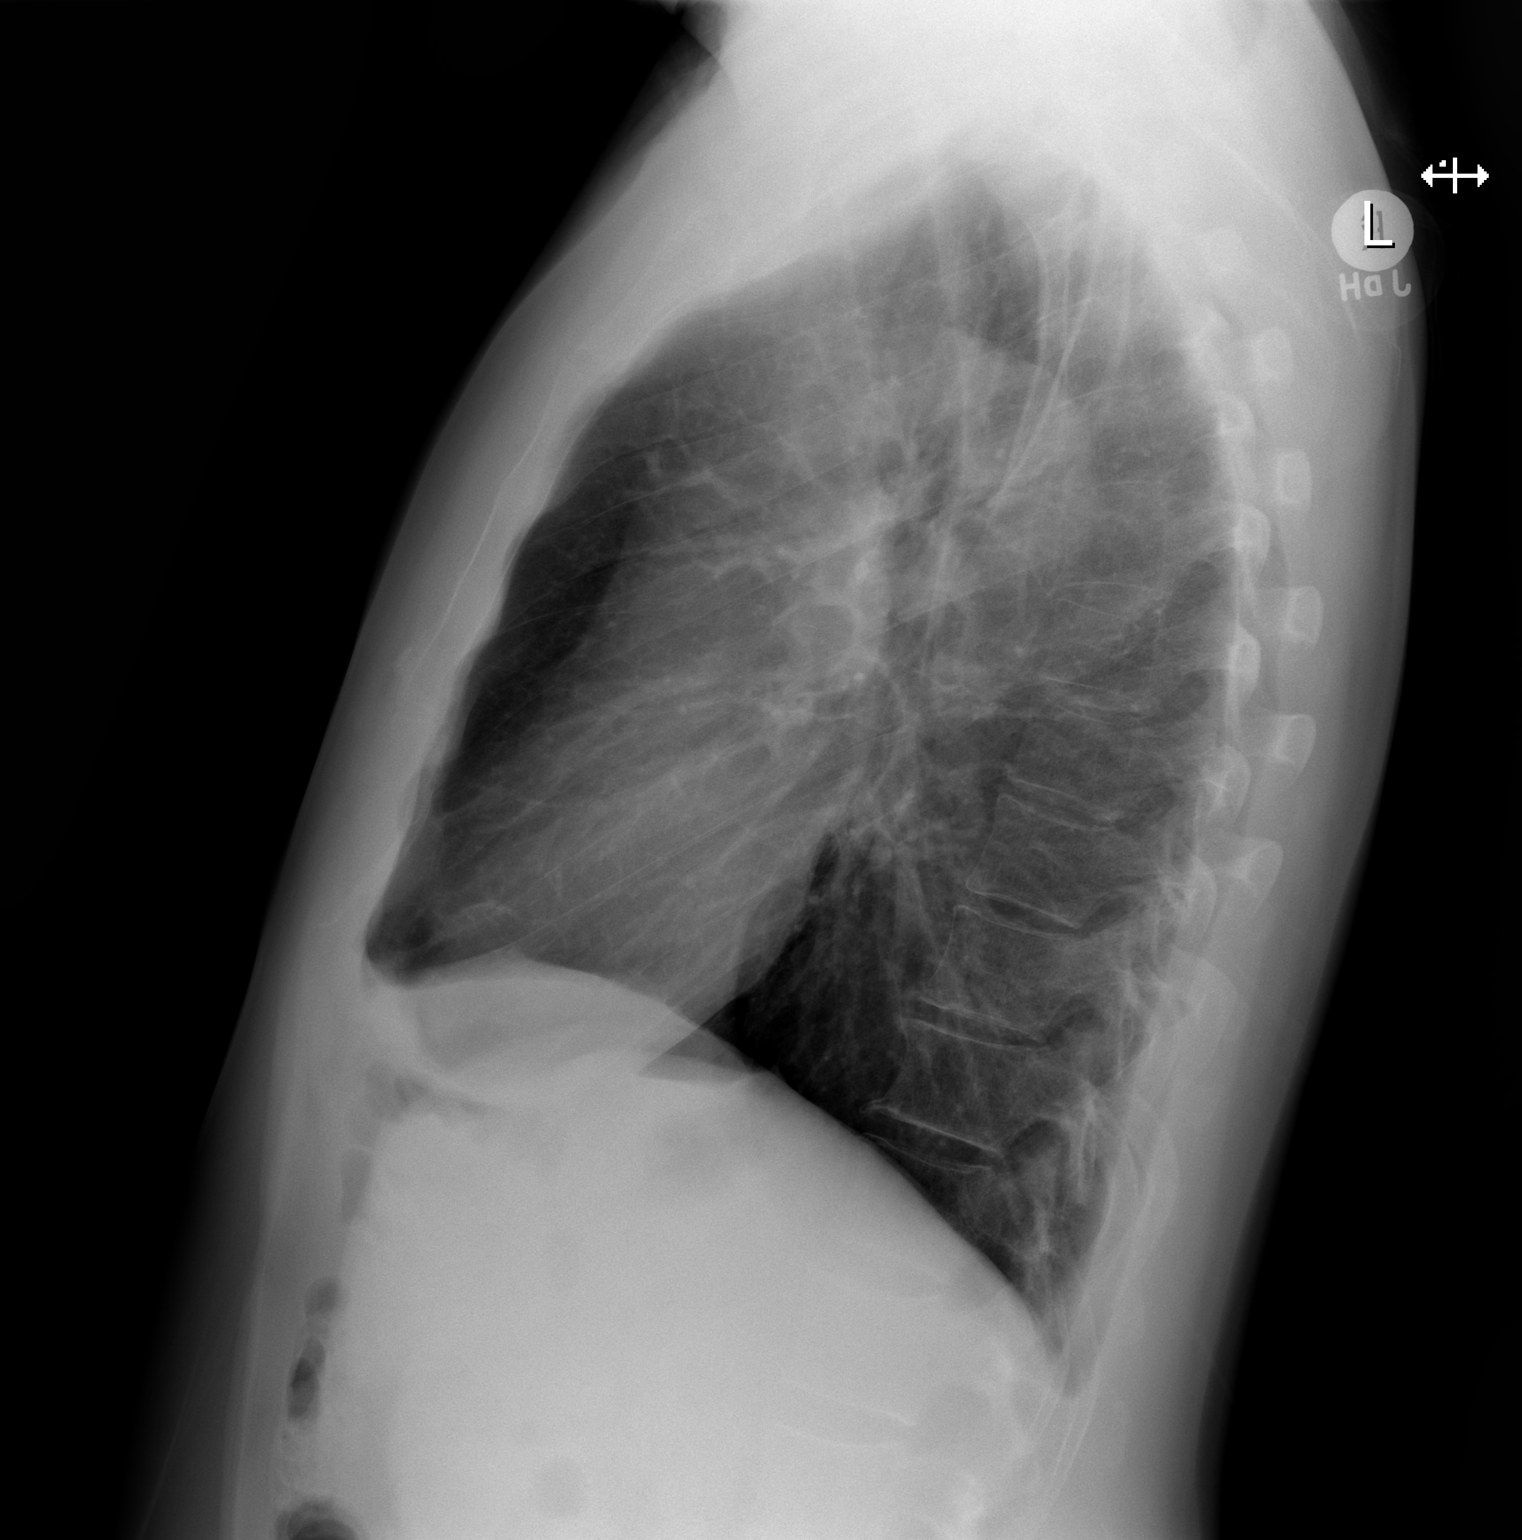

[2 of 2 positions shown; findings below may reference images not displayed]

FINDINGS: Cardiac and mediastinal contours remain unchanged. The descending
aorta is mildly tortuous. No evidence of cardiomegaly. The lungs
remain clear. No evidence of pleural effusion or pneumothorax. No
suspicious pulmonary nodule or mass. Osseous structures are intact
and unremarkable.
IMPRESSION: Stable chest x-ray without evidence of acute cardiopulmonary
process.

## 2019-04-27 ENCOUNTER — Ambulatory Visit: Payer: Self-pay | Admitting: Adult Health

## 2019-04-27 ENCOUNTER — Ambulatory Visit: Payer: BC Managed Care – PPO | Admitting: Physician Assistant

## 2019-04-27 ENCOUNTER — Encounter: Payer: Self-pay | Admitting: Physician Assistant

## 2019-04-27 ENCOUNTER — Other Ambulatory Visit: Payer: Self-pay

## 2019-04-27 VITALS — BP 124/88 | HR 101 | Temp 98.1°F | Ht 68.0 in | Wt 165.0 lb

## 2019-04-27 DIAGNOSIS — R7309 Other abnormal glucose: Secondary | ICD-10-CM

## 2019-04-27 DIAGNOSIS — E559 Vitamin D deficiency, unspecified: Secondary | ICD-10-CM | POA: Diagnosis not present

## 2019-04-27 DIAGNOSIS — Z79899 Other long term (current) drug therapy: Secondary | ICD-10-CM

## 2019-04-27 DIAGNOSIS — D649 Anemia, unspecified: Secondary | ICD-10-CM

## 2019-04-27 DIAGNOSIS — Z114 Encounter for screening for human immunodeficiency virus [HIV]: Secondary | ICD-10-CM

## 2019-04-27 DIAGNOSIS — I10 Essential (primary) hypertension: Secondary | ICD-10-CM

## 2019-04-27 DIAGNOSIS — E782 Mixed hyperlipidemia: Secondary | ICD-10-CM | POA: Diagnosis not present

## 2019-04-27 NOTE — Patient Instructions (Signed)
Do the carpal tunnel brace from a pharmacy at night for 4-6 weeks, if this does not help we can refer to ortho.  Carpal Tunnel Syndrome  Carpal tunnel syndrome is a condition that causes pain in your hand and arm. The carpal tunnel is a narrow area located on the palm side of your wrist. Repeated wrist motion or certain diseases may cause swelling within the tunnel. This swelling pinches the main nerve in the wrist (median nerve). What are the causes? This condition may be caused by:  Repeated wrist motions.  Wrist injuries.  Arthritis.  A cyst or tumor in the carpal tunnel.  Fluid buildup during pregnancy.  Sometimes the cause of this condition is not known. What increases the risk? This condition is more likely to develop in:  People who have jobs that cause them to repeatedly move their wrists in the same motion, such as Art gallery manager.  Women.  People with certain conditions, such as: ? Diabetes. ? Obesity. ? An underactive thyroid (hypothyroidism). ? Kidney failure.  What are the signs or symptoms? Symptoms of this condition include:  A tingling feeling in your fingers, especially in your thumb, index, and middle fingers.  Tingling or numbness in your hand.  An aching feeling in your entire arm, especially when your wrist and elbow are bent for long periods of time.  Wrist pain that goes up your arm to your shoulder.  Pain that goes down into your palm or fingers.  A weak feeling in your hands. You may have trouble grabbing and holding items.  Your symptoms may feel worse during the night. How is this diagnosed? This condition is diagnosed with a medical history and physical exam. You may also have tests, including:  An electromyogram (EMG). This test measures electrical signals sent by your nerves into the muscles.  X-rays.  How is this treated? Treatment for this condition includes:  Lifestyle changes. It is important to stop doing or modify  the activity that caused your condition.  Physical or occupational therapy.  Medicines for pain and inflammation. This may include medicine that is injected into your wrist.  A wrist splint.  Surgery.  Follow these instructions at home: If you have a splint:  Wear it as told by your health care provider. Remove it only as told by your health care provider.  Loosen the splint if your fingers become numb and tingle, or if they turn cold and blue.  Keep the splint clean and dry. General instructions  Take over-the-counter and prescription medicines only as told by your health care provider.  Rest your wrist from any activity that may be causing your pain. If your condition is work related, talk to your employer about changes that can be made, such as getting a wrist pad to use while typing.  If directed, apply ice to the painful area: ? Put ice in a plastic bag. ? Place a towel between your skin and the bag. ? Leave the ice on for 20 minutes, 2-3 times per day.  Keep all follow-up visits as told by your health care provider. This is important.  Do any exercises as told by your health care provider, physical therapist, or occupational therapist. Contact a health care provider if:  You have new symptoms.  Your pain is not controlled with medicines.  Your symptoms get worse. This information is not intended to replace advice given to you by your health care provider. Make sure you discuss any questions you have  with your health care provider. Document Released: 08/15/2000 Document Revised: 12/27/2015 Document Reviewed: 01/03/2015 Elsevier Interactive Patient Education  2018 [place].   Shoulder Impingement Syndrome  Shoulder impingement syndrome is a condition that causes pain when connective tissues (tendons) surrounding the shoulder joint become pinched. These tendons are part of the group of muscles and tissues that help to stabilize the shoulder (rotator cuff).  Beneath the rotator cuff is a fluid-filled sac (bursa) that allows the muscles and tendons to glide smoothly. The bursa may become swollen or irritated (bursitis). Bursitis, swelling in the rotator cuff tendons, or both conditions can decrease how much space is under a bone in the shoulder joint (acromion), resulting in impingement. What are the causes? Shoulder impingement syndrome may be caused by bursitis or swelling of the rotator cuff tendons, which may result from:  Repetitive overhead arm movements.  Falling onto the shoulder.  Weakness in the shoulder muscles. What increases the risk? You may be more likely to develop this condition if you:  Play sports that involve throwing, such as baseball.  Participate in sports such as tennis, volleyball, and swimming.  Work as a Curator, Games developer, or Architect. Some people are also more likely to develop impingement syndrome because of the shape of their acromion bone. What are the signs or symptoms? The main symptom of this condition is pain on the front or side of the shoulder. The pain may:  Get worse when lifting or raising the arm.  Get worse at night.  Wake you up from sleeping.  Feel sharp when the shoulder is moved and then fade to an ache. Other symptoms may include:  Tenderness.  Stiffness.  Inability to raise the arm above shoulder level or behind the body.  Weakness. How is this diagnosed? This condition may be diagnosed based on:  Your symptoms and medical history.  A physical exam.  Imaging tests, such as: ? X-rays. ? MRI. ? Ultrasound. How is this treated? This condition may be treated by:  Resting your shoulder and avoiding all activities that cause pain or put stress on the shoulder.  Icing your shoulder.  NSAIDs to help reduce pain and swelling.  One or more injections of medicines to numb the area and reduce inflammation.  Physical therapy.  Surgery. This may be needed if nonsurgical  treatments have not helped. Surgery may involve repairing the rotator cuff, reshaping the acromion, or removing the bursa. Follow these instructions at home: Managing pain, stiffness, and swelling   If directed, put ice on the injured area. ? Put ice in a plastic bag. ? Place a towel between your skin and the bag. ? Leave the ice on for 20 minutes, 2-3 times a day. Activity  Rest and return to your normal activities as told by your health care provider. Ask your health care provider what activities are safe for you.  Do exercises as told by your health care provider. General instructions  Do not use any products that contain nicotine or tobacco, such as cigarettes, e-cigarettes, and chewing tobacco. These can delay healing. If you need help quitting, ask your health care provider.  Ask your health care provider when it is safe for you to drive.  Take over-the-counter and prescription medicines only as told by your health care provider.  Keep all follow-up visits as told by your health care provider. This is important. How is this prevented?  Give your body time to rest between periods of activity.  Be safe and responsible  while being active. This will help you avoid falls.  Maintain physical fitness, including strength and flexibility. Contact a health care provider if:  Your symptoms have not improved after 1-2 months of treatment and rest.  You cannot lift your arm away from your body. Summary  Shoulder impingement syndrome is a condition that causes pain when connective tissues (tendons) surrounding the shoulder joint become pinched.  The main symptom of this condition is pain on the front or side of the shoulder.  This condition is usually treated with rest, ice, and pain medicines as needed. This information is not intended to replace advice given to you by your health care provider. Make sure you discuss any questions you have with your health care provider. Document  Released: 08/18/2005 Document Revised: 12/10/2018 Document Reviewed: 02/10/2018 Elsevier Patient Education  2020 [employer]

## 2019-04-28 ENCOUNTER — Telehealth: Payer: Self-pay | Admitting: Internal Medicine

## 2019-04-28 ENCOUNTER — Other Ambulatory Visit: Payer: Self-pay | Admitting: Physician Assistant

## 2019-04-28 LAB — TSH: TSH: 0.79 mIU/L (ref 0.40–4.50)

## 2019-04-28 LAB — COMPLETE METABOLIC PANEL WITH GFR
AG Ratio: 1.8 (calc) (ref 1.0–2.5)
ALT: 13 U/L (ref 9–46)
AST: 17 U/L (ref 10–35)
Albumin: 4.2 g/dL (ref 3.6–5.1)
Alkaline phosphatase (APISO): 78 U/L (ref 35–144)
BUN: 12 mg/dL (ref 7–25)
CO2: 26 mmol/L (ref 20–32)
Calcium: 9.2 mg/dL (ref 8.6–10.3)
Chloride: 104 mmol/L (ref 98–110)
Creat: 1.15 mg/dL (ref 0.70–1.33)
GFR, Est African American: 84 mL/min/{1.73_m2} (ref >=60)
GFR, Est Non African American: 72 mL/min/{1.73_m2} (ref >=60)
Globulin: 2.4 g/dL (calc) (ref 1.9–3.7)
Glucose, Bld: 92 mg/dL (ref 65–99)
Potassium: 4.2 mmol/L (ref 3.5–5.3)
Sodium: 139 mmol/L (ref 135–146)
Total Bilirubin: 0.4 mg/dL (ref 0.2–1.2)
Total Protein: 6.6 g/dL (ref 6.1–8.1)

## 2019-04-28 LAB — CBC WITH DIFFERENTIAL/PLATELET
Absolute Monocytes: 562 cells/uL (ref 200–950)
Basophils Absolute: 94 cells/uL (ref 0–200)
Basophils Relative: 0.8 %
Eosinophils Absolute: 503 cells/uL — ABNORMAL HIGH (ref 15–500)
Eosinophils Relative: 4.3 %
HCT: 48.2 % (ref 38.5–50.0)
Hemoglobin: 16.3 g/dL (ref 13.2–17.1)
Lymphs Abs: 2036 cells/uL (ref 850–3900)
MCH: 28.9 pg (ref 27.0–33.0)
MCHC: 33.8 g/dL (ref 32.0–36.0)
MCV: 85.5 fL (ref 80.0–100.0)
MPV: 10.9 fL (ref 7.5–12.5)
Monocytes Relative: 4.8 %
Neutro Abs: 8506 cells/uL — ABNORMAL HIGH (ref 1500–7800)
Neutrophils Relative %: 72.7 %
Platelets: 206 10*3/uL (ref 140–400)
RBC: 5.64 10*6/uL (ref 4.20–5.80)
RDW: 13.1 % (ref 11.0–15.0)
Total Lymphocyte: 17.4 %
WBC: 11.7 10*3/uL — ABNORMAL HIGH (ref 3.8–10.8)

## 2019-04-28 LAB — MAGNESIUM: Magnesium: 2.1 mg/dL (ref 1.5–2.5)

## 2019-04-28 LAB — LIPID PANEL
Cholesterol: 279 mg/dL — ABNORMAL HIGH (ref <200)
HDL: 39 mg/dL — ABNORMAL LOW (ref >=40)
LDL Cholesterol (Calc): 208 mg/dL (calc) — ABNORMAL HIGH
Non-HDL Cholesterol (Calc): 240 mg/dL (calc) — ABNORMAL HIGH (ref <130)
Total CHOL/HDL Ratio: 7.2 (calc) — ABNORMAL HIGH (ref <5.0)
Triglycerides: 157 mg/dL — ABNORMAL HIGH (ref <150)

## 2019-04-28 LAB — IRON, TOTAL/TOTAL IRON BINDING CAP
%SAT: 15 % (calc) — ABNORMAL LOW (ref 20–48)
Iron: 50 ug/dL (ref 50–180)
TIBC: 331 mcg/dL (calc) (ref 250–425)

## 2019-04-28 LAB — HIV ANTIBODY (ROUTINE TESTING W REFLEX): HIV 1&2 Ab, 4th Generation: NONREACTIVE

## 2019-04-28 LAB — VITAMIN B12: Vitamin B-12: 757 pg/mL (ref 200–1100)

## 2019-04-28 LAB — VITAMIN D 25 HYDROXY (VIT D DEFICIENCY, FRACTURES): Vit D, 25-Hydroxy: 44 ng/mL (ref 30–100)

## 2019-04-28 MED ORDER — LOSARTAN POTASSIUM 100 MG PO TABS: ORAL_TABLET | ORAL | 0 refills | Status: DC

## 2019-04-28 MED ORDER — NEXLIZET 180-10 MG PO TABS: 1.0000 | ORAL_TABLET | Freq: Every day | ORAL | 3 refills | Status: DC

## 2019-04-28 NOTE — Telephone Encounter (Signed)
questions about yesterdays appointment. requests call back

## 2019-04-28 NOTE — Addendum Note (Signed)
Addended by: Vicie Mutters R on: 04/28/2019 01:42 PM   Modules accepted: Orders

## 2019-07-20 ENCOUNTER — Other Ambulatory Visit: Payer: Self-pay | Admitting: Internal Medicine

## 2019-08-19 ENCOUNTER — Other Ambulatory Visit: Payer: Self-pay | Admitting: Physician Assistant

## 2019-10-25 ENCOUNTER — Other Ambulatory Visit: Payer: Self-pay | Admitting: Internal Medicine

## 2019-11-15 ENCOUNTER — Encounter: Payer: Self-pay | Admitting: Internal Medicine

## 2019-11-16 ENCOUNTER — Encounter: Payer: Self-pay | Admitting: Internal Medicine

## 2019-11-23 ENCOUNTER — Encounter: Payer: Self-pay | Admitting: Internal Medicine

## 2020-01-09 ENCOUNTER — Encounter: Payer: Self-pay | Admitting: Internal Medicine

## 2020-01-13 ENCOUNTER — Encounter: Payer: Self-pay | Admitting: Internal Medicine

## 2020-01-23 ENCOUNTER — Other Ambulatory Visit: Payer: Self-pay | Admitting: Internal Medicine

## 2020-02-17 ENCOUNTER — Other Ambulatory Visit: Payer: Self-pay | Admitting: Internal Medicine

## 2020-03-27 ENCOUNTER — Encounter: Payer: Self-pay | Admitting: Internal Medicine

## 2020-03-27 NOTE — Patient Instructions (Signed)

## 2020-03-27 NOTE — Progress Notes (Signed)
Annual  Screening/Preventative Visit  & Comprehensive Evaluation & Examination     This very nice 54 y.o.  MWM presents for a Screening /Preventative Visit & comprehensive evaluation and management of multiple medical co-morbidities.  Patient has been followed for HTN, HLD, Prediabetes and Vitamin D Deficiency.     HTN predates since 2010. Patient's BP has been controlled at home.  Today's BP is at goal - 132/84. Patient denies any cardiac symptoms as chest pain, palpitations, shortness of breath, dizziness or ankle swelling.     Patient's hyperlipidemia is not controlled with diet and patient has refused Statins, but in August 2020 , he did agree to take Nexlizet 180/10 - apparently, he alleges he never received notification to pick up the Rx from the [facility].   Last lipids before Nexlizet were not at goal:  Lab Results  Component Value Date   CHOL 279 (H) 04/27/2019   HDL 39 (L) 04/27/2019   LDLCALC 208 (H) 04/27/2019   TRIG 157 (H) 04/27/2019   CHOLHDL 7.2 (H) 04/27/2019       Patient has hx/o prediabetes (A1c 5.7%  / 2012)  and patient denies reactive hypoglycemic symptoms, visual blurring, diabetic polys or paresthesias. Last A1c was Normal & at goal:  Lab Results  Component Value Date   HGBA1C 5.1 10/26/2018        Finally, patient has history of Vitamin D Deficiency ("35" / 2010)  and last vitamin D was not at goal:   Lab Results  Component Value Date   VD25OH 44 04/27/2019    Current Outpatient Medications on File Prior to Visit  Medication Sig  . ALPRAZolam  1 MG tablet Take 1/2 - 1 tablet 2 - 3 x /day ONLY if needed   . Aspirin-Acetaminophen-Caffeine (GOODY HEADACHE) Take 1 Package  2  times daily as needed (for pain.  Marland Kitchen B-12 tabs Take 1 tablet daily.  . diclofenac sodium  1 % GEL Apply 4 g topically 4 times daily  . losartan 100 MG tablet TAKE 1/2 TO 1 TABLET ONCE DAILY FOR HIGH BLOOD PRESSURE.  . sildenafil 100 MG tablet Take 1/2 to 1 tablet Daily as needed  for XXXX  . vitamin C  500 MG tablet Take 500 mg by mouth every morning.  Marland Kitchen VITAMIN D Take 1 capsule sporadically  . zinc  50 MG tab Take 50 mg by mouth daily.  . Bempedoic Acid-Ezetimibe (NEXLIZET) 180-10 MG TABS Take 1 tablet by mouth daily. (Patient not taking: Reported on 03/28/2020)    Allergies  Allergen Reactions  . Lisinopril Cough  . Olmesartan Other (See Comments)    Weakness, joint pain  . Vicodin [Hydrocodone-Acetaminophen] Other (See Comments)    "makes my skin crawl"   Past Medical History:  Diagnosis Date  . Hyperlipidemia   . Hypertension   . Hypogonadism male   . Vitamin D deficiency    Health Maintenance  Topic Date Due  . Hepatitis C Screening  Never done  . COVID-19 Vaccine (1) Never done  . COLONOSCOPY  Never done  . INFLUENZA VACCINE  04/01/2020  . TETANUS/TDAP  07/31/2026  . HIV Screening  Completed   Immunization History  Administered Date(s) Administered  . DTaP 09/02/2007  . PPD Test 06/19/2014, 06/28/2015, 07/31/2016   Last Colon -  Never & also declines to do Cologard  Past Surgical History:  Procedure Laterality Date  . FINGER SURGERY    . ROTATOR CUFF REPAIR     Family History  Problem Relation Age of Onset  . Heart attack Mother   . Kidney failure Father   . Hypertension Brother   . Hypothyroidism Brother   . Hyperlipidemia Brother    Social History   Socioeconomic History  . Marital status: Married    Spouse name: Not on file  . Number of children: Sherrie  Occupational History  . Games developer  Tobacco Use  . Smoking status: Current Every Day Smoker    Packs/day: 1.00    Types: Cigarettes  . Smokeless tobacco: Never Used  Substance and Sexual Activity  . Alcohol use: Yes    Alcohol/week: 7.0 standard drinks    Types: 7 Standard drinks or equivalent per week    Comment: occasional  . Drug use: No  . Sexual activity: Not on file     ROS Constitutional: Denies fever, chills, weight loss/gain, headaches,  insomnia,  night sweats or change in appetite. Does c/o fatigue. Eyes: Denies redness, blurred vision, diplopia, discharge, itchy or watery eyes.  ENT: Denies discharge, congestion, post nasal drip, epistaxis, sore throat, earache, hearing loss, dental pain, Tinnitus, Vertigo, Sinus pain or snoring.  Cardio: Denies chest pain, palpitations, irregular heartbeat, syncope, dyspnea, diaphoresis, orthopnea, PND, claudication or edema Respiratory: denies cough, dyspnea, DOE, pleurisy, hoarseness, laryngitis or wheezing.  Gastrointestinal: Denies dysphagia, heartburn, reflux, water brash, pain, cramps, nausea, vomiting, bloating, diarrhea, constipation, hematemesis, melena, hematochezia, jaundice or hemorrhoids Genitourinary: Denies dysuria, frequency, urgency, nocturia, hesitancy, discharge, hematuria or flank pain Musculoskeletal: Denies arthralgia, myalgia, stiffness, Jt. Swelling, pain, limp or strain/sprain. Denies Falls. Skin: Denies puritis, rash, hives, warts, acne, eczema or change in skin lesion Neuro: No weakness, tremor, incoordination, spasms, paresthesia or pain Psychiatric: Denies confusion, memory loss or sensory loss. Denies Depression. Endocrine: Denies change in weight, skin, hair change, nocturia, and paresthesia, diabetic polys, visual blurring or hyper / hypo glycemic episodes.  Heme/Lymph: No excessive bleeding, bruising or enlarged lymph nodes.  Physical Exam  BP (!) 132/84   Pulse 80   Temp (!) 97 F (36.1 C)   Resp 16   Ht 5\' 8"  (1.727 m)   Wt 167 lb (75.8 kg)   BMI 25.39 kg/m   General Appearance: Well nourished and well groomed and in no apparent distress.  Eyes: PERRLA, EOMs, conjunctiva no swelling or erythema, normal fundi and vessels. Sinuses: No frontal/maxillary tenderness ENT/Mouth: EACs patent / TMs  nl. Nares clear without erythema, swelling, mucoid exudates. Oral hygiene is good. No erythema, swelling, or exudate. Tongue normal, non-obstructing. Tonsils  not swollen or erythematous. Hearing normal.  Neck: Supple, thyroid not palpable. No bruits, nodes or JVD. Respiratory: Respiratory effort normal.  BS equal and clear bilateral without rales, rhonci, wheezing or stridor. Cardio: Heart sounds are normal with regular rate and rhythm and no murmurs, rubs or gallops. Peripheral pulses are normal and equal bilaterally without edema. No aortic or femoral bruits. Chest: symmetric with normal excursions and percussion.  Abdomen: Soft, with Nl bowel sounds. Nontender, no guarding, rebound, hernias, masses, or organomegaly.  Lymphatics: Non tender without lymphadenopathy.  Musculoskeletal: Full ROM all peripheral extremities, joint stability, 5/5 strength, and normal gait. Skin: Warm and dry without rashes, lesions, cyanosis, clubbing or  ecchymosis.  Neuro: Cranial nerves intact, reflexes equal bilaterally. Normal muscle tone, no cerebellar symptoms. Sensation intact.  Pysch: Alert and oriented X 3 with normal affect, insight and judgment appropriate.   Assessment and Plan  1. Annual Preventative/Screening Exam   2. Essential hypertension  - EKG 12-Lead - , RETROPERITNL  ABD,  LTD - Urinalysis, Routine w reflex microscopic - Microalbumin / creatinine urine ratio - CBC with Differential/Platelet - COMPLETE METABOLIC PANEL WITH GFR - Magnesium - TSH  3. Hyperlipidemia, mixed  - EKG 12-Lead - [place], RETROPERITNL ABD,  LTD - Lipid panel - TSH  4. Abnormal glucose  - EKG 12-Lead - [place], RETROPERITNL ABD,  LTD - Hemoglobin A1c - Insulin, random  5. Vitamin D deficiency  - VITAMIN D 25 Hydroxy   6. Screening for colorectal cancer  - POC Hemoccult Bld/Stl   7. Screening examination for pulmonary tuberculosis  - TB Skin Test  8. Prostate cancer screening  - PSA  9. Screening for ischemic heart disease  - EKG 12-Lead  10. FHx: heart disease  - EKG 12-Lead - [place], RETROPERITNL ABD,  LTD  11. Smoker  - EKG 12-Lead - [place],  RETROPERITNL ABD,  LTD  12. Screening for AAA (aortic abdominal aneurysm)  - [place], RETROPERITNL ABD,  LTD  13. Fatigue, unspecified type  - Iron,Total/Total Iron Binding Cap - Vitamin B12 - Testosterone - CBC with Differential/Platelet - TSH  14. Medication management  - Urinalysis, Routine w reflex microscopic - Microalbumin / creatinine urine ratio - CBC with Differential/Platelet - COMPLETE METABOLIC PANEL WITH GFR - Magnesium - Lipid panel - TSH - Hemoglobin A1c - Insulin, random - VITAMIN D 25 Hydroxy        Patient was counseled in prudent diet, weight control to achieve/maintain BMI less than 25, BP monitoring, regular exercise and medications as discussed.  Discussed med effects and SE's. Routine screening labs and tests as requested with regular follow-up as recommended. Over 40 minutes of exam, counseling, chart review and high complex critical decision making was performed   Marinus Maw, MD

## 2020-03-28 ENCOUNTER — Other Ambulatory Visit: Payer: Self-pay

## 2020-03-28 ENCOUNTER — Ambulatory Visit: Payer: BC Managed Care – PPO | Admitting: Internal Medicine

## 2020-03-28 VITALS — BP 132/84 | HR 80 | Temp 97.0°F | Resp 16 | Ht 68.0 in | Wt 167.0 lb

## 2020-03-28 DIAGNOSIS — Z1211 Encounter for screening for malignant neoplasm of colon: Secondary | ICD-10-CM

## 2020-03-28 DIAGNOSIS — Z136 Encounter for screening for cardiovascular disorders: Secondary | ICD-10-CM | POA: Diagnosis not present

## 2020-03-28 DIAGNOSIS — Z13220 Encounter for screening for lipoid disorders: Secondary | ICD-10-CM | POA: Diagnosis not present

## 2020-03-28 DIAGNOSIS — Z131 Encounter for screening for diabetes mellitus: Secondary | ICD-10-CM | POA: Diagnosis not present

## 2020-03-28 DIAGNOSIS — Z125 Encounter for screening for malignant neoplasm of prostate: Secondary | ICD-10-CM | POA: Diagnosis not present

## 2020-03-28 DIAGNOSIS — Z8249 Family history of ischemic heart disease and other diseases of the circulatory system: Secondary | ICD-10-CM

## 2020-03-28 DIAGNOSIS — Z111 Encounter for screening for respiratory tuberculosis: Secondary | ICD-10-CM | POA: Diagnosis not present

## 2020-03-28 DIAGNOSIS — R5383 Other fatigue: Secondary | ICD-10-CM

## 2020-03-28 DIAGNOSIS — Z0000 Encounter for general adult medical examination without abnormal findings: Secondary | ICD-10-CM

## 2020-03-28 DIAGNOSIS — N401 Enlarged prostate with lower urinary tract symptoms: Secondary | ICD-10-CM | POA: Diagnosis not present

## 2020-03-28 DIAGNOSIS — F17200 Nicotine dependence, unspecified, uncomplicated: Secondary | ICD-10-CM

## 2020-03-28 DIAGNOSIS — E782 Mixed hyperlipidemia: Secondary | ICD-10-CM

## 2020-03-28 DIAGNOSIS — Z130 Encounter for screening for diseases of the blood and blood-forming organs and certain disorders involving the immune mechanism: Secondary | ICD-10-CM | POA: Diagnosis not present

## 2020-03-28 DIAGNOSIS — Z1389 Encounter for screening for other disorder: Secondary | ICD-10-CM

## 2020-03-28 DIAGNOSIS — R350 Frequency of micturition: Secondary | ICD-10-CM | POA: Diagnosis not present

## 2020-03-28 DIAGNOSIS — Z79899 Other long term (current) drug therapy: Secondary | ICD-10-CM | POA: Diagnosis not present

## 2020-03-28 DIAGNOSIS — Z1329 Encounter for screening for other suspected endocrine disorder: Secondary | ICD-10-CM

## 2020-03-28 DIAGNOSIS — R7309 Other abnormal glucose: Secondary | ICD-10-CM

## 2020-03-28 DIAGNOSIS — E559 Vitamin D deficiency, unspecified: Secondary | ICD-10-CM

## 2020-03-28 DIAGNOSIS — I10 Essential (primary) hypertension: Secondary | ICD-10-CM | POA: Diagnosis not present

## 2020-03-28 DIAGNOSIS — Z0001 Encounter for general adult medical examination with abnormal findings: Secondary | ICD-10-CM

## 2020-03-29 ENCOUNTER — Other Ambulatory Visit: Payer: Self-pay | Admitting: Internal Medicine

## 2020-03-29 DIAGNOSIS — E782 Mixed hyperlipidemia: Secondary | ICD-10-CM

## 2020-03-29 LAB — MICROALBUMIN / CREATININE URINE RATIO
Creatinine, Urine: 30 mg/dL (ref 20–320)
Microalb Creat Ratio: 7 mcg/mg creat (ref <30)
Microalb, Ur: 0.2 mg/dL

## 2020-03-29 LAB — CBC WITH DIFFERENTIAL/PLATELET
Absolute Monocytes: 466 cells/uL (ref 200–950)
Basophils Absolute: 53 cells/uL (ref 0–200)
Basophils Relative: 0.6 %
Eosinophils Absolute: 889 cells/uL — ABNORMAL HIGH (ref 15–500)
Eosinophils Relative: 10.1 %
HCT: 44.2 % (ref 38.5–50.0)
Hemoglobin: 15.5 g/dL (ref 13.2–17.1)
Lymphs Abs: 2182 cells/uL (ref 850–3900)
MCH: 29.8 pg (ref 27.0–33.0)
MCHC: 35.1 g/dL (ref 32.0–36.0)
MCV: 85 fL (ref 80.0–100.0)
MPV: 10.4 fL (ref 7.5–12.5)
Monocytes Relative: 5.3 %
Neutro Abs: 5210 cells/uL (ref 1500–7800)
Neutrophils Relative %: 59.2 %
Platelets: 195 10*3/uL (ref 140–400)
RBC: 5.2 10*6/uL (ref 4.20–5.80)
RDW: 13.3 % (ref 11.0–15.0)
Total Lymphocyte: 24.8 %
WBC: 8.8 10*3/uL (ref 3.8–10.8)

## 2020-03-29 LAB — HEMOGLOBIN A1C
Hgb A1c MFr Bld: 5 % of total Hgb (ref <5.7)
Mean Plasma Glucose: 97 (calc)
eAG (mmol/L): 5.4 (calc)

## 2020-03-29 LAB — COMPLETE METABOLIC PANEL WITH GFR
AG Ratio: 1.6 (calc) (ref 1.0–2.5)
ALT: 10 U/L (ref 9–46)
AST: 15 U/L (ref 10–35)
Albumin: 4 g/dL (ref 3.6–5.1)
Alkaline phosphatase (APISO): 68 U/L (ref 35–144)
BUN: 11 mg/dL (ref 7–25)
CO2: 28 mmol/L (ref 20–32)
Calcium: 9 mg/dL (ref 8.6–10.3)
Chloride: 103 mmol/L (ref 98–110)
Creat: 1.17 mg/dL (ref 0.70–1.33)
GFR, Est African American: 81 mL/min/{1.73_m2} (ref >=60)
GFR, Est Non African American: 70 mL/min/{1.73_m2} (ref >=60)
Globulin: 2.5 g/dL (calc) (ref 1.9–3.7)
Glucose, Bld: 96 mg/dL (ref 65–99)
Potassium: 4.3 mmol/L (ref 3.5–5.3)
Sodium: 136 mmol/L (ref 135–146)
Total Bilirubin: 0.5 mg/dL (ref 0.2–1.2)
Total Protein: 6.5 g/dL (ref 6.1–8.1)

## 2020-03-29 LAB — MAGNESIUM: Magnesium: 2.1 mg/dL (ref 1.5–2.5)

## 2020-03-29 LAB — URINALYSIS, ROUTINE W REFLEX MICROSCOPIC
Bilirubin Urine: NEGATIVE
Glucose, UA: NEGATIVE
Hgb urine dipstick: NEGATIVE
Ketones, ur: NEGATIVE
Leukocytes,Ua: NEGATIVE
Nitrite: NEGATIVE
Protein, ur: NEGATIVE
Specific Gravity, Urine: 1.004 (ref 1.001–1.03)
pH: 6.5 (ref 5.0–8.0)

## 2020-03-29 LAB — IRON, TOTAL/TOTAL IRON BINDING CAP: Iron: 90 ug/dL (ref 50–180)

## 2020-03-29 LAB — LIPID PANEL
Cholesterol: 281 mg/dL — ABNORMAL HIGH (ref <200)
HDL: 40 mg/dL (ref >=40)
LDL Cholesterol (Calc): 212 mg/dL (calc) — ABNORMAL HIGH
Non-HDL Cholesterol (Calc): 241 mg/dL (calc) — ABNORMAL HIGH (ref <130)
Total CHOL/HDL Ratio: 7 (calc) — ABNORMAL HIGH (ref <5.0)
Triglycerides: 141 mg/dL (ref <150)

## 2020-03-29 LAB — VITAMIN D 25 HYDROXY (VIT D DEFICIENCY, FRACTURES): Vit D, 25-Hydroxy: 29 ng/mL — ABNORMAL LOW (ref 30–100)

## 2020-03-29 LAB — INSULIN, RANDOM: Insulin: 5.4 u[IU]/mL

## 2020-03-29 LAB — TESTOSTERONE: Testosterone: 720 ng/dL (ref 250–827)

## 2020-03-29 LAB — TSH: TSH: 0.7 mIU/L (ref 0.40–4.50)

## 2020-03-29 LAB — VITAMIN B12: Vitamin B-12: 1152 pg/mL — ABNORMAL HIGH (ref 200–1100)

## 2020-03-29 LAB — PSA: PSA: 0.5 ng/mL (ref <=4.0)

## 2020-03-29 MED ORDER — NEXLIZET 180-10 MG PO TABS: ORAL_TABLET | ORAL | 0 refills | Status: DC

## 2020-03-29 NOTE — Progress Notes (Signed)
===========================================================  -   Iron & Vitamin B12 levels  both Normal & OK  ===========================================================  - PSA - Low - Great  ===========================================================  - Testosterone Level - Normal & OK  ===========================================================  - Chol = 281 - very very high  (Ideal or goal is less than 180)   - and   - Bad or Dangerous LDL Chol = 212 - also very high risk for Heart Attack, Stroke & Vascular Dementia  - Rx for Nexlizet sent in to [facility] ===========================================================  - A1c - Normal - Great - No Diabetes ===========================================================  - Vitamin D = 29 - very very low   -- Vitamin D goal is between 70-100.  $$$$$$$$$$$$$$$$$$$$$$$$$$$$$$$$$$$$$$$$$$$$$$$$$$$$$$$$$$$$  - Please take  Vitamin D 10,000 units /Daily  (ie Vit D 5,000 unit caps x 2 caps = 10,000 units)   $$$$$$$$$$$$$$$$$$$$$$$$$$$$$$$$$$$$$$$$$$$$$$$$$$$$$$$$$$$$ - It is very important as a natural anti-inflammatory and helping the  immune system protect against viral infections, like the Covid-19    helping hair, skin, and nails, as well as reducing stroke and heart attack risk.   - It helps your bones and helps with mood.  - It also decreases numerous cancer risks so please take it as directed.   - Low Vit D is associated with a 200-300% higher risk for CANCER   and 200-300% higher risk for HEART   ATTACK  &  STROKE.    - It is also associated with higher death rate at younger ages,   autoimmune diseases like Rheumatoid arthritis, Lupus, Multiple Sclerosis.     - Also many other serious conditions, like depression, Alzheimer's  Dementia, infertility, muscle aches, fatigue, fibromyalgia - just to name a few.  ==========================================================   - All Else - CBC - Kidneys - Electrolytes -  Liver - Magnesium & Thyroid    - all  Normal / OK ==========================================================

## 2020-04-02 ENCOUNTER — Telehealth: Payer: Self-pay | Admitting: *Deleted

## 2020-04-02 LAB — TB SKIN TEST
Induration: 0 mm
TB Skin Test: NEGATIVE

## 2020-04-02 NOTE — Telephone Encounter (Signed)
Spouse aware of lab results.

## 2020-04-11 ENCOUNTER — Other Ambulatory Visit: Payer: Self-pay | Admitting: *Deleted

## 2020-04-11 MED ORDER — ROSUVASTATIN CALCIUM 5 MG PO TABS: ORAL_TABLET | ORAL | 1 refills | Status: DC

## 2020-05-18 ENCOUNTER — Other Ambulatory Visit: Payer: Self-pay | Admitting: Internal Medicine

## 2020-05-18 DIAGNOSIS — N528 Other male erectile dysfunction: Secondary | ICD-10-CM

## 2020-06-04 ENCOUNTER — Telehealth: Payer: Self-pay | Admitting: *Deleted

## 2020-06-04 NOTE — Telephone Encounter (Signed)
Returned a call to spouse regarding Crestor and a cough. Per Dr Arrie Senate, Crestor should not cause patient to cough and he suggested he try a OTC allergy pill. Spouse is aware.

## 2020-06-28 ENCOUNTER — Ambulatory Visit: Payer: BC Managed Care – PPO | Admitting: Adult Health

## 2020-07-13 ENCOUNTER — Other Ambulatory Visit: Payer: Self-pay | Admitting: Internal Medicine

## 2020-07-17 ENCOUNTER — Encounter: Payer: Self-pay | Admitting: Internal Medicine

## 2020-07-17 ENCOUNTER — Ambulatory Visit: Payer: BC Managed Care – PPO | Admitting: Internal Medicine

## 2020-07-17 ENCOUNTER — Other Ambulatory Visit: Payer: Self-pay

## 2020-07-17 VITALS — BP 142/84 | HR 76 | Temp 97.2°F | Resp 16 | Ht 68.0 in | Wt 170.0 lb

## 2020-07-17 DIAGNOSIS — E559 Vitamin D deficiency, unspecified: Secondary | ICD-10-CM

## 2020-07-17 DIAGNOSIS — E782 Mixed hyperlipidemia: Secondary | ICD-10-CM

## 2020-07-17 DIAGNOSIS — I10 Essential (primary) hypertension: Secondary | ICD-10-CM | POA: Diagnosis not present

## 2020-07-17 DIAGNOSIS — Z79899 Other long term (current) drug therapy: Secondary | ICD-10-CM | POA: Diagnosis not present

## 2020-07-17 DIAGNOSIS — R7309 Other abnormal glucose: Secondary | ICD-10-CM

## 2020-07-17 MED ORDER — BUPROPION HCL ER (XL) 300 MG PO TB24: ORAL_TABLET | ORAL | 0 refills | Status: DC

## 2020-07-17 NOTE — Patient Instructions (Signed)

## 2020-07-17 NOTE — Progress Notes (Signed)
History of Present Illness:       This  54 y.o.   MWM presents for 3 month follow up with HTN, HLD, Pre-Diabetes and Vitamin D Deficiency.       Patient is treated for HTN  (2010) & BP has been controlled at home. Today's BP is at goal  - 142/84. Patient has had no complaints of any cardiac type chest pain, palpitations, dyspnea / orthopnea / PND, dizziness, claudication, or dependent edema.      Hyperlipidemia is not  controlled with  diet and in the past, patient has refused Statins, but recently did concede to take Rosuvaststin.  Patient denies myalgias or other med SE's. Last Lipids were not at goal:  Lab Results  Component Value Date   CHOL 281 (H) 03/28/2020   HDL 40 03/28/2020   LDLCALC 212 (H) 03/28/2020   TRIG 141 03/28/2020   CHOLHDL 7.0 (H) 03/28/2020    Also, the patient has history of PreDiabetes (A1c 5.7%  /2012) and has had no symptoms of reactive hypoglycemia, diabetic polys, paresthesias or visual blurring.  Last A1c was Normal & at goal:  Lab Results  Component Value Date   HGBA1C 5.0 03/28/2020           Further, the patient also has history of Vitamin D Deficiency ("35" /2010) and supplements vitamin D without any suspected side-effects. Last vitamin D was still low:  Lab Results  Component Value Date   VD25OH 55 (L) 03/28/2020    Current Outpatient Medications on File Prior to Visit  Medication Sig  . ALPRAZolam (XANAX) 1 MG tablet Take 1/2 - 1 tablet 2 - 3 x /day ONLY if needed for Anxiety Attack & try  limit to 5 days /week to avoid Addiction & Dementia  . Aspirin-Acetaminophen-Caffeine(GOODY HEADACHE  Take 1 Package  2  times daily as needed  . CINNAMON PO Take 1 capsule  daily.  . B-12 tablet  Take 1 daily.  . diclofenac  1 % GEL Apply 4 g topically 4 times daily  . losartan (COZAAR) 100 MG tablet Take    1 tablet    Daily     for BP  . rosuvastatin (CRESTOR) 5 MG tablet Take 1 tablet 3 days a week for cholesterol.  . sildenafil (VIAGRA) 100  MG tablet Take     1/2 to 1 tablet    Daily   as   needed for XXXX  . TURMERIC PO Take 2 tablets by mouth daily.  . vitamin C  500 MG tablet Take 500 mg by mouth every morning.  Marland Kitchen VITAMIN D 5,000 u Take 1 capsule  day  . zinc gluconate 50 MG tablet Take 50 mg by mouth daily.    Allergies  Allergen Reactions  . Lisinopril Cough  . Olmesartan Other (See Comments)    Weakness, joint pain  . Vicodin [Hydrocodone-Acetaminophen] Other (See Comments)    "makes my skin crawl"    PMHx:   Past Medical History:  Diagnosis Date  . Hyperlipidemia   . Hypertension   . Hypogonadism male   . Vitamin D deficiency     Immunization History  Administered Date(s) Administered  . DTaP 09/02/2007  . PPD Test 06/19/2014, 06/28/2015, 07/31/2016, 03/28/2020    Past Surgical History:  Procedure Laterality Date  . FINGER SURGERY    . ROTATOR CUFF REPAIR      FHx:    Reviewed / unchanged  SHx:  Reviewed / unchanged   Systems Review:  Constitutional: Denies fever, chills, wt changes, headaches, insomnia, fatigue, night sweats, change in appetite. Eyes: Denies redness, blurred vision, diplopia, discharge, itchy, watery eyes.  ENT: Denies discharge, congestion, post nasal drip, epistaxis, sore throat, earache, hearing loss, dental pain, tinnitus, vertigo, sinus pain, snoring.  CV: Denies chest pain, palpitations, irregular heartbeat, syncope, dyspnea, diaphoresis, orthopnea, PND, claudication or edema. Respiratory: denies cough, dyspnea, DOE, pleurisy, hoarseness, laryngitis, wheezing.  Gastrointestinal: Denies dysphagia, odynophagia, heartburn, reflux, water brash, abdominal pain or cramps, nausea, vomiting, bloating, diarrhea, constipation, hematemesis, melena, hematochezia  or hemorrhoids. Genitourinary: Denies dysuria, frequency, urgency, nocturia, hesitancy, discharge, hematuria or flank pain. Musculoskeletal: Denies arthralgias, myalgias, stiffness, jt. swelling, pain, limping or  strain/sprain.  Skin: Denies pruritus, rash, hives, warts, acne, eczema or change in skin lesion(s). Neuro: No weakness, tremor, incoordination, spasms, paresthesia or pain. Psychiatric: Denies confusion, memory loss or sensory loss. Endo: Denies change in weight, skin or hair change.  Heme/Lymph: No excessive bleeding, bruising or enlarged lymph nodes.  Physical Exam  BP (!) 142/84   Pulse 76   Temp (!) 97.2 F (36.2 C)   Resp 16   Ht 5\' 8"  (1.727 m)   Wt 170 lb (77.1 kg)   SpO2 96%   BMI 25.85 kg/m   Appears  well nourished, well groomed  and in no distress.  Eyes: PERRLA, EOMs, conjunctiva no swelling or erythema. Sinuses: No frontal/maxillary tenderness ENT/Mouth: EAC's clear, TM's nl w/o erythema, bulging. Nares clear w/o erythema, swelling, exudates. Oropharynx clear without erythema or exudates. Oral hygiene is good. Tongue normal, non obstructing. Hearing intact.  Neck: Supple. Thyroid not palpable. Car 2+/2+ without bruits, nodes or JVD. Chest: Respirations nl with BS clear & equal w/o rales, rhonchi, wheezing or stridor.  Cor: Heart sounds normal w/ regular rate and rhythm without sig. murmurs, gallops, clicks or rubs. Peripheral pulses normal and equal  without edema.  Abdomen: Soft & bowel sounds normal. Non-tender w/o guarding, rebound, hernias, masses or organomegaly.  Lymphatics: Unremarkable.  Musculoskeletal: Full ROM all peripheral extremities, joint stability, 5/5 strength and normal gait.  Skin: Warm, dry without exposed rashes, lesions or ecchymosis apparent.  Neuro: Cranial nerves intact, reflexes equal bilaterally. Sensory-motor testing grossly intact. Tendon reflexes grossly intact.  Pysch: Alert & oriented x 3.  Insight and judgement nl & appropriate. No ideations.  Assessment and Plan:  1. Essential hypertension  - Continue medication, monitor blood pressure at home.  - Continue DASH diet.  Reminder to go to the ER if any CP,  SOB, nausea, dizziness,  severe HA, changes vision/speech.  - CBC with Differential/Platelet - COMPLETE METABOLIC PANEL WITH GFR - Magnesium - TSH  2. Hyperlipidemia, mixed  - Continue diet/meds, exercise,& lifestyle modifications.  - Continue monitor periodic cholesterol/liver & renal functions   - Lipid panel - TSH  3. Abnormal glucose  - Continue diet, exercise  - Lifestyle modifications.  - Monitor appropriate labs.  - Hemoglobin A1c - Insulin, random  4. Vitamin D deficiency  - Continue supplementation.  - VITAMIN D 25 Hydroxy  5. Medication management  - CBC with Differential/Platelet - COMPLETE METABOLIC PANEL WITH GFR - Magnesium - Lipid panel - TSH - Hemoglobin A1c - Insulin, random - VITAMIN D 25 Hydroxy        Discussed  regular exercise, BP monitoring, weight control to achieve/maintain BMI less than 25 and discussed med and SE's. Recommended labs to assess and monitor clinical status with further disposition pending results  of labs.  I discussed the assessment and treatment plan with the patient. The patient was provided an opportunity to ask questions and all were answered. The patient agreed with the plan and demonstrated an understanding of the instructions.  I provided over 30 minutes of exam, counseling, chart review and  complex critical decision making.   Marinus Maw, MD

## 2020-07-18 LAB — TSH: TSH: 0.85 mIU/L (ref 0.40–4.50)

## 2020-07-18 LAB — MAGNESIUM: Magnesium: 2.2 mg/dL (ref 1.5–2.5)

## 2020-07-18 LAB — COMPLETE METABOLIC PANEL WITH GFR
AG Ratio: 1.8 (calc) (ref 1.0–2.5)
ALT: 14 U/L (ref 9–46)
AST: 16 U/L (ref 10–35)
Albumin: 4.2 g/dL (ref 3.6–5.1)
Alkaline phosphatase (APISO): 70 U/L (ref 35–144)
BUN: 18 mg/dL (ref 7–25)
CO2: 26 mmol/L (ref 20–32)
Calcium: 9.4 mg/dL (ref 8.6–10.3)
Chloride: 106 mmol/L (ref 98–110)
Creat: 1.18 mg/dL (ref 0.70–1.33)
GFR, Est African American: 81 mL/min/{1.73_m2} (ref >=60)
GFR, Est Non African American: 70 mL/min/{1.73_m2} (ref >=60)
Globulin: 2.3 g/dL (calc) (ref 1.9–3.7)
Glucose, Bld: 105 mg/dL — ABNORMAL HIGH (ref 65–99)
Potassium: 4 mmol/L (ref 3.5–5.3)
Sodium: 142 mmol/L (ref 135–146)
Total Bilirubin: 0.4 mg/dL (ref 0.2–1.2)
Total Protein: 6.5 g/dL (ref 6.1–8.1)

## 2020-07-18 LAB — CBC WITH DIFFERENTIAL/PLATELET
Absolute Monocytes: 460 cells/uL (ref 200–950)
Basophils Absolute: 73 cells/uL (ref 0–200)
Basophils Relative: 0.6 %
Eosinophils Absolute: 617 cells/uL — ABNORMAL HIGH (ref 15–500)
Eosinophils Relative: 5.1 %
HCT: 43.5 % (ref 38.5–50.0)
Hemoglobin: 14.9 g/dL (ref 13.2–17.1)
Lymphs Abs: 2190 cells/uL (ref 850–3900)
MCH: 29.3 pg (ref 27.0–33.0)
MCHC: 34.3 g/dL (ref 32.0–36.0)
MCV: 85.5 fL (ref 80.0–100.0)
MPV: 10.3 fL (ref 7.5–12.5)
Monocytes Relative: 3.8 %
Neutro Abs: 8760 cells/uL — ABNORMAL HIGH (ref 1500–7800)
Neutrophils Relative %: 72.4 %
Platelets: 186 10*3/uL (ref 140–400)
RBC: 5.09 10*6/uL (ref 4.20–5.80)
RDW: 12.6 % (ref 11.0–15.0)
Total Lymphocyte: 18.1 %
WBC: 12.1 10*3/uL — ABNORMAL HIGH (ref 3.8–10.8)

## 2020-07-18 LAB — HEMOGLOBIN A1C
Hgb A1c MFr Bld: 5.1 % of total Hgb (ref <5.7)
Mean Plasma Glucose: 100 (calc)
eAG (mmol/L): 5.5 (calc)

## 2020-07-18 LAB — INSULIN, RANDOM: Insulin: 8.5 u[IU]/mL

## 2020-07-18 LAB — LIPID PANEL
Cholesterol: 205 mg/dL — ABNORMAL HIGH (ref <200)
HDL: 41 mg/dL (ref >=40)
LDL Cholesterol (Calc): 138 mg/dL (calc) — ABNORMAL HIGH
Non-HDL Cholesterol (Calc): 164 mg/dL (calc) — ABNORMAL HIGH (ref <130)
Total CHOL/HDL Ratio: 5 (calc) — ABNORMAL HIGH (ref <5.0)
Triglycerides: 131 mg/dL (ref <150)

## 2020-07-18 LAB — VITAMIN D 25 HYDROXY (VIT D DEFICIENCY, FRACTURES): Vit D, 25-Hydroxy: 37 ng/mL (ref 30–100)

## 2020-07-18 NOTE — Progress Notes (Signed)
========================================================== ==========================================================  -    Chol  is much better - down from  306, 279 and 281 to now 205  (Ideal or Goal is less than 180)  - and   - Bad / dangerous LDL Chol down from 224, 208 and 212     to now 164 & very high risk for heart attack, stroke, kidney failure,  gangrene of a leg and Vascular Dementia (or Senility)   (Ideal or goal is less than 70 ! ) ==========================================================  -  A1c-Normal -Great -No Diabetes ==========================================================  -  Vitamin D = 37 - very very low   - Vitamin D goal is between 70-100.   - Please make sure that you are taking your Vitamin D as directed.   - It is very important as a natural anti-inflammatory and helping the  immune system protect against viral infections, like the Covid-19    helping hair, skin, and nails, as well as reducing stroke and  heart attack risk.   - It helps your bones and helps with mood.  - It also decreases numerous cancer risks so please  take it as directed.   - Low Vit D is associated with a 200-300% higher risk for  CANCER   and 200-300% higher risk for HEART   ATTACK  &  STROKE.    - It is also associated with higher death rate at younger ages,   autoimmune diseases like Rheumatoid arthritis, Lupus,  Multiple Sclerosis.     - Also many other serious conditions, like depression, Alzheimer's  Dementia, infertility, muscle aches, fatigue, fibromyalgia   - just to name a few. ========================================================== ==========================================================  -  Recommend take Vitamin D = 5,000 unit capsule EVERY day ! ==========================================================  -  All Else - CBC - Kidneys - Electrolytes - Liver - Magnesium & Thyroid    - all  Normal /  OK ==========================================================

## 2020-07-23 ENCOUNTER — Encounter: Payer: Self-pay | Admitting: *Deleted

## 2020-08-17 ENCOUNTER — Other Ambulatory Visit: Payer: Self-pay | Admitting: Internal Medicine

## 2020-08-17 DIAGNOSIS — I10 Essential (primary) hypertension: Secondary | ICD-10-CM

## 2020-08-17 MED ORDER — LOSARTAN POTASSIUM 100 MG PO TABS: ORAL_TABLET | ORAL | 0 refills | Status: DC

## 2020-10-01 ENCOUNTER — Ambulatory Visit: Payer: BC Managed Care – PPO | Admitting: Internal Medicine

## 2020-10-19 ENCOUNTER — Other Ambulatory Visit: Payer: Self-pay | Admitting: Internal Medicine

## 2020-10-31 ENCOUNTER — Ambulatory Visit: Payer: BC Managed Care – PPO | Admitting: Internal Medicine

## 2020-11-15 ENCOUNTER — Other Ambulatory Visit: Payer: Self-pay | Admitting: Internal Medicine

## 2020-11-15 DIAGNOSIS — I10 Essential (primary) hypertension: Secondary | ICD-10-CM

## 2020-11-29 ENCOUNTER — Encounter: Payer: BC Managed Care – PPO | Admitting: Internal Medicine

## 2021-01-08 ENCOUNTER — Encounter: Payer: Self-pay | Admitting: Internal Medicine

## 2021-01-11 ENCOUNTER — Other Ambulatory Visit: Payer: Self-pay | Admitting: Adult Health Nurse Practitioner

## 2021-03-28 ENCOUNTER — Encounter: Payer: Self-pay | Admitting: Internal Medicine

## 2021-04-18 ENCOUNTER — Other Ambulatory Visit: Payer: Self-pay | Admitting: Internal Medicine

## 2021-05-23 ENCOUNTER — Other Ambulatory Visit: Payer: Self-pay | Admitting: Internal Medicine

## 2021-05-23 DIAGNOSIS — N528 Other male erectile dysfunction: Secondary | ICD-10-CM

## 2021-05-26 NOTE — Progress Notes (Signed)
     Appointment   Aborted  Patient absolutely refused to accommodate & wear  a face mask.    Despite being explained that it is the protocol for all Medical offices in this Building.  Patient was explained that it is out of concern & respect for our employees, their families and likewise for our patients & their families.   Patient was advised that I would be happy to see him if he wears a mask & he declined & left the office.

## 2021-05-27 ENCOUNTER — Ambulatory Visit: Payer: BC Managed Care – PPO | Admitting: Internal Medicine

## 2021-05-27 ENCOUNTER — Other Ambulatory Visit: Payer: Self-pay

## 2021-05-27 DIAGNOSIS — E559 Vitamin D deficiency, unspecified: Secondary | ICD-10-CM

## 2021-05-27 DIAGNOSIS — I10 Essential (primary) hypertension: Secondary | ICD-10-CM

## 2021-05-27 DIAGNOSIS — E782 Mixed hyperlipidemia: Secondary | ICD-10-CM

## 2021-05-27 DIAGNOSIS — R7309 Other abnormal glucose: Secondary | ICD-10-CM

## 2021-05-27 DIAGNOSIS — Z79899 Other long term (current) drug therapy: Secondary | ICD-10-CM

## 2021-07-18 ENCOUNTER — Other Ambulatory Visit: Payer: Self-pay | Admitting: Nurse Practitioner

## 2021-10-15 DIAGNOSIS — H5212 Myopia, left eye: Secondary | ICD-10-CM | POA: Diagnosis not present

## 2021-10-15 DIAGNOSIS — H5201 Hypermetropia, right eye: Secondary | ICD-10-CM | POA: Diagnosis not present

## 2021-10-15 DIAGNOSIS — H10413 Chronic giant papillary conjunctivitis, bilateral: Secondary | ICD-10-CM | POA: Diagnosis not present

## 2021-10-16 ENCOUNTER — Other Ambulatory Visit: Payer: Self-pay | Admitting: Nurse Practitioner

## 2021-10-23 ENCOUNTER — Encounter: Payer: Self-pay | Admitting: Internal Medicine

## 2021-10-23 NOTE — Patient Instructions (Signed)

## 2021-10-23 NOTE — Progress Notes (Signed)
Annual  Screening/Preventative Visit  & Comprehensive Evaluation & Examination  Future Appointments  Date Time Provider Department  10/24/2021 10:00 AM Unk Pinto, MD [facility]  10/30/2022 10:00 AM Unk Pinto, MD [facility]            This very nice 56 y.o.  MWM  presents for a Screening /Preventative Visit & comprehensive evaluation and management of multiple medical co-morbidities.  Patient has been followed for HTN, HLD, Prediabetes and Vitamin D Deficiency.       HTN predates since 2010. Patient's BP has been controlled at home.  Today's BP is at goal - 138/80. Patient denies any cardiac symptoms as chest pain, palpitations, shortness of breath, dizziness or ankle swelling.       Patient's hyperlipidemia is not  controlled with diet and patient has been reticent to take meds for cholesterol in the past. Last lipids were not at goal :  Lab Results  Component Value Date   CHOL 205 (H) 07/17/2020   HDL 41 07/17/2020   LDLCALC 138 (H) 07/17/2020   TRIG 131 07/17/2020   CHOLHDL 5.0 (H) 07/17/2020         Patient has hx/o prediabetes (A1c 5.7%  /2012)  and patient denies reactive hypoglycemic symptoms, visual blurring, diabetic polys or paresthesias. Last A1c was at goal :   Lab Results  Component Value Date   HGBA1C 5.1 07/17/2020          Finally, patient has history of Vitamin D Deficiency ("35" /2010) and last vitamin D was still low :    Lab Results  Component Value Date   VD25OH 37 07/17/2020     Current Outpatient Medications on File Prior to Visit  Medication Sig   ALPRAZolam 1 MG tablet TAKE 1/2 TO 1 TABLET 2-3 TIMES A DAY ONLY IF NEEDED FOR ANXIETY   Aspirin-APAP-Caffeine (GOODY HEADACHE) Take 1 Package  2  times daily as needed    buPROPion XL 300 MG  Take 1 tablet Daily     CINNAMON  Take 1 capsule daily.   Vitamin B-12 tab  Take 1 tablet  daily.   diclofenac 1 % GEL Apply 4 g topically 4 times daily. Bilateral shoulders   losartan  100 MG  tablet TAKE 1 TABLET DAILY    rosuvastatin  5 MG tablet TAKE 1 TABLET 3 TIMES PER WEEK.   VIAGRA 100 MG tablet TAKE 1/2 TO 1 TABLET AS NEEDED.   TURMERIC Take 2 tablets daily.   vitamin C  500 MG tablet Take every morning.   VITAMIN D PO Take 1 capsule  Does not take every day   zinc gluconate 50 MG tablet Take 50 mg by mouth daily.     Allergies  Allergen Reactions   Lisinopril Cough   Olmesartan Other (See Comments)    Weakness, joint pain   Vicodin [Hydrocodone-Acetaminophen] Other (See Comments)    "makes my skin crawl"     Past Medical History:  Diagnosis Date   Hyperlipidemia    Hypertension    Hypogonadism male    Vitamin D deficiency      Health Maintenance  Topic Date Due   COVID-19 Vaccine (1) Never done   Hepatitis C Screening  Never done   Zoster Vaccines- Shingrix (1 of 2) Never done   COLONOSCOPY  Never done   INFLUENZA VACCINE  Never done   TETANUS/TDAP  07/31/2026   HIV Screening  Completed   HPV VACCINES  Aged Out  Immunization History  Administered Date(s) Administered   DTaP 09/02/2007   PPD Test 06/19/2014, 06/28/2015, 07/31/2016, 03/28/2020    Last Colon - Never & also declines to do Cologard  Past Surgical History:  Procedure Laterality Date   FINGER SURGERY     ROTATOR CUFF REPAIR       Family History  Problem Relation Age of Onset   Heart attack Mother    Kidney failure Father    Hypertension Brother    Hypothyroidism Brother    Hyperlipidemia Brother      Social History   Tobacco Use   Smoking status: Every Day    Packs/day: 1.00    Types: Cigarettes   Smokeless tobacco: Never  Substance Use Topics   Alcohol use: Yes    Alcohol/week: 7.0 standard drinks    Types: 7 Standard drinks or equivalent per week    Comment: occasional   Drug use: No      ROS Constitutional: Denies fever, chills, weight loss/gain, headaches, insomnia,  night sweats or change in appetite. Does c/o fatigue. Eyes: Denies redness,  blurred vision, diplopia, discharge, itchy or watery eyes.  ENT: Denies discharge, congestion, post nasal drip, epistaxis, sore throat, earache, hearing loss, dental pain, Tinnitus, Vertigo, Sinus pain or snoring.  Cardio: Denies chest pain, palpitations, irregular heartbeat, syncope, dyspnea, diaphoresis, orthopnea, PND, claudication or edema Respiratory: denies cough, dyspnea, DOE, pleurisy, hoarseness, laryngitis or wheezing.  Gastrointestinal: Denies dysphagia, heartburn, reflux, water brash, pain, cramps, nausea, vomiting, bloating, diarrhea, constipation, hematemesis, melena, hematochezia, jaundice or hemorrhoids Genitourinary: Denies dysuria, frequency, urgency, nocturia, hesitancy, discharge, hematuria or flank pain Musculoskeletal: Denies arthralgia, myalgia, stiffness, Jt. Swelling, pain, limp or strain/sprain. Denies Falls. Skin: Denies puritis, rash, hives, warts, acne, eczema or change in skin lesion Neuro: No weakness, tremor, incoordination, spasms, paresthesia or pain Psychiatric: Denies confusion, memory loss or sensory loss. Denies Depression. Endocrine: Denies change in weight, skin, hair change, nocturia, and paresthesia, diabetic polys, visual blurring or hyper / hypo glycemic episodes.  Heme/Lymph: No excessive bleeding, bruising or enlarged lymph nodes.   Physical Exam  BP 138/80    Pulse 82    Temp 97.9 F (36.6 C)    Resp 16    Ht 5\' 8"  (1.727 m)    Wt 169 lb (76.7 kg)    SpO2 98%    BMI 25.70 kg/m   General Appearance: Well nourished and well groomed and in no apparent distress.  Eyes: PERRLA, EOMs, conjunctiva no swelling or erythema, normal fundi and vessels. Sinuses: No frontal/maxillary tenderness ENT/Mouth: EACs patent / TMs  nl. Nares clear without erythema, swelling, mucoid exudates. Oral hygiene is good. No erythema, swelling, or exudate. Tongue normal, non-obstructing. Tonsils not swollen or erythematous. Hearing normal.  Neck: Supple, thyroid not palpable.  No bruits, nodes or JVD. Respiratory: Respiratory effort normal.  BS equal and clear bilateral without rales, rhonci, wheezing or stridor. Cardio: Heart sounds are normal with regular rate and rhythm and no murmurs, rubs or gallops. Peripheral pulses are normal and equal bilaterally without edema. No aortic or femoral bruits. Chest: symmetric with normal excursions and percussion.  Abdomen: Soft, with Nl bowel sounds. Nontender, no guarding, rebound, hernias, masses, or organomegaly.  Lymphatics: Non tender without lymphadenopathy.  Musculoskeletal: Full ROM all peripheral extremities, joint stability, 5/5 strength, and normal gait. Skin: Warm and dry without rashes, lesions, cyanosis, clubbing or  ecchymosis.  Neuro: Cranial nerves intact, reflexes equal bilaterally. Normal muscle tone, no cerebellar symptoms. Sensation intact.  Pysch: Alert and  oriented X 3 with normal affect, insight and judgment appropriate.   Assessment and Plan  1. Annual Preventative/Screening Exam    2. Essential hypertension  - EKG 12-Lead - [place], RETROPERITNL ABD,  LTD - Urinalysis, Routine w reflex microscopic - Microalbumin / creatinine urine ratio - CBC with Differential/Platelet - COMPLETE METABOLIC PANEL WITH GFR - Magnesium - TSH  3. Hyperlipidemia, mixed  - EKG 12-Lead - [place], RETROPERITNL ABD,  LTD - Lipid panel - TSH  4. Abnormal glucose  - EKG 12-Lead - [place], RETROPERITNL ABD,  LTD - Hemoglobin A1c - Insulin, random  5. Vitamin D deficiency  - VITAMIN D 25 Hydroxy   6. Screening for colorectal cancer  - POC Hemoccult Bld/Stl   7. Screening examination for pulmonary tuberculosis   8. Prostate cancer screening  - PSA  9. Screening for ischemic heart disease  - EKG 12-Lead  10. FHx: heart disease  - EKG 12-Lead - [place], RETROPERITNL ABD,  LTD  11. Smoker  - EKG 12-Lead - [place], RETROPERITNL ABD,  LTD  12. Screening for AAA (aortic abdominal aneurysm)  - [place], RETROPERITNL  ABD,  LTD  13. Fatigue - Iron, Total/Total Iron Binding Cap - Vitamin B12 - Testosterone  14. Medication management  - Urinalysis, Routine w reflex microscopic - Microalbumin / creatinine urine ratio - CBC with Differential/Platelet - COMPLETE METABOLIC PANEL WITH GFR - Magnesium - Lipid panel - TSH - Hemoglobin A1c - Insulin, random - VITAMIN D 25 Hydroxy         Patient was counseled in prudent diet, weight control to achieve/maintain BMI less than 25, BP monitoring, regular exercise and medications as discussed.  Discussed med effects and SE's. Routine screening labs and tests as requested with regular follow-up as recommended. Over 40 minutes of exam, counseling, chart review and high complex critical decision making was performed   Kirtland Bouchard, MD

## 2021-10-24 ENCOUNTER — Ambulatory Visit (INDEPENDENT_AMBULATORY_CARE_PROVIDER_SITE_OTHER): Payer: BC Managed Care – PPO | Admitting: Internal Medicine

## 2021-10-24 ENCOUNTER — Other Ambulatory Visit: Payer: Self-pay

## 2021-10-24 ENCOUNTER — Encounter: Payer: Self-pay | Admitting: Internal Medicine

## 2021-10-24 VITALS — BP 138/80 | HR 82 | Temp 97.9°F | Resp 16 | Ht 68.0 in | Wt 169.0 lb

## 2021-10-24 DIAGNOSIS — E782 Mixed hyperlipidemia: Secondary | ICD-10-CM

## 2021-10-24 DIAGNOSIS — R5383 Other fatigue: Secondary | ICD-10-CM

## 2021-10-24 DIAGNOSIS — Z13220 Encounter for screening for lipoid disorders: Secondary | ICD-10-CM | POA: Diagnosis not present

## 2021-10-24 DIAGNOSIS — Z111 Encounter for screening for respiratory tuberculosis: Secondary | ICD-10-CM

## 2021-10-24 DIAGNOSIS — E559 Vitamin D deficiency, unspecified: Secondary | ICD-10-CM | POA: Diagnosis not present

## 2021-10-24 DIAGNOSIS — R350 Frequency of micturition: Secondary | ICD-10-CM | POA: Diagnosis not present

## 2021-10-24 DIAGNOSIS — Z130 Encounter for screening for diseases of the blood and blood-forming organs and certain disorders involving the immune mechanism: Secondary | ICD-10-CM

## 2021-10-24 DIAGNOSIS — N401 Enlarged prostate with lower urinary tract symptoms: Secondary | ICD-10-CM | POA: Diagnosis not present

## 2021-10-24 DIAGNOSIS — Z8249 Family history of ischemic heart disease and other diseases of the circulatory system: Secondary | ICD-10-CM

## 2021-10-24 DIAGNOSIS — Z131 Encounter for screening for diabetes mellitus: Secondary | ICD-10-CM

## 2021-10-24 DIAGNOSIS — Z0000 Encounter for general adult medical examination without abnormal findings: Secondary | ICD-10-CM

## 2021-10-24 DIAGNOSIS — Z1211 Encounter for screening for malignant neoplasm of colon: Secondary | ICD-10-CM

## 2021-10-24 DIAGNOSIS — Z1389 Encounter for screening for other disorder: Secondary | ICD-10-CM | POA: Diagnosis not present

## 2021-10-24 DIAGNOSIS — Z1329 Encounter for screening for other suspected endocrine disorder: Secondary | ICD-10-CM | POA: Diagnosis not present

## 2021-10-24 DIAGNOSIS — Z125 Encounter for screening for malignant neoplasm of prostate: Secondary | ICD-10-CM

## 2021-10-24 DIAGNOSIS — Z136 Encounter for screening for cardiovascular disorders: Secondary | ICD-10-CM

## 2021-10-24 DIAGNOSIS — Z0001 Encounter for general adult medical examination with abnormal findings: Secondary | ICD-10-CM

## 2021-10-24 DIAGNOSIS — F17200 Nicotine dependence, unspecified, uncomplicated: Secondary | ICD-10-CM

## 2021-10-24 DIAGNOSIS — Z79899 Other long term (current) drug therapy: Secondary | ICD-10-CM

## 2021-10-24 DIAGNOSIS — I10 Essential (primary) hypertension: Secondary | ICD-10-CM

## 2021-10-24 DIAGNOSIS — R7309 Other abnormal glucose: Secondary | ICD-10-CM

## 2021-10-25 ENCOUNTER — Other Ambulatory Visit: Payer: Self-pay | Admitting: Internal Medicine

## 2021-10-25 DIAGNOSIS — E782 Mixed hyperlipidemia: Secondary | ICD-10-CM

## 2021-10-25 LAB — CBC WITH DIFFERENTIAL/PLATELET
Absolute Monocytes: 342 cells/uL (ref 200–950)
Basophils Absolute: 71 cells/uL (ref 0–200)
Basophils Relative: 1.2 %
Eosinophils Absolute: 472 cells/uL (ref 15–500)
Eosinophils Relative: 8 %
HCT: 45.7 % (ref 38.5–50.0)
Hemoglobin: 15.4 g/dL (ref 13.2–17.1)
Lymphs Abs: 1941 cells/uL (ref 850–3900)
MCH: 28.5 pg (ref 27.0–33.0)
MCHC: 33.7 g/dL (ref 32.0–36.0)
MCV: 84.6 fL (ref 80.0–100.0)
MPV: 10.6 fL (ref 7.5–12.5)
Monocytes Relative: 5.8 %
Neutro Abs: 3074 cells/uL (ref 1500–7800)
Neutrophils Relative %: 52.1 %
Platelets: 203 10*3/uL (ref 140–400)
RBC: 5.4 10*6/uL (ref 4.20–5.80)
RDW: 13.6 % (ref 11.0–15.0)
Total Lymphocyte: 32.9 %
WBC: 5.9 10*3/uL (ref 3.8–10.8)

## 2021-10-25 LAB — LIPID PANEL
Cholesterol: 257 mg/dL — ABNORMAL HIGH (ref <200)
HDL: 41 mg/dL (ref >=40)
LDL Cholesterol (Calc): 185 mg/dL (calc) — ABNORMAL HIGH
Non-HDL Cholesterol (Calc): 216 mg/dL (calc) — ABNORMAL HIGH (ref <130)
Total CHOL/HDL Ratio: 6.3 (calc) — ABNORMAL HIGH (ref <5.0)
Triglycerides: 162 mg/dL — ABNORMAL HIGH (ref <150)

## 2021-10-25 LAB — URINALYSIS, ROUTINE W REFLEX MICROSCOPIC
Bilirubin Urine: NEGATIVE
Glucose, UA: NEGATIVE
Hgb urine dipstick: NEGATIVE
Ketones, ur: NEGATIVE
Leukocytes,Ua: NEGATIVE
Nitrite: NEGATIVE
Protein, ur: NEGATIVE
Specific Gravity, Urine: 1.003 (ref 1.001–1.035)
pH: 6.5 (ref 5.0–8.0)

## 2021-10-25 LAB — COMPLETE METABOLIC PANEL WITH GFR
AG Ratio: 2 (calc) (ref 1.0–2.5)
ALT: 10 U/L (ref 9–46)
AST: 14 U/L (ref 10–35)
Albumin: 4.4 g/dL (ref 3.6–5.1)
Alkaline phosphatase (APISO): 68 U/L (ref 35–144)
BUN: 14 mg/dL (ref 7–25)
CO2: 27 mmol/L (ref 20–32)
Calcium: 9.5 mg/dL (ref 8.6–10.3)
Chloride: 104 mmol/L (ref 98–110)
Creat: 1.17 mg/dL (ref 0.70–1.30)
Globulin: 2.2 g/dL (calc) (ref 1.9–3.7)
Glucose, Bld: 87 mg/dL (ref 65–99)
Potassium: 4.4 mmol/L (ref 3.5–5.3)
Sodium: 138 mmol/L (ref 135–146)
Total Bilirubin: 0.5 mg/dL (ref 0.2–1.2)
Total Protein: 6.6 g/dL (ref 6.1–8.1)
eGFR: 74 mL/min/{1.73_m2} (ref >=60)

## 2021-10-25 LAB — PSA: PSA: 0.52 ng/mL (ref <=4.00)

## 2021-10-25 LAB — MICROALBUMIN / CREATININE URINE RATIO
Creatinine, Urine: 16 mg/dL — ABNORMAL LOW (ref 20–320)
Microalb, Ur: <0.2 mg/dL

## 2021-10-25 LAB — VITAMIN B12: Vitamin B-12: 353 pg/mL (ref 200–1100)

## 2021-10-25 LAB — TSH: TSH: 0.82 mIU/L (ref 0.40–4.50)

## 2021-10-25 LAB — IRON, TOTAL/TOTAL IRON BINDING CAP
%SAT: 18 % (calc) — ABNORMAL LOW (ref 20–48)
Iron: 64 ug/dL (ref 50–180)
TIBC: 349 mcg/dL (calc) (ref 250–425)

## 2021-10-25 LAB — VITAMIN D 25 HYDROXY (VIT D DEFICIENCY, FRACTURES): Vit D, 25-Hydroxy: 23 ng/mL — ABNORMAL LOW (ref 30–100)

## 2021-10-25 LAB — HEMOGLOBIN A1C
Hgb A1c MFr Bld: 5.1 % of total Hgb (ref <5.7)
Mean Plasma Glucose: 100 mg/dL
eAG (mmol/L): 5.5 mmol/L

## 2021-10-25 LAB — MAGNESIUM: Magnesium: 2.1 mg/dL (ref 1.5–2.5)

## 2021-10-25 LAB — TESTOSTERONE: Testosterone: 719 ng/dL (ref 250–827)

## 2021-10-25 LAB — INSULIN, RANDOM: Insulin: 6.9 u[IU]/mL

## 2021-10-25 MED ORDER — EZETIMIBE 10 MG PO TABS
ORAL_TABLET | ORAL | 3 refills | Status: DC
Start: 2021-10-25 — End: 2022-11-13

## 2021-10-25 NOTE — Progress Notes (Signed)
=============================================================== - Test results slightly outside the reference range are not unusual. If there is anything important, I will review this with you,  otherwise it is considered normal test values.  If you have further questions,  please do not hesitate to contact me at the office or via My Chart.  =============================================================== ===============================================================  -  Iron levels sl low & may cause fatigue , weakness. Recc take an OTC iron tablet. Also,   Eat more Veggies with Iron as   Carrots, Beets, all leafy green veggies as   Spinach, Collards,  Turnip - Mustard or Mixed Greens, Kale,   Asparagus, Broccoli, Brussel Sprouts,   Green Beans / peas, Soybeans, Lentils, Sweet Potatoes =============================================================== ===============================================================  -  Vitamin B12 =   353   Very Low   (Ideal or Goal Vit B12 is between 450 - 1,100)     Low Vit B12 may be associated with Anemia , Fatigue, Peripheral Neuropathy,   Dementia, Psychosis"Brain Fog", Impotence & Depression   - Recommend take a sub-lingual form of Vitamin B12 tablet   1,000 to 5,000 mcg tab that you dissolve under your tongue /Daily   - Can get Baron Sane - best price at [employer] or on [employer] =============================================================== ===============================================================  -  Total  Chol =  257     - Elevated             (  Ideal  or  Goal is less than 180  !  )   - and   -  Bad / Dangerous LDL  Chol = 185   - also Elevated              (  Ideal  or  Goal is less than 70  !  )   - Sent in Rx for Zetia = Ezetimibe - - works in the intestine &   does not even absorb into the blood stream , so can't cause side effects !    - Diet is still very important - Cholesterol is still the biggest "Killer  " in [place]  !    - Recommend  a stricter low cholesterol diet   - Cholesterol only comes from animal sources  - ie. meat, dairy, egg yolks  - Eat all the vegetables you want.  - Avoid meat, especially red meat - Beef AND Pork .  - Avoid cheese & dairy - milk & ice cream.     - Cheese is the most concentrated form of trans-fats which  is the worst thing to clog up our arteries.   - Veggie cheese is OK which can be found in the fresh  produce section at [employer] or [employer] or [employer] =============================================================== ===============================================================  -  Vitamin D = 23 - Extremely low   - Vitamin D goal is between 70-100.   - Please take  Vitamin D 5,000 unit capsules x 2 caps = 10,000 units every day    - It is very important as a natural anti-inflammatory and helping the  immune system protect against viral infections, like the Covid-19    helping hair, skin, and nails, as well as reducing stroke and  heart attack risk.   - It helps your bones and helps with mood.  - It also decreases numerous cancer risks so please  take it as directed.   - Low Vit D is associated with a 200-300% higher risk for  CANCER   and 200-300% higher risk for HEART  ATTACK  &  STROKE.    - It is also associated with higher death rate at younger ages,   autoimmune diseases like Rheumatoid arthritis, Lupus,  Multiple Sclerosis.     - Also many other serious conditions, like depression, Alzheimer's  Dementia, infertility, muscle aches, fatigue, fibromyalgia   - just to name a few. =============================================================== ===============================================================  -  PSA - Low - Great !  =============================================================== ===============================================================  -  Testosterone - Normal  &  OK   =============================================================== ===============================================================  -  A1c - Normal - No Diabetes   - Great  ! =============================================================== ===============================================================  -  All Else - CBC - Kidneys - Electrolytes - Liver - Magnesium & Thyroid    - all  Normal / OK =============================================================== ===============================================================

## 2022-01-13 ENCOUNTER — Other Ambulatory Visit: Payer: Self-pay | Admitting: Adult Health Nurse Practitioner

## 2022-01-13 ENCOUNTER — Other Ambulatory Visit: Payer: Self-pay | Admitting: Nurse Practitioner

## 2022-01-13 DIAGNOSIS — I10 Essential (primary) hypertension: Secondary | ICD-10-CM

## 2022-02-14 ENCOUNTER — Ambulatory Visit: Payer: BC Managed Care – PPO | Admitting: Adult Health

## 2022-03-17 ENCOUNTER — Other Ambulatory Visit: Payer: Self-pay | Admitting: Internal Medicine

## 2022-03-17 DIAGNOSIS — N528 Other male erectile dysfunction: Secondary | ICD-10-CM

## 2022-04-07 ENCOUNTER — Other Ambulatory Visit: Payer: Self-pay | Admitting: Internal Medicine

## 2022-04-20 NOTE — Progress Notes (Unsigned)
    Future Appointments  Date Time Provider Department  04/21/2022 11:30 AM [name], MD [facility]  05/19/2022  9:30 AM [name], MD [facility]  10/30/2022 10:00 AM [name], MD [facility]    History of Present Illness:      This 56 y.o.  MWM  with HTN, HLD, Prediabetes and Vitamin D Deficiency presents with concerns of "stomach pains" .         Medications     ezetimibe (ZETIA) 10 MG tablet, Take  1 tablet  Daily  for Cholesterol   losartan (COZAAR) 100 MG tablet, TAKE 1 TABLET DAILY FOR BLOOD PRESSURE.   sildenafil (VIAGRA) 100 MG tablet, TAKE 1/2 TO 1 TABLET AS NEEDED.   Aspirin-Acetaminophen-Caffeine (GOODY HEADACHE ), Take 1 Package by mouth 2 (two) times daily as needed (for pain).   B-12 , Take 1 tablet daily.   ALPRAZolam (XANAX) 1 MG tablet, TAKE 1/2 TO 1 TABLET 2-3 TIMES A DAY ONLY IF NEEDED FOR ANXIETY. TRY TO LIMIT TO 5 DAYS/WK.   CINNAMON , Take 1 capsule  daily.   diclofenac 1 % GEL, Apply 4 g topically 4 (four) times daily. Bilateral shoulders   TURMERIC , Take 2 tablets daily.   vitamin C 500 MG tablet, Take  every morning.   VITAMIN D , Take 1 capsule -   Does not take every day   zinc 50 MG tablet, Take daily.  Problem list He has Hyperlipidemia, mixed; Vitamin D deficiency; Essential hypertension; Anxiety state; Medication management; Poor compliance; Abnormal glucose; GERD ; and Overweight (BMI 25.0-29.9) on their problem list.   Observations/Objective:  There were no vitals taken for this visit.  HEENT - WNL. Neck - supple.  Chest - Clear equal BS. Cor - Nl HS. RRR w/o sig MGR. PP 1(+). No edema. MS- FROM w/o deformities.  Gait Nl. Neuro -  Nl w/o focal abnormalities.   Assessment and Plan:      Follow Up Instructions:        I discussed the assessment and treatment plan with the patient. The patient was provided an opportunity to ask questions and all were answered. The patient agreed with the plan and demonstrated an  understanding of the instructions.       The patient was advised to call back or seek an in-person evaluation if the symptoms worsen or if the condition fails to improve as anticipated.    Marinus Maw, MD

## 2022-04-21 ENCOUNTER — Encounter: Payer: Self-pay | Admitting: Internal Medicine

## 2022-04-21 ENCOUNTER — Ambulatory Visit (HOSPITAL_BASED_OUTPATIENT_CLINIC_OR_DEPARTMENT_OTHER)
Admission: RE | Admit: 2022-04-21 | Discharge: 2022-04-21 | Disposition: A | Discharge status: Home / Self Care | Payer: BC Managed Care – PPO | Source: Ambulatory Visit | Attending: Internal Medicine | Admitting: Internal Medicine

## 2022-04-21 ENCOUNTER — Ambulatory Visit (INDEPENDENT_AMBULATORY_CARE_PROVIDER_SITE_OTHER): Payer: BC Managed Care – PPO | Admitting: Internal Medicine

## 2022-04-21 VITALS — BP 140/80 | HR 76 | Temp 97.8°F | Resp 16 | Ht 68.0 in | Wt 165.8 lb

## 2022-04-21 DIAGNOSIS — R1011 Right upper quadrant pain: Secondary | ICD-10-CM | POA: Insufficient documentation

## 2022-04-21 DIAGNOSIS — R110 Nausea: Secondary | ICD-10-CM | POA: Diagnosis not present

## 2022-04-21 LAB — CBC WITH DIFFERENTIAL/PLATELET
Absolute Monocytes: 459 cells/uL (ref 200–950)
Basophils Absolute: 41 cells/uL (ref 0–200)
Basophils Relative: 0.4 %
Eosinophils Absolute: 286 cells/uL (ref 15–500)
Eosinophils Relative: 2.8 %
HCT: 43.1 % (ref 38.5–50.0)
Hemoglobin: 14.9 g/dL (ref 13.2–17.1)
Lymphs Abs: 2030 cells/uL (ref 850–3900)
MCH: 28.7 pg (ref 27.0–33.0)
MCHC: 34.6 g/dL (ref 32.0–36.0)
MCV: 83 fL (ref 80.0–100.0)
MPV: 10 fL (ref 7.5–12.5)
Monocytes Relative: 4.5 %
Neutro Abs: 7385 cells/uL (ref 1500–7800)
Neutrophils Relative %: 72.4 %
Platelets: 197 10*3/uL (ref 140–400)
RBC: 5.19 10*6/uL (ref 4.20–5.80)
RDW: 13 % (ref 11.0–15.0)
Total Lymphocyte: 19.9 %
WBC: 10.2 10*3/uL (ref 3.8–10.8)

## 2022-04-21 LAB — COMPLETE METABOLIC PANEL WITH GFR
AG Ratio: 1.7 (calc) (ref 1.0–2.5)
ALT: 11 U/L (ref 9–46)
AST: 16 U/L (ref 10–35)
Albumin: 4.2 g/dL (ref 3.6–5.1)
Alkaline phosphatase (APISO): 78 U/L (ref 35–144)
BUN: 11 mg/dL (ref 7–25)
CO2: 30 mmol/L (ref 20–32)
Calcium: 9.3 mg/dL (ref 8.6–10.3)
Chloride: 104 mmol/L (ref 98–110)
Creat: 1.21 mg/dL (ref 0.70–1.30)
Globulin: 2.5 g/dL (calc) (ref 1.9–3.7)
Glucose, Bld: 84 mg/dL (ref 65–99)
Potassium: 4.3 mmol/L (ref 3.5–5.3)
Sodium: 140 mmol/L (ref 135–146)
Total Bilirubin: 0.4 mg/dL (ref 0.2–1.2)
Total Protein: 6.7 g/dL (ref 6.1–8.1)
eGFR: 70 mL/min/{1.73_m2} (ref >=60)

## 2022-04-21 LAB — AMYLASE: Amylase: 35 U/L (ref 21–101)

## 2022-04-21 LAB — LIPASE: Lipase: 20 U/L (ref 7–60)

## 2022-04-21 NOTE — Progress Notes (Signed)
<><><><><><><><><><><><><><><><><><><><><><><><><><><><><><><><><> <><><><><><><><><><><><><><><><><><><><><><><><><><><><><><><><><>  -   Gallbladder Ultrasound is normal w/o signs of gall stones or inflammation   ( Wife informed & discussed diet for next few days )   <><><><><><><><><><><><><><><><><><><><><><><><><><><><><><><><><> <><><><><><><><><><><><><><><><><><><><><><><><><><><><><><><><><>

## 2022-04-21 NOTE — Progress Notes (Signed)
<><><><><><><><><><><><><><><><><><><><><><><><><><><><><><><><><> <><><><><><><><><><><><><><><><><><><><><><><><><><><><><><><><><>  -   Labs all Normal & OK   - CBC shows Normal Hgb ( Red Cell count ) & Normal WBC ( No Infection )  <><><><><><><><><><><><><><><><><><><><><><><><><><><><><><><><><>  - Amylase & Lipase enzymes are normal to rule out Pancreas or  Gallbladder problem  <><><><><><><><><><><><><><><><><><><><><><><><><><><><><><><><><>  - CMET  shows All Else - Glucose -  Kidneys - Electrolytes  &  Liver   - all  Normal / OK  <><><><><><><><><><><><><><><><><><><><><><><><><><><><><><><><><> <><><><><><><><><><><><><><><><><><><><><><><><><><><><><><><><><>

## 2022-05-18 ENCOUNTER — Encounter: Payer: Self-pay | Admitting: Internal Medicine

## 2022-05-18 NOTE — Progress Notes (Unsigned)
6 month Office Visit   Future Appointments  Date Time Provider Department  05/19/2022  9:30 AM [name], MD [facility]  10/30/2022 10:00 AM [name], MD [facility]           This very nice 56 y.o.  MWM  presents for  follow-up of HTN, HLD, Prediabetes and Vitamin D Deficiency.       HTN predates circa 2010. Patient's BP has been controlled and today's BP is                                   . Patient denies any cardiac symptoms as chest pain, palpitations, shortness of breath, dizziness or ankle swelling.       Patient's hyperlipidemia is not  controlled with diet and patient has been reticent to take meds for cholesterol in the past. Last lipids were not at goal :  Lab Results  Component Value Date   CHOL 257 (H) 10/24/2021   HDL 41 10/24/2021   LDLCALC 185 (H) 10/24/2021   TRIG 162 (H) 10/24/2021   CHOLHDL 6.3 (H) 10/24/2021         Patient has hx/o prediabetes (A1c 5.7%  /2012) and patient denies reactive hypoglycemic symptoms, visual blurring, diabetic polys or paresthesias. Last A1c was at goal :   Lab Results  Component Value Date   HGBA1C 5.1 10/24/2021         Finally, patient has history of Vitamin D Deficiency ("35" /2010) and does not take recommended 10,000 units /daily and last vitamin D was still very low  :    Lab Results  Component Value Date   VD25OH 23 (L) 10/24/2021       Current Outpatient Medications:    ALPRAZolam (XANAX) 1 MG tablet, TAKE 1/2 TO 1 TABLET 2-3 TIMES A DAY ONLY IF NEEDED FOR ANXIETY. TRY TO LIMIT TO 5 DAYS/WK., Disp: 75 tablet, Rfl: 0   Aspirin-Acetaminophen-Caffeine (GOODY HEADACHE PO), Take 1 Package by mouth 2 (two) times daily as needed (for pain)., Disp: , Rfl:    CINNAMON PO, Take 1 capsule by mouth daily., Disp: , Rfl:    Cyanocobalamin (B-12 PO), Take 1 tablet by mouth daily., Disp: , Rfl:    diclofenac sodium (VOLTAREN) 1 % GEL, Apply 4 g topically 4 (four) times daily. Bilateral shoulders, Disp: 1  Tube, Rfl: 2   ezetimibe (ZETIA) 10 MG tablet, Take  1 tablet  Daily  for Cholesterol, Disp: 90 tablet, Rfl: 3   losartan (COZAAR) 100 MG tablet, TAKE 1 TABLET DAILY FOR BLOOD PRESSURE., Disp: 90 tablet, Rfl: 3   sildenafil (VIAGRA) 100 MG tablet, TAKE 1/2 TO 1 TABLET AS NEEDED., Disp: 30 tablet, Rfl: 0   TURMERIC PO, Take 2 tablets by mouth daily., Disp: , Rfl:    vitamin C (ASCORBIC ACID) 500 MG tablet, Take 500 mg by mouth every morning., Disp: , Rfl:    VITAMIN D PO, Take 1 capsule by mouth. Does not take every day, Disp: , Rfl:    zinc gluconate 50 MG tablet, Take 50 mg by mouth daily., Disp: , Rfl:     Allergies  Allergen Reactions   Lisinopril Cough   Olmesartan Weakness, joint pain   Vicodin [Hydrocodone-Acetaminophen] "makes my skin crawl"     Past Medical History:  Diagnosis Date   Hyperlipidemia    Hypertension    Hypogonadism male    Vitamin D deficiency  Health Maintenance  Topic Date Due   COVID-19 Vaccine (1) Never done   Hepatitis C Screening  Never done   Zoster Vaccines- Shingrix (1 of 2) Never done   COLONOSCOPY  Never done   INFLUENZA VACCINE  Never done   TETANUS/TDAP  07/31/2026   HIV Screening  Completed   HPV VACCINES  Aged Out     Immunization History  Administered Date(s) Administered   DTaP 09/02/2007   PPD Test 06/19/2014, 06/28/2015, 07/31/2016, 03/28/2020    Last Colon - Never & also declines to do Cologard   Past Surgical History:  Procedure Laterality Date   FINGER SURGERY     ROTATOR CUFF REPAIR       Family History  Problem Relation Age of Onset   Heart attack Mother    Kidney failure Father    Hypertension Brother    Hypothyroidism Brother    Hyperlipidemia Brother      Social History   Tobacco Use   Smoking status: Every Day    Packs/day: 1.00    Types: Cigarettes   Smokeless tobacco: Never  Substance Use Topics   Alcohol use: Yes    Alcohol/week: 7.0 standard drinks    Types: 7 Standard drinks or  equivalent per week    Comment: occasional   Drug use: No      ROS Constitutional: Denies fever, chills, weight loss/gain, headaches, insomnia,  night sweats or change in appetite. Does c/o fatigue. Eyes: Denies redness, blurred vision, diplopia, discharge, itchy or watery eyes.  ENT: Denies discharge, congestion, post nasal drip, epistaxis, sore throat, earache, hearing loss, dental pain, Tinnitus, Vertigo, Sinus pain or snoring.  Cardio: Denies chest pain, palpitations, irregular heartbeat, syncope, dyspnea, diaphoresis, orthopnea, PND, claudication or edema Respiratory: denies cough, dyspnea, DOE, pleurisy, hoarseness, laryngitis or wheezing.  Gastrointestinal: Denies dysphagia, heartburn, reflux, water brash, pain, cramps, nausea, vomiting, bloating, diarrhea, constipation, hematemesis, melena, hematochezia, jaundice or hemorrhoids Genitourinary: Denies dysuria, frequency, urgency, nocturia, hesitancy, discharge, hematuria or flank pain Musculoskeletal: Denies arthralgia, myalgia, stiffness, Jt. Swelling, pain, limp or strain/sprain. Denies Falls. Skin: Denies puritis, rash, hives, warts, acne, eczema or change in skin lesion Neuro: No weakness, tremor, incoordination, spasms, paresthesia or pain Psychiatric: Denies confusion, memory loss or sensory loss. Denies Depression. Endocrine: Denies change in weight, skin, hair change, nocturia, and paresthesia, diabetic polys, visual blurring or hyper / hypo glycemic episodes.  Heme/Lymph: No excessive bleeding, bruising or enlarged lymph nodes.   Physical Exam  There were no vitals taken for this visit.  General Appearance: Well nourished and well groomed and in no apparent distress.  Eyes: PERRLA, EOMs, conjunctiva no swelling or erythema, normal fundi and vessels. Sinuses: No frontal/maxillary tenderness ENT/Mouth: EACs patent / TMs  nl. Nares clear without erythema, swelling, mucoid exudates. Oral hygiene is good. No erythema,  swelling, or exudate. Tongue normal, non-obstructing. Tonsils not swollen or erythematous. Hearing normal.  Neck: Supple, thyroid not palpable. No bruits, nodes or JVD. Respiratory: Respiratory effort normal.  BS equal and clear bilateral without rales, rhonci, wheezing or stridor. Cardio: Heart sounds are normal with regular rate and rhythm and no murmurs, rubs or gallops. Peripheral pulses are normal and equal bilaterally without edema. No aortic or femoral bruits. Chest: symmetric with normal excursions and percussion.  Abdomen: Soft, with Nl bowel sounds. Nontender, no guarding, rebound, hernias, masses, or organomegaly.  Lymphatics: Non tender without lymphadenopathy.  Musculoskeletal: Full ROM all peripheral extremities, joint stability, 5/5 strength, and normal gait. Skin: Warm and dry without  rashes, lesions, cyanosis, clubbing or  ecchymosis.  Neuro: Cranial nerves intact, reflexes equal bilaterally. Normal muscle tone, no cerebellar symptoms. Sensation intact.  Pysch: Alert and oriented X 3 with normal affect, insight and judgment appropriate.   Assessment and Plan  1. Essential hypertension  - CBC with Differential/Platelet - COMPLETE METABOLIC PANEL WITH GFR - Magnesium - TSH  2. Hyperlipidemia, mixed  - Lipid panel - TSH  3. Abnormal glucose  - Hemoglobin A1c - Insulin, random  4. Vitamin D deficiency  - VITAMIN D 25 Hydroxy   5. Medication management  - CBC with Differential/Platelet - COMPLETE METABOLIC PANEL WITH GFR - Magnesium - Lipid panel - TSH - Hemoglobin A1c - Insulin, random - VITAMIN D 25 Hydroxy        Patient was counseled in prudent diet, weight control to achieve/maintain BMI less than 25, BP monitoring, regular exercise and medications as discussed.  Discussed med effects and SE's. Routine screening labs and tests as requested with regular follow-up as recommended. Over 40 minutes of exam, counseling, chart review and high complex critical  decision making was performed   Kirtland Bouchard, MD

## 2022-05-18 NOTE — Patient Instructions (Addendum)
Due to recent changes in healthcare laws, you may see the results of your imaging and laboratory studies on MyChart before your provider has had a chance to review them.  We understand that in some cases there may be results that are confusing or concerning to you. Not all laboratory results come back in the same time frame and the provider may be waiting for multiple results in order to interpret others.  Please give [place] 48 hours in order for your provider to thoroughly review all the results before contacting the office for clarification of your results.  ++++++++++++++++++++++++++   - Treating with meds                                     to lower Cholesterol is treating the result                                                                                               & NOT  treating the cause  - The cause is Bad Diet !   - Please Read or listen to    Dr Glenard Haring 's book    " How Not to Die ! "    - Recommend  a stricter plant based low cholesterol diet   - Cholesterol only comes from animal sources                                                                             - ie. meat, dairy, egg yolks - Eat all the vegetables you want  - Avoid meat,   Avoid Meat,   Avoid Meat   !  !  !                                                                   - especially red meat - Beef AND Pork   - Avoid cheese & dairy - milk & ice cream.     - Cheese is the most concentrated form of trans-fats which                                                               is the worst thing to clog up our arteries  - Veggie cheese is OK which can be found in the  fresh produce section at [employer] or                                                                                               [employer] or [employer] ============================================================ ============================================================  - As a  last resort, you can take medicines if you refuse to improve your diet  ============================================================ ============================================================    Vit D  & Vit C 1,000 mg   are recommended to help protect  against the Covid-19 and other Corona viruses.    Also it's recommended  to take  Zinc 50 mg  to help  protect against the Covid-19   and best place to get  is also on [email or web address]  and don't pay more than 6-8 cents /pill !   +++++++++++++++++++++++++++++++++++++++  Recommend Adult Low Dose Aspirin or  coated  Aspirin 81 mg daily  To reduce risk of Colon Cancer 40 %,  Skin Cancer 26 % ,  Melanoma 46%  and  Pancreatic cancer 60% +++++++++++++++++++++++++++++++++++++++++ Vitamin D goal  is between 70-100.  Please make sure that you are taking your Vitamin D as directed.  It is very important as a natural anti-inflammatory  helping hair, skin, and nails, as well as reducing stroke and heart attack risk.  It helps your bones and helps with mood. It also decreases numerous cancer risks so please take it as directed.  Low Vit D is associated with a 200-300% higher risk for CANCER  and 200-300% higher risk for HEART   ATTACK  &  STROKE.   .....................................Marland Kitchen. It is also associated with higher death rate at younger ages,  autoimmune diseases like Rheumatoid arthritis, Lupus, Multiple Sclerosis.    Also many other serious conditions, like depression, Alzheimer's Dementia, infertility, muscle aches, fatigue, fibromyalgia - just to name a few. +++++++++++++++++++++++++++++++++++++++++ Recommend the book "The END of DIETING" by Dr Monico HoarJoel Fuhrman  & the book "The END of DIABETES " by Dr Monico HoarJoel Fuhrman At [email or web address] - get book & Audio CD's    Being diabetic has a  300% increased risk for heart attack, stroke, cancer, and alzheimer- type vascular dementia. It is very important that you work harder with diet by avoiding  all foods that are white. Avoid white rice (brown & wild rice is OK), white potatoes (sweetpotatoes in moderation is OK), White bread or wheat bread or anything made out of white flour like bagels, donuts, rolls, buns, biscuits, cakes, pastries, cookies, pizza crust, and pasta (made from white flour & egg whites) - vegetarian pasta or spinach or wheat pasta is OK. Multigrain breads like Arnold's or Pepperidge Farm, or multigrain sandwich thins or flatbreads.  Diet, exercise and weight loss can reverse and cure diabetes in the early stages.  Diet, exercise and weight loss is very important in the control and prevention of complications of diabetes which affects every system in your body, ie. Brain - dementia/stroke, eyes - glaucoma/blindness, heart - heart attack/heart failure, kidneys - dialysis, stomach - gastric paralysis, intestines - malabsorption, nerves - severe painful neuritis, circulation - gangrene & loss of a leg(s), and finally cancer and Alzheimers.  I recommend avoid fried & greasy foods,  sweets/candy, white rice (brown or wild rice or Quinoa is OK), white potatoes (sweet potatoes are OK) - anything made from white flour - bagels, doughnuts, rolls, buns, biscuits,white and wheat breads, pizza crust and traditional pasta made of white flour & egg white(vegetarian pasta or spinach or wheat pasta is OK).  Multi-grain bread is OK - like multi-grain flat bread or sandwich thins. Avoid alcohol in excess. Exercise is also important.    Eat all the vegetables you want - avoid meat, especially red meat and dairy - especially cheese.  Cheese is the most concentrated form of trans-fats which is the worst thing to clog up our arteries. Veggie cheese is OK which can be found in the fresh produce section at [employer] or [employer] or [employer]  +++++++++++++++++++++++++++++++++++++++  DASH Eating Plan  DASH stands for "Dietary Approaches to Stop Hypertension."   The DASH eating plan is a healthy  eating plan that has been shown to reduce high blood pressure (hypertension). Additional health benefits may include reducing the risk of type 2 diabetes mellitus, heart disease, and stroke. The DASH eating plan may also help with weight loss. WHAT DO I NEED TO KNOW ABOUT THE DASH EATING PLAN? For the DASH eating plan, you will follow these general guidelines: Choose foods with a percent daily value for sodium of less than 5% (as listed on the food label). Use salt-free seasonings or herbs instead of table salt or sea salt. Check with your health care provider or pharmacist before using salt substitutes. Eat lower-sodium products, often labeled as "lower sodium" or "no salt added." Eat fresh foods. Eat more vegetables, fruits, and low-fat dairy products. Choose whole grains. Look for the word "whole" as the first word in the ingredient list. Choose fish  Limit sweets, desserts, sugars, and sugary drinks. Choose heart-healthy fats. Eat veggie cheese  Eat more home-cooked food and less restaurant, buffet, and fast food. Limit fried foods. Cook foods using methods other than frying. Limit canned vegetables. If you do use them, rinse them well to decrease the sodium. When eating at a restaurant, ask that your food be prepared with less salt, or no salt if possible.                      WHAT FOODS CAN I EAT? Read Dr Fara Olden Fuhrman's books on The End of Dieting & The End of Diabetes  Grains Whole grain or whole wheat bread. Brown rice. Whole grain or whole wheat pasta. Quinoa, bulgur, and whole grain cereals. Low-sodium cereals. Corn or whole wheat flour tortillas. Whole grain cornbread. Whole grain crackers. Low-sodium crackers.  Vegetables Fresh or frozen vegetables (raw, steamed, roasted, or grilled). Low-sodium or reduced-sodium tomato and vegetable juices. Low-sodium or reduced-sodium tomato sauce and paste. Low-sodium or reduced-sodium canned vegetables.   Fruits All fresh, canned (in  natural juice), or frozen fruits.  Protein Products  All fish and seafood.  Dried beans, peas, or lentils. Unsalted nuts and seeds. Unsalted canned beans.  Dairy Low-fat dairy products, such as skim or 1% milk, 2% or reduced-fat cheeses, low-fat ricotta or cottage cheese, or plain low-fat yogurt. Low-sodium or reduced-sodium cheeses.  Fats and Oils Tub margarines without trans fats. Light or reduced-fat mayonnaise and salad dressings (reduced sodium). Avocado. Safflower, olive, or canola oils. Natural peanut or almond butter.  Other Unsalted popcorn and pretzels. The items listed above may not be a complete list of recommended foods or  beverages. Contact your dietitian for more options.  +++++++++++++++  WHAT FOODS ARE NOT RECOMMENDED? Grains/ White flour or wheat flour White bread. White pasta. White rice. Refined cornbread. Bagels and croissants. Crackers that contain trans fat.  Vegetables  Creamed or fried vegetables. Vegetables in a . Regular canned vegetables. Regular canned tomato sauce and paste. Regular tomato and vegetable juices.  Fruits Dried fruits. Canned fruit in light or heavy syrup. Fruit juice.  Meat and Other Protein Products Meat in general - RED meat & White meat.  Fatty cuts of meat. Ribs, chicken wings, all processed meats as bacon, sausage, bologna, salami, fatback, hot dogs, bratwurst and packaged luncheon meats.  Dairy Whole or 2% milk, cream, half-and-half, and cream cheese. Whole-fat or sweetened yogurt. Full-fat cheeses or blue cheese. Non-dairy creamers and whipped toppings. Processed cheese, cheese spreads, or cheese curds.  Condiments Onion and garlic salt, seasoned salt, table salt, and sea salt. Canned and packaged gravies. Worcestershire sauce. Tartar sauce. Barbecue sauce. Teriyaki sauce. Soy sauce, including reduced sodium. Steak sauce. Fish sauce. Oyster sauce. Cocktail sauce. Horseradish. Ketchup and mustard. Meat flavorings and tenderizers.  Bouillon cubes. Hot sauce. Tabasco sauce. Marinades. Taco seasonings. Relishes.  Fats and Oils Butter, stick margarine, lard, shortening and bacon fat. Coconut, palm kernel, or palm oils. Regular salad dressings.  Pickles and olives. Salted popcorn and pretzels.  The items listed above may not be a complete list of foods and beverages to avoid.

## 2022-05-19 ENCOUNTER — Ambulatory Visit: Payer: BC Managed Care – PPO | Admitting: Internal Medicine

## 2022-05-19 ENCOUNTER — Encounter: Payer: Self-pay | Admitting: Internal Medicine

## 2022-05-19 VITALS — BP 138/90 | HR 64 | Temp 97.7°F | Ht 68.0 in | Wt 156.6 lb

## 2022-05-19 DIAGNOSIS — R7309 Other abnormal glucose: Secondary | ICD-10-CM | POA: Diagnosis not present

## 2022-05-19 DIAGNOSIS — E782 Mixed hyperlipidemia: Secondary | ICD-10-CM

## 2022-05-19 DIAGNOSIS — I10 Essential (primary) hypertension: Secondary | ICD-10-CM | POA: Diagnosis not present

## 2022-05-19 DIAGNOSIS — E559 Vitamin D deficiency, unspecified: Secondary | ICD-10-CM

## 2022-05-19 DIAGNOSIS — Z79899 Other long term (current) drug therapy: Secondary | ICD-10-CM | POA: Diagnosis not present

## 2022-05-19 DIAGNOSIS — N528 Other male erectile dysfunction: Secondary | ICD-10-CM

## 2022-05-19 MED ORDER — TADALAFIL 20 MG PO TABS: ORAL_TABLET | ORAL | 2 refills | Status: DC

## 2022-05-20 LAB — CBC WITH DIFFERENTIAL/PLATELET
Absolute Monocytes: 480 cells/uL (ref 200–950)
Basophils Absolute: 80 cells/uL (ref 0–200)
Basophils Relative: 0.8 %
Eosinophils Absolute: 400 cells/uL (ref 15–500)
Eosinophils Relative: 4 %
HCT: 44.7 % (ref 38.5–50.0)
Hemoglobin: 15.3 g/dL (ref 13.2–17.1)
Lymphs Abs: 2070 cells/uL (ref 850–3900)
MCH: 28.9 pg (ref 27.0–33.0)
MCHC: 34.2 g/dL (ref 32.0–36.0)
MCV: 84.5 fL (ref 80.0–100.0)
MPV: 11 fL (ref 7.5–12.5)
Monocytes Relative: 4.8 %
Neutro Abs: 6970 cells/uL (ref 1500–7800)
Neutrophils Relative %: 69.7 %
Platelets: 181 10*3/uL (ref 140–400)
RBC: 5.29 10*6/uL (ref 4.20–5.80)
RDW: 13.5 % (ref 11.0–15.0)
Total Lymphocyte: 20.7 %
WBC: 10 10*3/uL (ref 3.8–10.8)

## 2022-05-20 LAB — HEMOGLOBIN A1C
Hgb A1c MFr Bld: 5.1 % of total Hgb (ref <5.7)
Mean Plasma Glucose: 100 mg/dL
eAG (mmol/L): 5.5 mmol/L

## 2022-05-20 LAB — INSULIN, RANDOM: Insulin: 5.6 u[IU]/mL

## 2022-05-20 LAB — COMPLETE METABOLIC PANEL WITH GFR
AG Ratio: 1.7 (calc) (ref 1.0–2.5)
ALT: 9 U/L (ref 9–46)
AST: 16 U/L (ref 10–35)
Albumin: 4.2 g/dL (ref 3.6–5.1)
Alkaline phosphatase (APISO): 71 U/L (ref 35–144)
BUN: 13 mg/dL (ref 7–25)
CO2: 28 mmol/L (ref 20–32)
Calcium: 9.4 mg/dL (ref 8.6–10.3)
Chloride: 105 mmol/L (ref 98–110)
Creat: 1.21 mg/dL (ref 0.70–1.30)
Globulin: 2.5 g/dL (calc) (ref 1.9–3.7)
Glucose, Bld: 90 mg/dL (ref 65–99)
Potassium: 4.5 mmol/L (ref 3.5–5.3)
Sodium: 140 mmol/L (ref 135–146)
Total Bilirubin: 0.4 mg/dL (ref 0.2–1.2)
Total Protein: 6.7 g/dL (ref 6.1–8.1)
eGFR: 70 mL/min/{1.73_m2} (ref >=60)

## 2022-05-20 LAB — LIPID PANEL
Cholesterol: 193 mg/dL (ref <200)
HDL: 40 mg/dL (ref >=40)
LDL Cholesterol (Calc): 130 mg/dL (calc) — ABNORMAL HIGH
Non-HDL Cholesterol (Calc): 153 mg/dL (calc) — ABNORMAL HIGH (ref <130)
Total CHOL/HDL Ratio: 4.8 (calc) (ref <5.0)
Triglycerides: 123 mg/dL (ref <150)

## 2022-05-20 LAB — TSH: TSH: 0.73 mIU/L (ref 0.40–4.50)

## 2022-05-20 LAB — VITAMIN D 25 HYDROXY (VIT D DEFICIENCY, FRACTURES): Vit D, 25-Hydroxy: 36 ng/mL (ref 30–100)

## 2022-05-20 LAB — MAGNESIUM: Magnesium: 2.1 mg/dL (ref 1.5–2.5)

## 2022-05-20 NOTE — Progress Notes (Signed)
<><><><><><><><><><><><><><><><><><><><><><><><><><><><><><><><><> <><><><><><><><><><><><><><><><><><><><><><><><><><><><><><><><><>   -   Total Chol = 193 is  high risk for Heart Attack /Stroke /Vascular Dementia     ( Ideal or Goal is less than 180 ! )  & - Bad /Dangerous LDL Chol =  130 - - >> Sitting on a time Bomb !     ( Ideal or Goal is less than 70 ! )   - Treating with meds to lower Cholesterol is treating the result                                          & NOT treating the cause - The cause is Bad Diet !  - Read or listen to   Dr Wyman Songster 's book    " How Not to Die ! "   - Recommend a stricter plant based low cholesterol diet  - Cholesterol only comes from animal sources  - ie. meat, dairy, egg yolks - Eat all the vegetables you want. - Avoid meat, especially red meat - Beef AND Pork . - Avoid cheese & dairy - milk & ice cream.   - Cheese is the most concentrated form of trans-fats which  is the worst thing to clog up our arteries.  - Veggie cheese is OK which can be found in the fresh  produce section at [employer] or [employer] or [employer]  <><><><><><><><><><><><><><><><><><><><><><><><><><><><><><><><><> <><><><><><><><><><><><><><><><><><><><><><><><><><><><><><><><><>  -  A1c - Normal - No Diabetes - Great ! <><><><><><><><><><><><><><><><><><><><><><><><><><><><><><><><><>  -  Vitamin D = 36 - Extremely Low  !   - Vitamin D goal is between 70-100.   - Please take  Vitamin D 5,000 units every / day  - It is very important as a natural anti-inflammatory and helping the  immune system protect against viral infections, like the Covid-19    helping hair, skin, and nails, as well as reducing stroke and  heart attack risk.   - It helps your bones and helps with mood.  - It also decreases numerous cancer risks so please  take it as directed.   - Low Vit D is associated  with a 200-300% higher risk for  CANCER   and 200-300% higher risk for HEART   ATTACK  &  STROKE.    - It is also associated with higher death rate at younger ages,   autoimmune diseases like Rheumatoid arthritis, Lupus,  Multiple Sclerosis.     - Also many other serious conditions, like depression,   Alzheimer's Dementia, infertility, muscle aches, fatigue, fibromyalgia   <><><><><><><><><><><><><><><><><><><><><><><><><><><><><><><><><>  -  All Else - CBC - Kidneys - Electrolytes - Liver - Magnesium & Thyroid    - all  Normal / OK  <><><><><><><><><><><><><><><><><><><><><><><><><><><><><><><><><> <><><><><><><><><><><><><><><><><><><><><><><><><><><><><><><><><>

## 2022-06-16 ENCOUNTER — Other Ambulatory Visit: Payer: Self-pay | Admitting: Internal Medicine

## 2022-07-12 ENCOUNTER — Encounter (HOSPITAL_BASED_OUTPATIENT_CLINIC_OR_DEPARTMENT_OTHER): Payer: Self-pay

## 2022-07-12 ENCOUNTER — Emergency Department (HOSPITAL_COMMUNITY): Payer: BC Managed Care – PPO | Admitting: Anesthesiology

## 2022-07-12 ENCOUNTER — Other Ambulatory Visit: Payer: Self-pay

## 2022-07-12 ENCOUNTER — Encounter (HOSPITAL_COMMUNITY)
Admission: EM | Disposition: A | Discharge status: Home / Self Care | Payer: Self-pay | Source: Home / Self Care | Attending: Emergency Medicine

## 2022-07-12 ENCOUNTER — Emergency Department (HOSPITAL_BASED_OUTPATIENT_CLINIC_OR_DEPARTMENT_OTHER): Payer: BC Managed Care – PPO

## 2022-07-12 ENCOUNTER — Emergency Department (HOSPITAL_BASED_OUTPATIENT_CLINIC_OR_DEPARTMENT_OTHER): Payer: BC Managed Care – PPO | Admitting: Radiology

## 2022-07-12 ENCOUNTER — Observation Stay (HOSPITAL_BASED_OUTPATIENT_CLINIC_OR_DEPARTMENT_OTHER)
Admission: EM | Admit: 2022-07-12 | Discharge: 2022-07-13 | Disposition: A | Discharge status: Home / Self Care | Payer: BC Managed Care – PPO | Attending: General Surgery | Admitting: General Surgery

## 2022-07-12 DIAGNOSIS — I10 Essential (primary) hypertension: Secondary | ICD-10-CM | POA: Diagnosis not present

## 2022-07-12 DIAGNOSIS — Z79899 Other long term (current) drug therapy: Secondary | ICD-10-CM | POA: Diagnosis not present

## 2022-07-12 DIAGNOSIS — K35209 Acute appendicitis with generalized peritonitis, without abscess, unspecified as to perforation: Principal | ICD-10-CM | POA: Insufficient documentation

## 2022-07-12 DIAGNOSIS — Z1152 Encounter for screening for COVID-19: Secondary | ICD-10-CM | POA: Insufficient documentation

## 2022-07-12 DIAGNOSIS — R079 Chest pain, unspecified: Secondary | ICD-10-CM | POA: Diagnosis not present

## 2022-07-12 DIAGNOSIS — F17210 Nicotine dependence, cigarettes, uncomplicated: Secondary | ICD-10-CM | POA: Diagnosis not present

## 2022-07-12 DIAGNOSIS — R1013 Epigastric pain: Secondary | ICD-10-CM | POA: Diagnosis not present

## 2022-07-12 DIAGNOSIS — Z7982 Long term (current) use of aspirin: Secondary | ICD-10-CM | POA: Insufficient documentation

## 2022-07-12 DIAGNOSIS — R000 Tachycardia, unspecified: Secondary | ICD-10-CM | POA: Diagnosis not present

## 2022-07-12 DIAGNOSIS — K352 Acute appendicitis with generalized peritonitis, without perforation or abscess: Secondary | ICD-10-CM

## 2022-07-12 DIAGNOSIS — K5730 Diverticulosis of large intestine without perforation or abscess without bleeding: Secondary | ICD-10-CM | POA: Diagnosis not present

## 2022-07-12 DIAGNOSIS — K3533 Acute appendicitis with perforation and localized peritonitis, with abscess: Secondary | ICD-10-CM | POA: Diagnosis not present

## 2022-07-12 DIAGNOSIS — R1110 Vomiting, unspecified: Secondary | ICD-10-CM | POA: Diagnosis not present

## 2022-07-12 DIAGNOSIS — R109 Unspecified abdominal pain: Secondary | ICD-10-CM | POA: Diagnosis not present

## 2022-07-12 DIAGNOSIS — K3580 Unspecified acute appendicitis: Secondary | ICD-10-CM | POA: Diagnosis not present

## 2022-07-12 DIAGNOSIS — J9811 Atelectasis: Secondary | ICD-10-CM | POA: Diagnosis not present

## 2022-07-12 DIAGNOSIS — K353 Acute appendicitis with localized peritonitis, without perforation or gangrene: Secondary | ICD-10-CM | POA: Diagnosis not present

## 2022-07-12 HISTORY — PX: LAPAROSCOPIC APPENDECTOMY: SHX408

## 2022-07-12 LAB — COMPREHENSIVE METABOLIC PANEL
ALT: 13 U/L (ref 0–44)
AST: 15 U/L (ref 15–41)
Albumin: 4.8 g/dL (ref 3.5–5.0)
Alkaline Phosphatase: 87 U/L (ref 38–126)
Anion gap: 12 (ref 5–15)
BUN: 11 mg/dL (ref 6–20)
CO2: 26 mmol/L (ref 22–32)
Calcium: 9.6 mg/dL (ref 8.9–10.3)
Chloride: 99 mmol/L (ref 98–111)
Creatinine, Ser: 1.22 mg/dL (ref 0.61–1.24)
GFR, Estimated: >60 mL/min (ref >60)
Glucose, Bld: 135 mg/dL — ABNORMAL HIGH (ref 70–99)
Potassium: 4 mmol/L (ref 3.5–5.1)
Sodium: 137 mmol/L (ref 135–145)
Total Bilirubin: 0.9 mg/dL (ref 0.3–1.2)
Total Protein: 8 g/dL (ref 6.5–8.1)

## 2022-07-12 LAB — RESP PANEL BY RT-PCR (FLU A&B, COVID) ARPGX2
Influenza A by PCR: NEGATIVE
Influenza B by PCR: NEGATIVE
SARS Coronavirus 2 by RT PCR: NEGATIVE

## 2022-07-12 LAB — CBC
HCT: 48.6 % (ref 39.0–52.0)
Hemoglobin: 17 g/dL (ref 13.0–17.0)
MCH: 29.2 pg (ref 26.0–34.0)
MCHC: 35 g/dL (ref 30.0–36.0)
MCV: 83.4 fL (ref 80.0–100.0)
Platelets: 216 10*3/uL (ref 150–400)
RBC: 5.83 MIL/uL — ABNORMAL HIGH (ref 4.22–5.81)
RDW: 12.9 % (ref 11.5–15.5)
WBC: 15.6 10*3/uL — ABNORMAL HIGH (ref 4.0–10.5)
nRBC: 0 % (ref 0.0–0.2)

## 2022-07-12 LAB — TROPONIN I (HIGH SENSITIVITY)
Troponin I (High Sensitivity): 3 ng/L (ref <18)
Troponin I (High Sensitivity): 6 ng/L (ref <18)

## 2022-07-12 LAB — LACTIC ACID, PLASMA: Lactic Acid, Venous: 1.7 mmol/L (ref 0.5–1.9)

## 2022-07-12 LAB — LIPASE, BLOOD: Lipase: 13 U/L (ref 11–51)

## 2022-07-12 LAB — PROTIME-INR
INR: 1.1 (ref 0.8–1.2)
Prothrombin Time: 14.3 seconds (ref 11.4–15.2)

## 2022-07-12 SURGERY — APPENDECTOMY, LAPAROSCOPIC
Anesthesia: General

## 2022-07-12 MED ORDER — OXYCODONE HCL 5 MG PO TABS: 5.0000 mg | ORAL_TABLET | Freq: Once | ORAL | Status: DC | PRN

## 2022-07-12 MED ORDER — DIPHENHYDRAMINE HCL 50 MG/ML IJ SOLN: 25.0000 mg | Freq: Four times a day (QID) | INTRAMUSCULAR | Status: DC | PRN

## 2022-07-12 MED ORDER — LIDOCAINE 2% (20 MG/ML) 5 ML SYRINGE
INTRAMUSCULAR | Status: AC
  Filled 2022-07-12: qty 5

## 2022-07-12 MED ORDER — FENTANYL CITRATE (PF) 250 MCG/5ML IJ SOLN
INTRAMUSCULAR | Status: DC | PRN
  Administered 2022-07-12: 100 ug via INTRAVENOUS

## 2022-07-12 MED ORDER — DEXAMETHASONE SODIUM PHOSPHATE 10 MG/ML IJ SOLN
INTRAMUSCULAR | Status: DC | PRN
  Administered 2022-07-12: 10 mg via INTRAVENOUS

## 2022-07-12 MED ORDER — ONDANSETRON HCL 4 MG/2ML IJ SOLN
4.0000 mg | Freq: Once | INTRAMUSCULAR | Status: AC
  Administered 2022-07-12: 4 mg via INTRAVENOUS
  Filled 2022-07-12: qty 2

## 2022-07-12 MED ORDER — ACETAMINOPHEN 500 MG PO TABS
1000.0000 mg | ORAL_TABLET | Freq: Four times a day (QID) | ORAL | Status: DC
  Administered 2022-07-12 – 2022-07-13 (×3): 1000 mg via ORAL
  Filled 2022-07-12 (×4): qty 2

## 2022-07-12 MED ORDER — HYDROMORPHONE HCL 1 MG/ML IJ SOLN: 0.5000 mg | INTRAMUSCULAR | Status: DC | PRN

## 2022-07-12 MED ORDER — FENTANYL CITRATE (PF) 250 MCG/5ML IJ SOLN
INTRAMUSCULAR | Status: AC
  Filled 2022-07-12: qty 5

## 2022-07-12 MED ORDER — IOHEXOL 300 MG/ML  SOLN
100.0000 mL | Freq: Once | INTRAMUSCULAR | Status: AC | PRN
  Administered 2022-07-12: 100 mL via INTRAVENOUS

## 2022-07-12 MED ORDER — LIDOCAINE 2% (20 MG/ML) 5 ML SYRINGE
INTRAMUSCULAR | Status: DC | PRN
  Administered 2022-07-12: 60 mg via INTRAVENOUS

## 2022-07-12 MED ORDER — SODIUM CHLORIDE 0.9 % IR SOLN
Status: DC | PRN
  Administered 2022-07-12: 1000 mL

## 2022-07-12 MED ORDER — METOPROLOL TARTRATE 5 MG/5ML IV SOLN: 5.0000 mg | Freq: Four times a day (QID) | INTRAVENOUS | Status: DC | PRN

## 2022-07-12 MED ORDER — ACETAMINOPHEN 500 MG PO TABS: 1000.0000 mg | ORAL_TABLET | Freq: Once | ORAL | Status: DC | PRN

## 2022-07-12 MED ORDER — ONDANSETRON 4 MG PO TBDP: 4.0000 mg | ORAL_TABLET | Freq: Four times a day (QID) | ORAL | Status: DC | PRN

## 2022-07-12 MED ORDER — ALPRAZOLAM 0.25 MG PO TABS
0.5000 mg | ORAL_TABLET | Freq: Three times a day (TID) | ORAL | Status: DC | PRN
  Administered 2022-07-12: 0.5 mg via ORAL
  Filled 2022-07-12: qty 2

## 2022-07-12 MED ORDER — HYDRALAZINE HCL 20 MG/ML IJ SOLN: 10.0000 mg | INTRAMUSCULAR | Status: DC | PRN

## 2022-07-12 MED ORDER — ROCURONIUM BROMIDE 10 MG/ML (PF) SYRINGE
PREFILLED_SYRINGE | INTRAVENOUS | Status: AC
  Filled 2022-07-12: qty 10

## 2022-07-12 MED ORDER — METHOCARBAMOL 500 MG PO TABS: 500.0000 mg | ORAL_TABLET | Freq: Four times a day (QID) | ORAL | Status: DC | PRN

## 2022-07-12 MED ORDER — LOSARTAN POTASSIUM 50 MG PO TABS
100.0000 mg | ORAL_TABLET | Freq: Every day | ORAL | Status: DC
  Administered 2022-07-13: 100 mg via ORAL
  Filled 2022-07-12: qty 2

## 2022-07-12 MED ORDER — ACETAMINOPHEN 10 MG/ML IV SOLN
INTRAVENOUS | Status: DC | PRN
  Administered 2022-07-12: 1000 mg via INTRAVENOUS

## 2022-07-12 MED ORDER — ONDANSETRON HCL 4 MG/2ML IJ SOLN
INTRAMUSCULAR | Status: DC | PRN
  Administered 2022-07-12: 4 mg via INTRAVENOUS

## 2022-07-12 MED ORDER — ROCURONIUM BROMIDE 10 MG/ML (PF) SYRINGE
PREFILLED_SYRINGE | INTRAVENOUS | Status: DC | PRN
  Administered 2022-07-12: 40 mg via INTRAVENOUS

## 2022-07-12 MED ORDER — ACETAMINOPHEN 500 MG PO TABS: 1000.0000 mg | ORAL_TABLET | Freq: Four times a day (QID) | ORAL | Status: DC

## 2022-07-12 MED ORDER — PIPERACILLIN-TAZOBACTAM 3.375 G IVPB
3.3750 g | Freq: Three times a day (TID) | INTRAVENOUS | Status: DC
  Administered 2022-07-12 – 2022-07-13 (×3): 3.375 g via INTRAVENOUS
  Filled 2022-07-12 (×3): qty 50

## 2022-07-12 MED ORDER — LACTATED RINGERS IV SOLN: INTRAVENOUS | Status: DC

## 2022-07-12 MED ORDER — BUPIVACAINE-EPINEPHRINE (PF) 0.25% -1:200000 IJ SOLN
INTRAMUSCULAR | Status: AC
  Filled 2022-07-12: qty 30

## 2022-07-12 MED ORDER — SODIUM CHLORIDE 0.9 % IV BOLUS: 2000.0000 mL | Freq: Once | INTRAVENOUS | Status: DC

## 2022-07-12 MED ORDER — ZOLPIDEM TARTRATE 5 MG PO TABS: 5.0000 mg | ORAL_TABLET | Freq: Every evening | ORAL | Status: DC | PRN

## 2022-07-12 MED ORDER — SUGAMMADEX SODIUM 200 MG/2ML IV SOLN
INTRAVENOUS | Status: DC | PRN
  Administered 2022-07-12 (×2): 100 mg via INTRAVENOUS

## 2022-07-12 MED ORDER — PIPERACILLIN-TAZOBACTAM 3.375 G IVPB: 3.3750 g | Freq: Three times a day (TID) | INTRAVENOUS | Status: DC

## 2022-07-12 MED ORDER — DIPHENHYDRAMINE HCL 25 MG PO CAPS: 25.0000 mg | ORAL_CAPSULE | Freq: Four times a day (QID) | ORAL | Status: DC | PRN

## 2022-07-12 MED ORDER — MIDAZOLAM HCL 2 MG/2ML IJ SOLN
INTRAMUSCULAR | Status: AC
  Filled 2022-07-12: qty 2

## 2022-07-12 MED ORDER — HYDROMORPHONE HCL 1 MG/ML IJ SOLN
0.5000 mg | INTRAMUSCULAR | Status: DC | PRN
  Administered 2022-07-12 – 2022-07-13 (×3): 0.5 mg via INTRAVENOUS
  Filled 2022-07-12 (×3): qty 0.5

## 2022-07-12 MED ORDER — ORAL CARE MOUTH RINSE: 15.0000 mL | Freq: Once | OROMUCOSAL | Status: AC

## 2022-07-12 MED ORDER — SODIUM CHLORIDE 0.9 % IV SOLN: Freq: Once | INTRAVENOUS | Status: AC

## 2022-07-12 MED ORDER — ALUM & MAG HYDROXIDE-SIMETH 200-200-20 MG/5ML PO SUSP
30.0000 mL | Freq: Once | ORAL | Status: AC
  Administered 2022-07-12: 30 mL via ORAL
  Filled 2022-07-12: qty 30

## 2022-07-12 MED ORDER — PHENYLEPHRINE 80 MCG/ML (10ML) SYRINGE FOR IV PUSH (FOR BLOOD PRESSURE SUPPORT)
PREFILLED_SYRINGE | INTRAVENOUS | Status: AC
  Filled 2022-07-12: qty 10

## 2022-07-12 MED ORDER — ACETAMINOPHEN 10 MG/ML IV SOLN
INTRAVENOUS | Status: AC
  Filled 2022-07-12: qty 100

## 2022-07-12 MED ORDER — PROPOFOL 10 MG/ML IV BOLUS
INTRAVENOUS | Status: AC
  Filled 2022-07-12: qty 20

## 2022-07-12 MED ORDER — SIMETHICONE 80 MG PO CHEW
40.0000 mg | CHEWABLE_TABLET | Freq: Four times a day (QID) | ORAL | Status: DC | PRN
  Administered 2022-07-13: 40 mg via ORAL
  Filled 2022-07-12: qty 1

## 2022-07-12 MED ORDER — ONDANSETRON HCL 4 MG/2ML IJ SOLN
INTRAMUSCULAR | Status: AC
  Filled 2022-07-12: qty 2

## 2022-07-12 MED ORDER — SUCCINYLCHOLINE CHLORIDE 200 MG/10ML IV SOSY
PREFILLED_SYRINGE | INTRAVENOUS | Status: AC
  Filled 2022-07-12: qty 10

## 2022-07-12 MED ORDER — ONDANSETRON HCL 4 MG/2ML IJ SOLN: 4.0000 mg | Freq: Four times a day (QID) | INTRAMUSCULAR | Status: DC | PRN

## 2022-07-12 MED ORDER — MIDAZOLAM HCL 2 MG/2ML IJ SOLN
INTRAMUSCULAR | Status: DC | PRN
  Administered 2022-07-12: 2 mg via INTRAVENOUS

## 2022-07-12 MED ORDER — SODIUM CHLORIDE 0.9 % IV SOLN: INTRAVENOUS | Status: DC

## 2022-07-12 MED ORDER — LOSARTAN POTASSIUM 50 MG PO TABS: 100.0000 mg | ORAL_TABLET | Freq: Every day | ORAL | Status: DC

## 2022-07-12 MED ORDER — BISACODYL 10 MG RE SUPP: 10.0000 mg | Freq: Every day | RECTAL | Status: DC | PRN

## 2022-07-12 MED ORDER — BUPIVACAINE-EPINEPHRINE 0.25% -1:200000 IJ SOLN
INTRAMUSCULAR | Status: DC | PRN
  Administered 2022-07-12 (×2): 10 mL

## 2022-07-12 MED ORDER — MORPHINE SULFATE (PF) 4 MG/ML IV SOLN
4.0000 mg | Freq: Once | INTRAVENOUS | Status: AC
  Administered 2022-07-12: 4 mg via INTRAVENOUS
  Filled 2022-07-12: qty 1

## 2022-07-12 MED ORDER — ALPRAZOLAM 0.5 MG PO TABS: 0.5000 mg | ORAL_TABLET | Freq: Three times a day (TID) | ORAL | Status: DC | PRN

## 2022-07-12 MED ORDER — PROPOFOL 10 MG/ML IV BOLUS
INTRAVENOUS | Status: DC | PRN
  Administered 2022-07-12: 150 mg via INTRAVENOUS

## 2022-07-12 MED ORDER — ACETAMINOPHEN 160 MG/5ML PO SOLN: 1000.0000 mg | Freq: Once | ORAL | Status: DC | PRN

## 2022-07-12 MED ORDER — FENTANYL CITRATE (PF) 100 MCG/2ML IJ SOLN: 25.0000 ug | INTRAMUSCULAR | Status: DC | PRN

## 2022-07-12 MED ORDER — KETOROLAC TROMETHAMINE 15 MG/ML IJ SOLN
15.0000 mg | Freq: Four times a day (QID) | INTRAMUSCULAR | Status: DC | PRN
  Administered 2022-07-13: 15 mg via INTRAVENOUS
  Filled 2022-07-12: qty 1

## 2022-07-12 MED ORDER — DOCUSATE SODIUM 100 MG PO CAPS: 100.0000 mg | ORAL_CAPSULE | Freq: Two times a day (BID) | ORAL | Status: DC

## 2022-07-12 MED ORDER — SODIUM CHLORIDE 0.9 % IV SOLN
2.0000 g | Freq: Once | INTRAVENOUS | Status: AC
  Administered 2022-07-12: 2 g via INTRAVENOUS
  Filled 2022-07-12: qty 12.5

## 2022-07-12 MED ORDER — CHLORHEXIDINE GLUCONATE 0.12 % MT SOLN
15.0000 mL | Freq: Once | OROMUCOSAL | Status: AC
  Administered 2022-07-12: 15 mL via OROMUCOSAL
  Filled 2022-07-12: qty 15

## 2022-07-12 MED ORDER — ENOXAPARIN SODIUM 40 MG/0.4ML IJ SOSY: 40.0000 mg | PREFILLED_SYRINGE | INTRAMUSCULAR | Status: DC

## 2022-07-12 MED ORDER — OXYCODONE HCL 5 MG PO TABS
5.0000 mg | ORAL_TABLET | ORAL | Status: DC | PRN
  Administered 2022-07-12 (×2): 10 mg via ORAL
  Filled 2022-07-12 (×2): qty 2

## 2022-07-12 MED ORDER — GABAPENTIN 300 MG PO CAPS
300.0000 mg | ORAL_CAPSULE | Freq: Two times a day (BID) | ORAL | Status: DC
  Administered 2022-07-12 – 2022-07-13 (×2): 300 mg via ORAL
  Filled 2022-07-12 (×2): qty 1

## 2022-07-12 MED ORDER — SUCCINYLCHOLINE CHLORIDE 200 MG/10ML IV SOSY
PREFILLED_SYRINGE | INTRAVENOUS | Status: DC | PRN
  Administered 2022-07-12: 140 mg via INTRAVENOUS

## 2022-07-12 MED ORDER — SODIUM CHLORIDE 0.9 % IV SOLN: INTRAVENOUS | Status: DC | PRN

## 2022-07-12 MED ORDER — DOCUSATE SODIUM 100 MG PO CAPS
100.0000 mg | ORAL_CAPSULE | Freq: Two times a day (BID) | ORAL | Status: DC
  Administered 2022-07-12 – 2022-07-13 (×2): 100 mg via ORAL
  Filled 2022-07-12 (×2): qty 1

## 2022-07-12 MED ORDER — OXYCODONE HCL 5 MG/5ML PO SOLN: 5.0000 mg | Freq: Once | ORAL | Status: DC | PRN

## 2022-07-12 MED ORDER — MORPHINE SULFATE (PF) 2 MG/ML IV SOLN
2.0000 mg | Freq: Once | INTRAVENOUS | Status: AC
  Administered 2022-07-12: 2 mg via INTRAVENOUS
  Filled 2022-07-12: qty 1

## 2022-07-12 MED ORDER — METRONIDAZOLE 500 MG/100ML IV SOLN
500.0000 mg | Freq: Once | INTRAVENOUS | Status: AC
  Administered 2022-07-12: 500 mg via INTRAVENOUS
  Filled 2022-07-12: qty 100

## 2022-07-12 MED ORDER — 0.9 % SODIUM CHLORIDE (POUR BTL) OPTIME
TOPICAL | Status: DC | PRN
  Administered 2022-07-12: 1000 mL

## 2022-07-12 MED ORDER — ENOXAPARIN SODIUM 40 MG/0.4ML IJ SOSY
40.0000 mg | PREFILLED_SYRINGE | INTRAMUSCULAR | Status: DC
  Administered 2022-07-13: 40 mg via SUBCUTANEOUS
  Filled 2022-07-12: qty 0.4

## 2022-07-12 MED ORDER — LIDOCAINE VISCOUS HCL 2 % MT SOLN
15.0000 mL | Freq: Once | OROMUCOSAL | Status: AC
  Administered 2022-07-12: 15 mL via ORAL
  Filled 2022-07-12: qty 15

## 2022-07-12 MED ORDER — DEXAMETHASONE SODIUM PHOSPHATE 10 MG/ML IJ SOLN
INTRAMUSCULAR | Status: AC
  Filled 2022-07-12: qty 1

## 2022-07-12 MED ORDER — ACETAMINOPHEN 10 MG/ML IV SOLN: 1000.0000 mg | Freq: Once | INTRAVENOUS | Status: DC | PRN

## 2022-07-12 MED ORDER — OXYCODONE HCL 5 MG PO TABS: 5.0000 mg | ORAL_TABLET | ORAL | Status: DC | PRN

## 2022-07-12 SURGICAL SUPPLY — 49 items
ADH SKN CLS APL DERMABOND .7 (GAUZE/BANDAGES/DRESSINGS) ×1
APL PRP STRL LF DISP 70% ISPRP (MISCELLANEOUS) ×1
APPLIER CLIP 5 13 M/L LIGAMAX5 (MISCELLANEOUS)
APR CLP MED LRG 5 ANG JAW (MISCELLANEOUS)
BAG COUNTER SPONGE SURGICOUNT (BAG) ×1 IMPLANT
BAG SPEC RTRVL LRG 6X4 10 (ENDOMECHANICALS) ×1
BAG SPNG CNTER NS LX DISP (BAG) ×1
BLADE CLIPPER SURG (BLADE) IMPLANT
CANISTER SUCT 3000ML PPV (MISCELLANEOUS) ×1 IMPLANT
CHLORAPREP W/TINT 26 (MISCELLANEOUS) ×1 IMPLANT
CLIP APPLIE 5 13 M/L LIGAMAX5 (MISCELLANEOUS) IMPLANT
COVER SURGICAL LIGHT HANDLE (MISCELLANEOUS) ×1 IMPLANT
CUTTER FLEX LINEAR 45M (STAPLE) ×1 IMPLANT
DERMABOND ADVANCED .7 DNX12 (GAUZE/BANDAGES/DRESSINGS) ×1 IMPLANT
DRAIN CHANNEL 19F RND (DRAIN) IMPLANT
ELECT REM PT RETURN 9FT ADLT (ELECTROSURGICAL) ×1
ELECTRODE REM PT RTRN 9FT ADLT (ELECTROSURGICAL) ×1 IMPLANT
EVACUATOR SILICONE 100CC (DRAIN) IMPLANT
GLOVE BIO SURGEON STRL SZ 6 (GLOVE) ×1 IMPLANT
GLOVE INDICATOR 6.5 STRL GRN (GLOVE) ×1 IMPLANT
GOWN STRL REUS W/ TWL LRG LVL3 (GOWN DISPOSABLE) ×3 IMPLANT
GOWN STRL REUS W/TWL LRG LVL3 (GOWN DISPOSABLE) ×3
GRASPER SUT TROCAR 14GX15 (MISCELLANEOUS) ×1 IMPLANT
KIT BASIN OR (CUSTOM PROCEDURE TRAY) ×1 IMPLANT
KIT TURNOVER KIT B (KITS) ×1 IMPLANT
NDL INSUFFLATION 14GA 120MM (NEEDLE) ×1 IMPLANT
NEEDLE INSUFFLATION 14GA 120MM (NEEDLE) ×1 IMPLANT
NS IRRIG 1000ML POUR BTL (IV SOLUTION) ×1 IMPLANT
PAD ARMBOARD 7.5X6 YLW CONV (MISCELLANEOUS) ×2 IMPLANT
POUCH SPECIMEN RETRIEVAL 10MM (ENDOMECHANICALS) ×1 IMPLANT
RELOAD 45 VASCULAR/THIN (ENDOMECHANICALS) IMPLANT
RELOAD STAPLE 45 2.5 WHT GRN (ENDOMECHANICALS) IMPLANT
RELOAD STAPLE 45 3.5 BLU ETS (ENDOMECHANICALS) IMPLANT
RELOAD STAPLE TA45 3.5 REG BLU (ENDOMECHANICALS) ×1 IMPLANT
SCISSORS LAP 5X35 DISP (ENDOMECHANICALS) IMPLANT
SET IRRIG TUBING LAPAROSCOPIC (IRRIGATION / IRRIGATOR) ×1 IMPLANT
SET TUBE SMOKE EVAC HIGH FLOW (TUBING) ×1 IMPLANT
SHEARS HARMONIC ACE PLUS 36CM (ENDOMECHANICALS) ×1 IMPLANT
SLEEVE ENDOPATH XCEL 5M (ENDOMECHANICALS) ×1 IMPLANT
SPECIMEN JAR SMALL (MISCELLANEOUS) ×1 IMPLANT
SUT ETHILON 2 0 FS 18 (SUTURE) IMPLANT
SUT MNCRL AB 4-0 PS2 18 (SUTURE) ×1 IMPLANT
TOWEL GREEN STERILE FF (TOWEL DISPOSABLE) ×1 IMPLANT
TRAY FOLEY W/BAG SLVR 16FR (SET/KITS/TRAYS/PACK) ×1
TRAY FOLEY W/BAG SLVR 16FR ST (SET/KITS/TRAYS/PACK) ×1 IMPLANT
TRAY LAPAROSCOPIC MC (CUSTOM PROCEDURE TRAY) ×1 IMPLANT
TROCAR XCEL 12X100 BLDLESS (ENDOMECHANICALS) ×1 IMPLANT
TROCAR Z-THREAD OPTICAL 5X100M (TROCAR) ×1 IMPLANT
WATER STERILE IRR 1000ML POUR (IV SOLUTION) ×1 IMPLANT

## 2022-07-12 NOTE — ED Notes (Signed)
[facility] at [facility] will contact Short Stay for Transport from [facility] to Dr. Phylliss Blakes. ABB (NS) 12:07

## 2022-07-12 NOTE — H&P (Signed)
Surgical Evaluation  Chief Complaint: abdominal pain  HPI: 56 year old male with history of hypertension and hyperlipidemia presents with abdominal pain that began around 7:30 PM yesterday.  This is in the right hemiabdomen.  Associated low-grade fever, vomiting.  He states he had a similar episode a couple months ago and had a negative work-up so was sent home with suspicion of having been gallstone.  No previous abdominal surgery.  Allergies  Allergen Reactions   Lisinopril Cough   Olmesartan Other (See Comments)    Weakness, joint pain   Vicodin [Hydrocodone-Acetaminophen] Other (See Comments)    "makes my skin crawl"    Past Medical History:  Diagnosis Date   Hyperlipidemia    Hypertension    Hypogonadism male    Vitamin D deficiency     Past Surgical History:  Procedure Laterality Date   FINGER SURGERY     ROTATOR CUFF REPAIR      Family History  Problem Relation Age of Onset   Heart attack Mother    Kidney failure Father    Hypertension Brother    Hypothyroidism Brother    Hyperlipidemia Brother     Social History   Socioeconomic History   Marital status: Married    Spouse name: Not on file   Number of children: Not on file   Years of education: Not on file   Highest education level: Not on file  Occupational History   Not on file  Tobacco Use   Smoking status: Every Day    Packs/day: 1.00    Types: Cigarettes   Smokeless tobacco: Never  Substance and Sexual Activity   Alcohol use: Yes    Alcohol/week: 7.0 standard drinks of alcohol    Types: 7 Standard drinks or equivalent per week    Comment: occasional   Drug use: No   Sexual activity: Not on file  Other Topics Concern   Not on file  Social History Narrative   Not on file   Social Determinants of Health   Financial Resource Strain: Not on file  Food Insecurity: Not on file  Transportation Needs: Not on file  Physical Activity: Not on file  Stress: Not on file  Social Connections: Not on  file    No current facility-administered medications on file prior to encounter.   Current Outpatient Medications on File Prior to Encounter  Medication Sig Dispense Refill   ALPRAZolam (XANAX) 1 MG tablet TAKE 1/2 TO 1 TABLET 2-3 TIMES A DAY ONLY IF NEEDED FOR ANXIETY. TRY TO LIMIT TO 5 DAYS/WK. 70 tablet 0   Aspirin-Acetaminophen-Caffeine (GOODY HEADACHE PO) Take 1 Package by mouth 2 (two) times daily as needed (for pain).     CINNAMON PO Take 1 capsule by mouth daily.     Cyanocobalamin (B-12 PO) Take 1 tablet by mouth daily.     diclofenac sodium (VOLTAREN) 1 % GEL Apply 4 g topically 4 (four) times daily. Bilateral shoulders 1 Tube 2   ezetimibe (ZETIA) 10 MG tablet Take  1 tablet  Daily  for Cholesterol 90 tablet 3   losartan (COZAAR) 100 MG tablet TAKE 1 TABLET DAILY FOR BLOOD PRESSURE. 90 tablet 3   sildenafil (VIAGRA) 100 MG tablet TAKE 1/2 TO 1 TABLET AS NEEDED. 30 tablet 0   tadalafil (CIALIS) 20 MG tablet Take 1/2 to 1 tablet every 2 to 3 days as needed for XXXX 30 tablet 2   TURMERIC PO Take 2 tablets by mouth daily. (Patient not taking: Reported on 05/19/2022)  vitamin C (ASCORBIC ACID) 500 MG tablet Take 500 mg by mouth every morning.     VITAMIN D PO Take 1 capsule by mouth. Does not take every day     zinc gluconate 50 MG tablet Take 50 mg by mouth daily.      Review of Systems: a complete, 10pt review of systems was completed with pertinent positives and negatives as documented in the HPI  Physical Exam: Vitals:   07/12/22 1156 07/12/22 1200  BP: 125/82 132/83  Pulse: (!) 121 (!) 122  Resp: (!) 21 (!) 27  Temp:    SpO2: 93% 94%   Gen: A&Ox3, no distress  Eyes: lids and conjunctivae normal, no icterus. Pupils equally round and reactive to light.  Neck: supple without mass or thyromegaly Chest: respiratory effort is normal. No crepitus or tenderness on palpation of the chest. Breath sounds equal.  Cardiovascular: RRR with palpable distal pulses, no pedal  edema Gastrointestinal: soft, nondistended, focally tender in the right and mid abdomen with voluntary guarding. No mass, hepatomegaly or splenomegaly. No hernia. Lymphatic: no lymphadenopathy in the neck or groin Muscoloskeletal: no clubbing or cyanosis of the fingers.  Strength is symmetrical throughout.  Range of motion of bilateral upper and lower extremities normal without pain, crepitation or contracture. Neuro: cranial nerves grossly intact.  Sensation intact to light touch diffusely. Psych: appropriate mood and affect, normal insight/judgment intact  Skin: warm and dry      Latest Ref Rng & Units 07/12/2022    8:51 AM 05/19/2022   12:00 AM 04/21/2022   12:41 PM  CBC  WBC 4.0 - 10.5 K/uL 15.6  10.0  10.2   Hemoglobin 13.0 - 17.0 g/dL 17.0  15.3  14.9   Hematocrit 39.0 - 52.0 % 48.6  44.7  43.1   Platelets 150 - 400 K/uL 216  181  197        Latest Ref Rng & Units 07/12/2022    8:51 AM 05/19/2022   12:00 AM 04/21/2022   12:41 PM  CMP  Glucose 70 - 99 mg/dL 135  90  84   BUN 6 - 20 mg/dL 11  13  11    Creatinine 0.61 - 1.24 mg/dL 1.22  1.21  1.21   Sodium 135 - 145 mmol/L 137  140  140   Potassium 3.5 - 5.1 mmol/L 4.0  4.5  4.3   Chloride 98 - 111 mmol/L 99  105  104   CO2 22 - 32 mmol/L 26  28  30    Calcium 8.9 - 10.3 mg/dL 9.6  9.4  9.3   Total Protein 6.5 - 8.1 g/dL 8.0  6.7  6.7   Total Bilirubin 0.3 - 1.2 mg/dL 0.9  0.4  0.4   Alkaline Phos 38 - 126 U/L 87     AST 15 - 41 U/L 15  16  16    ALT 0 - 44 U/L 13  9  11      Lab Results  Component Value Date   INR 1.1 07/12/2022    Imaging: CT ABDOMEN PELVIS W CONTRAST  Result Date: 07/12/2022 CLINICAL DATA:  Abdominal pain and vomiting since last evening. EXAM: CT ABDOMEN AND PELVIS WITH CONTRAST TECHNIQUE: Multidetector CT imaging of the abdomen and pelvis was performed using the standard protocol following bolus administration of intravenous contrast. RADIATION DOSE REDUCTION: This exam was performed according to  the departmental dose-optimization program which includes automated exposure control, adjustment of the mA and/or kV according to patient  size and/or use of iterative reconstruction technique. CONTRAST:  132mL OMNIPAQUE IOHEXOL 300 MG/ML  SOLN COMPARISON:  None Available. FINDINGS: Lower chest: Streaky bibasilar atelectasis but no infiltrates or effusions. No pulmonary lesions. The heart is normal in size. No pericardial effusion. Hepatobiliary: No focal hepatic lesions or intrahepatic biliary dilatation. The gallbladder is normal. No common bile duct dilatation. Pancreas: Normal Spleen: Normal Adrenals/Urinary Tract: Normal Stomach/Bowel: The stomach, duodenum and small bowel are unremarkable. Scattered colonic diverticulosis but no colonic mass or obstructive findings. The appendix is distended and fluid-filled with wall thickening and mucosal enhancement. Fairly extensive periappendiceal inflammatory changes and a small amount of fluid. Findings consistent with acute appendicitis. No obstructing appendicoliths. No CT findings to suggest perforation. Vascular/Lymphatic: Infrarenal abdominal aortic aneurysm with maximum transverse diameter of 3.5 cm. The aneurysm is approximately 3.8 cm long and ends just above the iliac artery bifurcation. Recommend follow-up ultrasound every 2 years. This recommendation follows ACR consensus guidelines: White Paper of the ACR Incidental Findings Committee II on Vascular Findings. J Am Coll Radiol 2013; 10:789-794. No dissection. Scattered aortic and iliac artery calcifications. Reproductive: The prostate gland and seminal vesicles are unremarkable. Other: No abdominal wall or inguinal hernia. No subcutaneous lesions. Musculoskeletal: No significant bony findings. Advanced degenerative disc disease noted at L4-5 and L5-S1. Large disc extrusion noted at L4-5. IMPRESSION: 1. CT findings consistent with acute appendicitis as detailed above. 2. 3.5 cm infrarenal abdominal aortic  aneurysm. Recommend follow-up ultrasound every 2 years. 3. Aortic atherosclerosis. These results were called by telephone at the time of interpretation on 07/12/2022 at 10:44 am to provider [facility] , who verbally acknowledged these results. Aortic Atherosclerosis (ICD10-I70.0). Electronically Signed   By: Marijo Sanes M.D.   On: 07/12/2022 10:57   DG Chest Port 1 View  Result Date: 07/12/2022 CLINICAL DATA:  Mid abdominal pain beginning last night after eating. Vomiting. Borderline fever. EXAM: PORTABLE CHEST 1 VIEW COMPARISON:  10/26/2018. FINDINGS: Cardiac silhouette is normal in size. No mediastinal or hilar masses. Linear atelectasis extends laterally from the inferior right hilum. Mild interstitial prominence. No evidence of pneumonia or pulmonary edema. No convincing pleural effusion and no pneumothorax. Skeletal structures are grossly intact. IMPRESSION: No acute cardiopulmonary disease. Electronically Signed   By: Lajean Manes M.D.   On: 07/12/2022 10:45     A/P: Acute appendicitis. I recommend proceeding with laparoscopic appendectomy. We discussed the surgery including risks of bleeding, infection, pain, scarring, injury to intra-abdominal structures, conversion to open surgery or more extensive resection, risk of staple line leak or delayed abscess, failure to resolve symptoms, postoperative ileus, incisional hernia, as well as general risks of DVT/PE, pneumonia, stroke, heart attack, death. Questions were welcomed and answered to the patient's satisfaction. We'll proceed to the operating room today.     Patient Active Problem List   Diagnosis Date Noted   Overweight (BMI 25.0-29.9) 07/07/2015   GERD  06/28/2015   Poor compliance 06/19/2014   Abnormal glucose 06/19/2014   Medication management 12/13/2013   Hyperlipidemia, mixed 08/01/2013   Vitamin D deficiency 08/01/2013   Essential hypertension 08/01/2013   Anxiety state 08/01/2013       Romana Juniper,  MD [facility]  See Shea Evans to contact appropriate on-call provider

## 2022-07-12 NOTE — ED Notes (Addendum)
Patient returned from imaging, in obvious distress w/ grimacing/guarding, c/o more abd pain despite recent dose of 4mg  morphine

## 2022-07-12 NOTE — Anesthesia Preprocedure Evaluation (Addendum)
Anesthesia Evaluation  Patient identified by MRN, date of birth, ID band Patient awake    Reviewed: Allergy & Precautions, NPO status , Patient's Chart, lab work & pertinent test results  History of Anesthesia Complications Negative for: history of anesthetic complications  Airway Mallampati: III  TM Distance: >3 FB Neck ROM: Full    Dental  (+) Teeth Intact, Dental Advisory Given, Caps   Pulmonary neg shortness of breath, neg sleep apnea, neg COPD, neg recent URI, Current Smoker and Patient abstained from smoking.   breath sounds clear to auscultation       Cardiovascular hypertension, Pt. on medications (-) angina (-) Past MI and (-) CHF  Rhythm:Regular     Neuro/Psych  PSYCHIATRIC DISORDERS Anxiety     negative neurological ROS     GI/Hepatic Neg liver ROS,GERD  ,,Acute appendicitis   Endo/Other  negative endocrine ROS    Renal/GU negative Renal ROS     Musculoskeletal negative musculoskeletal ROS (+)    Abdominal   Peds  Hematology negative hematology ROS (+) Lab Results      Component                Value               Date                      WBC                      15.6 (H)            07/12/2022                HGB                      17.0                07/12/2022                HCT                      48.6                07/12/2022                MCV                      83.4                07/12/2022                PLT                      216                 07/12/2022              Anesthesia Other Findings   Reproductive/Obstetrics                             Anesthesia Physical Anesthesia Plan  ASA: 2 and emergent  Anesthesia Plan: General   Post-op Pain Management: Toradol IV (intra-op)* and Ofirmev IV (intra-op)*   Induction: Intravenous, Rapid sequence and Cricoid pressure planned  PONV Risk Score and Plan: 1 and Ondansetron and Dexamethasone  Airway  Management Planned: Oral ETT  Additional Equipment: None  Intra-op Plan:  Post-operative Plan: Extubation in OR  Informed Consent: I have reviewed the patients History and Physical, chart, labs and discussed the procedure including the risks, benefits and alternatives for the proposed anesthesia with the patient or authorized representative who has indicated his/her understanding and acceptance.     Dental advisory given  Plan Discussed with: CRNA  Anesthesia Plan Comments:        Anesthesia Quick Evaluation

## 2022-07-12 NOTE — Transfer of Care (Signed)
Immediate Anesthesia Transfer of Care Note  Patient: Grant Campbell  Procedure(s) Performed: APPENDECTOMY LAPAROSCOPIC  Patient Location: PACU  Anesthesia Type:General  Level of Consciousness: awake, alert , and oriented  Airway & Oxygen Therapy: Patient Spontanous Breathing and Patient connected to nasal cannula oxygen  Post-op Assessment: Report given to RN and Post -op Vital signs reviewed and stable  Post vital signs: Reviewed and stable  Last Vitals:  Vitals Value Taken Time  BP 108/64 07/12/22 1525  Temp 37.1 C 07/12/22 1525  Pulse 96 07/12/22 1530  Resp 25 07/12/22 1530  SpO2 95 % 07/12/22 1530  Vitals shown include unvalidated device data.  Last Pain:  Vitals:   07/12/22 1525  TempSrc:   PainSc: Asleep         Complications: No notable events documented.

## 2022-07-12 NOTE — ED Provider Notes (Signed)
[facility] EMERGENCY DEPT Provider Note   CSN: [id number] Arrival date & time: 07/12/22  4196     History  Chief Complaint  Patient presents with   Abdominal Pain    MACAI SISNEROS is a 56 y.o. male with past medical history significant for hypertension, acid reflux, hyperlipidemia who presents with concern for epigastric abdominal pain starting last night around 730.  Patient endorses 2 episodes of emesis, 1 last night, 1 this morning, denies any abnormal quality of emesis.  He denies any diarrhea, constipation.  He reports the pain radiates to his back into the groin.  He denies any dysuria, hematuria, he does endorse some fever, chills.  He denies any previous history of intra-abdominal surgery.  He reports the pain seems somewhat similar to around a month ago, they were suspicious of some kind of a passed stone although right upper quadrant ultrasound did not show any gallbladder dysfunction.   Abdominal Pain      Home Medications Prior to Admission medications   Medication Sig Start Date End Date Taking? Authorizing Provider  ALPRAZolam (XANAX) 1 MG tablet TAKE 1/2 TO 1 TABLET 2-3 TIMES A DAY ONLY IF NEEDED FOR ANXIETY. TRY TO LIMIT TO 5 DAYS/WK. 06/16/22   Raynelle Dick, NP  Aspirin-Acetaminophen-Caffeine (GOODY HEADACHE PO) Take 1 Package by mouth 2 (two) times daily as needed (for pain).    [provider]  CINNAMON PO Take 1 capsule by mouth daily.    [provider]  Cyanocobalamin (B-12 PO) Take 1 tablet by mouth daily.    [provider]  diclofenac sodium (VOLTAREN) 1 % GEL Apply 4 g topically 4 (four) times daily. Bilateral shoulders 02/17/18   Kirtland Bouchard, PA-C  ezetimibe (ZETIA) 10 MG tablet Take  1 tablet  Daily  for Cholesterol 10/25/21   [name], MD  losartan (COZAAR) 100 MG tablet TAKE 1 TABLET DAILY FOR BLOOD PRESSURE. 01/13/22   Raynelle Dick, NP  sildenafil (VIAGRA) 100 MG tablet TAKE 1/2 TO 1 TABLET  AS NEEDED. 03/17/22   Raynelle Dick, NP  tadalafil (CIALIS) 20 MG tablet Take 1/2 to 1 tablet every 2 to 3 days as needed for XXXX 05/19/22   [name], MD  TURMERIC PO Take 2 tablets by mouth daily. Patient not taking: Reported on 05/19/2022    [provider]  vitamin C (ASCORBIC ACID) 500 MG tablet Take 500 mg by mouth every morning.    [provider]  VITAMIN D PO Take 1 capsule by mouth. Does not take every day    [provider]  zinc gluconate 50 MG tablet Take 50 mg by mouth daily.    [provider]      Allergies    Lisinopril, Olmesartan, and Vicodin [hydrocodone-acetaminophen]    Review of Systems   Review of Systems  Gastrointestinal:  Positive for abdominal pain.  All other systems reviewed and are negative.   Physical Exam Updated Vital Signs BP 132/83   Pulse (!) 122   Temp 100.2 F (37.9 C)   Resp (!) 27   Ht 5\' 8"  (1.727 m)   Wt 72.1 kg   SpO2 94%   BMI 24.18 kg/m  Physical Exam Vitals and nursing note reviewed.  Constitutional:      General: He is not in acute distress.    Appearance: Normal appearance.  HENT:     Head: Normocephalic and atraumatic.  Eyes:     General:  Right eye: No discharge.        Left eye: No discharge.  Cardiovascular:     Rate and Rhythm: Regular rhythm. Tachycardia present.     Heart sounds: No murmur heard.    No friction rub. No gallop.  Pulmonary:     Effort: Pulmonary effort is normal.     Breath sounds: Normal breath sounds.  Abdominal:     General: Bowel sounds are normal.     Palpations: Abdomen is soft.     Comments: Patient focally tender in the epigastric region without rebound, rigidity, guarding, normal bowel sounds throughout.  Skin:    General: Skin is warm and dry.     Capillary Refill: Capillary refill takes less than 2 seconds.  Neurological:     Mental Status: He is alert and oriented to person, place, and time.  Psychiatric:        Mood and  Affect: Mood normal.        Behavior: Behavior normal.     ED Results / Procedures / Treatments   Labs (all labs ordered are listed, but only abnormal results are displayed) Labs Reviewed  COMPREHENSIVE METABOLIC PANEL - Abnormal; Notable for the following components:      Result Value   Glucose, Bld 135 (*)    All other components within normal limits  CBC - Abnormal; Notable for the following components:   WBC 15.6 (*)    RBC 5.83 (*)    All other components within normal limits  RESP PANEL BY RT-PCR (FLU A&B, COVID) ARPGX2  CULTURE, BLOOD (ROUTINE X 2)  CULTURE, BLOOD (ROUTINE X 2)  LIPASE, BLOOD  LACTIC ACID, PLASMA  PROTIME-INR  URINALYSIS, ROUTINE W REFLEX MICROSCOPIC  LACTIC ACID, PLASMA  TROPONIN I (HIGH SENSITIVITY)  TROPONIN I (HIGH SENSITIVITY)    EKG EKG Interpretation  Date/Time:  Saturday July 12 2022 08:55:57 EST Ventricular Rate:  116 PR Interval:  143 QRS Duration: 93 QT Interval:  324 QTC Calculation: 451 R Axis:   268 Text Interpretation: Sinus tachycardia Left anterior fascicular block Abnormal R-wave progression, late transition Borderline T wave abnormalities No acute changes No significant change since last tracing Confirmed by Derwood Kaplan [phone]) on 07/12/2022 10:59:06 AM  Radiology CT ABDOMEN PELVIS W CONTRAST  Result Date: 07/12/2022 CLINICAL DATA:  Abdominal pain and vomiting since last evening. EXAM: CT ABDOMEN AND PELVIS WITH CONTRAST TECHNIQUE: Multidetector CT imaging of the abdomen and pelvis was performed using the standard protocol following bolus administration of intravenous contrast. RADIATION DOSE REDUCTION: This exam was performed according to the departmental dose-optimization program which includes automated exposure control, adjustment of the mA and/or kV according to patient size and/or use of iterative reconstruction technique. CONTRAST:  OMNIPAQUE IOHEXOL 300 MG/ML  SOLN COMPARISON:  None Available. FINDINGS:  Lower chest: Streaky bibasilar atelectasis but no infiltrates or effusions. No pulmonary lesions. The heart is normal in size. No pericardial effusion. Hepatobiliary: No focal hepatic lesions or intrahepatic biliary dilatation. The gallbladder is normal. No common bile duct dilatation. Pancreas: Normal Spleen: Normal Adrenals/Urinary Tract: Normal Stomach/Bowel: The stomach, duodenum and small bowel are unremarkable. Scattered colonic diverticulosis but no colonic mass or obstructive findings. The appendix is distended and fluid-filled with wall thickening and mucosal enhancement. Fairly extensive periappendiceal inflammatory changes and a small amount of fluid. Findings consistent with acute appendicitis. No obstructing appendicoliths. No CT findings to suggest perforation. Vascular/Lymphatic: Infrarenal abdominal aortic aneurysm with maximum transverse diameter of 3.5 cm. The aneurysm is approximately 3.8  cm long and ends just above the iliac artery bifurcation. Recommend follow-up ultrasound every 2 years. This recommendation follows ACR consensus guidelines: White Paper of the ACR Incidental Findings Committee II on Vascular Findings. J Am Coll Radiol 2013; 10:789-794. No dissection. Scattered aortic and iliac artery calcifications. Reproductive: The prostate gland and seminal vesicles are unremarkable. Other: No abdominal wall or inguinal hernia. No subcutaneous lesions. Musculoskeletal: No significant bony findings. Advanced degenerative disc disease noted at L4-5 and L5-S1. Large disc extrusion noted at L4-5. IMPRESSION: 1. CT findings consistent with acute appendicitis as detailed above. 2. 3.5 cm infrarenal abdominal aortic aneurysm. Recommend follow-up ultrasound every 2 years. 3. Aortic atherosclerosis. These results were called by telephone at the time of interpretation on 07/12/2022 at 10:44 am to provider [facility] , who verbally acknowledged these results. Aortic Atherosclerosis  (ICD10-I70.0). Electronically Signed   By: Rudie Meyer M.D.   On: 07/12/2022 10:57   DG Chest Port 1 View  Result Date: 07/12/2022 CLINICAL DATA:  Mid abdominal pain beginning last night after eating. Vomiting. Borderline fever. EXAM: PORTABLE CHEST 1 VIEW COMPARISON:  10/26/2018. FINDINGS: Cardiac silhouette is normal in size. No mediastinal or hilar masses. Linear atelectasis extends laterally from the inferior right hilum. Mild interstitial prominence. No evidence of pneumonia or pulmonary edema. No convincing pleural effusion and no pneumothorax. Skeletal structures are grossly intact. IMPRESSION: No acute cardiopulmonary disease. Electronically Signed   By: Amie Portland M.D.   On: 07/12/2022 10:45    Procedures Procedures    Medications Ordered in ED Medications  ceFEPIme (MAXIPIME) 2 g in sodium chloride 0.9 % 100 mL IVPB (0 g Intravenous Stopped 07/12/22 1156)    And  metroNIDAZOLE (FLAGYL) IVPB 500 mg (500 mg Intravenous New Bag/Given 07/12/22 1212)  sodium chloride 0.9 % bolus 2,000 mL (has no administration in time range)  0.9 %  sodium chloride infusion ( Intravenous New Bag/Given 07/12/22 0858)  morphine (PF) 2 MG/ML injection 2 mg (2 mg Intravenous Given 07/12/22 0922)  ondansetron (ZOFRAN) injection 4 mg (4 mg Intravenous Given 07/12/22 0920)  alum & mag hydroxide-simeth (MAALOX/MYLANTA) 200-200-20 MG/5ML suspension 30 mL (30 mLs Oral Given 07/12/22 0920)    And  lidocaine (XYLOCAINE) 2 % viscous mouth solution 15 mL (15 mLs Oral Given 07/12/22 0920)  morphine (PF) 4 MG/ML injection 4 mg (4 mg Intravenous Given 07/12/22 1017)  iohexol (OMNIPAQUE) 300 MG/ML solution 100 mL (100 mLs Intravenous Contrast Given 07/12/22 1028)    ED Course/ Medical Decision Making/ A&P Clinical Course as of 07/12/22 1253  Sat Jul 12, 2022  1105 On reexamination patient with right-sided abdominal pain, radiation to his epigastric region without generalized peritonitis, he is not with  significant focal tenderness on the left, no rigidity, no rebound. [CP]  1153 Patient has generalized peritonitis on my exam.  Cefepime and Flagyl ordered.  Clinically not septic, but he has extensive edema in the right lower quadrant and shock index is close to 1.  We will order IV fluids.  IV cefepime and Flagyl ordered.  Discussed case with general surgery.  They will take the patient to the OR later today.  I just received a message from surgery team, patient will be transferred to [facility] short stay, Dr. Doylene Canard will be the surgeon. [AN]    Clinical Course User Index [AN] Derwood Kaplan, MD [CP] Olene Floss, PA-C  Medical Decision Making Amount and/or Complexity of Data Reviewed Labs: ordered. Radiology: ordered.  Risk Prescription drug management.   This patient is a 56 y.o. male who presents to the ED for concern of epigastric to RLQ abdominal pain, this involves an extensive number of treatment options, and is a complaint that carries with it a high risk of complications and morbidity. The emergent differential diagnosis prior to evaluation includes, but is not limited to,  esophagitis, gastritis, peptic ulcer disease, esophageal rupture, gastric rupture, Boerhaave's, Mallory-Weiss, pancreatitis, cholecystitis, cholangitis, acute mesenteric ischemia, appendicitis, right-sided diverticulitis, atypical chest pain or ACS, lower lobar pneumonia versus other.   This is not an exhaustive differential.   Past Medical History / Co-morbidities / Social History: Hyperlipidemia, hypertension, no previous intra-abdominal surgery  Additional history: Chart reviewed. Pertinent results include: Reviewed previous visits with patient's primary care doctor  Physical Exam: Physical exam performed. The pertinent findings include: Patient with some tenderness focally in the epigastric to right lower quadrant region, with no rebound, rigidity, guarding, pain is  somewhat generalized on repeat exam raising concern for developing peritonitis.  No abdominal distention noted.  Patient arrives nearly febrile with temperature of 100.2, and tachycardic with a pulse of 125.  Oxygen saturation stable on room air.  Lab Tests: I ordered, and personally interpreted labs.  The pertinent results include:  cbc with leukocytosis of 15.6, hemoglobin high normal suggesting mild dehydration, cmp unremarkable, initial lactic acid normal, rvp negative, normal lipase, troponin x 1.   Imaging Studies: I ordered imaging studies including ct abdomen pelvis. I independently visualized and interpreted imaging which showed evidence of acute appendicitis with significant stranding, no evidence of clear perforation. I agree with the radiologist interpretation.   Cardiac Monitoring:  The patient was maintained on a cardiac monitor.  My attending physician Dr. Rhunette CroftNanavati viewed and interpreted the cardiac monitored which showed an underlying rhythm of: sinus tachycardia. I agree with this interpretation.   Medications: I ordered medication including cefepime and Flagyl for intra-abdominal infection, morphine, Zofran, fluid bolus for tachycardia, pain, nausea.  Patient with improvement of his pain, however will need  Consultations Obtained: I requested consultation with the general surgeon, spoke with PA working with Dr. Carola FrostHandy and discussed lab and imaging findings as well as pertinent plan - they recommend: surgery for appendicitis, patient will be transferred to pre-op holding area for procedure and admitted thereafter onto surgical service.   Disposition: After consideration of the diagnostic results and the patients response to treatment, I feel that patient would benefit from admission for surgery as discussed above .   I discussed this case with my attending physician Dr. Rhunette CroftNanavati who cosigned this note including patient's presenting symptoms, physical exam, and planned diagnostics  and interventions. Attending physician stated agreement with plan or made changes to plan which were implemented.    Final Clinical Impression(s) / ED Diagnoses Final diagnoses:  Acute appendicitis with generalized peritonitis without gangrene, perforation, or abscess    Rx / DC Orders ED Discharge Orders     None         Olene Flossrosperi, Geniva Lohnes H, PA-C 07/12/22 1253    Derwood KaplanNanavati, Ankit, MD 07/13/22 1402

## 2022-07-12 NOTE — ED Triage Notes (Signed)
Pt presents POV from home w/spouse, BIB WC, CA/Ox4  Pt c/o mid abd pain starting last night after eating soup, approx 1930. Pt vomited after eating, temp 99.5 last night, 100.3 at home this am.   Pt seen here for the same 2 months ago.

## 2022-07-12 NOTE — ED Notes (Signed)
Lab called, will add on Troponin

## 2022-07-12 NOTE — Anesthesia Procedure Notes (Signed)
Procedure Name: Intubation Date/Time: 07/12/2022 2:23 PM  Performed by: Darletta Moll, CRNAPre-anesthesia Checklist: Patient identified, Emergency Drugs available, Suction available and Patient being monitored Patient Re-evaluated:Patient Re-evaluated prior to induction Oxygen Delivery Method: Circle system utilized Preoxygenation: Pre-oxygenation with 100% oxygen Induction Type: IV induction, Rapid sequence and Cricoid Pressure applied Ventilation: Mask ventilation without difficulty Laryngoscope Size: Mac and 4 Grade View: Grade I Tube type: Oral Tube size: 7.5 mm Number of attempts: 1 Airway Equipment and Method: Stylet Placement Confirmation: ETT inserted through vocal cords under direct vision, positive ETCO2 and breath sounds checked- equal and bilateral Secured at: 22 cm Tube secured with: Tape Dental Injury: Teeth and Oropharynx as per pre-operative assessment

## 2022-07-12 NOTE — Op Note (Signed)
Operative Report  Grant Campbell 56 y.o. male  [id number]  [id number]  07/12/2022  Surgeon: Phylliss Blakes MD   Procedure performed: Laparoscopic Appendectomy   Preop diagnosis: Acute appendicitis   Post-op diagnosis/intraop findings: Acute appendicitis diffuse purulent peritonitis   Specimens: appendix   EBL: minimal   Complications: none   Description of procedure: After obtaining informed consent the patient was brought to the operating room. Antibiotics were administered. SCD's were applied. General endotracheal anesthesia was initiated and a formal time-out was performed. Foley catheter was inserted which is removed at the end of the case. The abdomen was prepped and draped in the usual sterile fashion and the abdomen was entered using an infraumbilical Veress needle and insufflated to 15 mmHg. A 5 mm trocar and camera were then introduced, the abdomen was inspected and there is no evidence of injury from our entry.  There was however, puss interpsersed between multiple small bowel loops and in the pelvis, and injected appearance of the small bowel serosa and abdominal wall consistent with severe purulent peritonitis.  A suprapubic 5 mm trocar and a left lower quadrant 12 mm trocar were introduced under direct visualization following infiltration with local. The patient was then placed in Trendelenburg and rotated to the left and the small bowel was reflected cephalad. The appendix is found to be acutely inflamed with purulent exudate, and retrocecal.  There was gangrenous change at the base of the appendix. A combination of blunt dissection and harmonic scalpel were used to free it of its retroperitoneal attachments. Great care was taken to ensure no injury to surrounding retroperitoneal structures, cecum or terminal ileum. The mesoappendix was divided with the harmonic scalpel, isolating the base of the appendix. Hemostasis was ensured. A blue load 62mm endoGIA stapler was used to transect  the appendix from the cecum.  As the stapler closed, the gangrenous segment of the base did avulse.  The stapler was fired and the staple line inspected.  This did appear to be intact and hemostatic.  This was checked again at the end of the case and some pressure applied against the cecal contents, and it did appear to be well intact with no evident leak.  The ileal sail was placed over the staple line. The appendix was placed in an Endo Catch bag and removed through our 12 mm trocar site. The staple line was reinspected and confirmed to be intact, hemostatic, and viable. The small bowel was run several feet from the ilececal valve proximally with no other abnormalities identified.  The purulent fluid was evacuated as much as possible and the abdomen was irrigated with warm sterile saline.  A 19 French round Blake drain was placed through the 12 mm trocar site and directed into the pelvis as well as along the right paracolic gutter, but not directly contacting the staple line.  This was secured to the skin with a 2-0 nylon.  The abdomen was desufflated and all trocars removed. The skin incisions were closed with subcuticular 4-0 monocryl and Dermabond. The patient was awakened, extubated and transported to the recovery room in stable condition.    All counts were correct at the completion of the case.

## 2022-07-13 LAB — CBC
HCT: 36.6 % — ABNORMAL LOW (ref 39.0–52.0)
Hemoglobin: 13 g/dL (ref 13.0–17.0)
MCH: 29.5 pg (ref 26.0–34.0)
MCHC: 35.5 g/dL (ref 30.0–36.0)
MCV: 83 fL (ref 80.0–100.0)
Platelets: 136 10*3/uL — ABNORMAL LOW (ref 150–400)
RBC: 4.41 MIL/uL (ref 4.22–5.81)
RDW: 12.9 % (ref 11.5–15.5)
WBC: 11.5 10*3/uL — ABNORMAL HIGH (ref 4.0–10.5)
nRBC: 0 % (ref 0.0–0.2)

## 2022-07-13 LAB — BASIC METABOLIC PANEL
Anion gap: 6 (ref 5–15)
BUN: 11 mg/dL (ref 6–20)
CO2: 24 mmol/L (ref 22–32)
Calcium: 7.9 mg/dL — ABNORMAL LOW (ref 8.9–10.3)
Chloride: 107 mmol/L (ref 98–111)
Creatinine, Ser: 1.23 mg/dL (ref 0.61–1.24)
GFR, Estimated: >60 mL/min (ref >60)
Glucose, Bld: 136 mg/dL — ABNORMAL HIGH (ref 70–99)
Potassium: 4 mmol/L (ref 3.5–5.1)
Sodium: 137 mmol/L (ref 135–145)

## 2022-07-13 LAB — MAGNESIUM: Magnesium: 2.2 mg/dL (ref 1.7–2.4)

## 2022-07-13 MED ORDER — AMOXICILLIN-POT CLAVULANATE 875-125 MG PO TABS: 1.0000 | ORAL_TABLET | Freq: Two times a day (BID) | ORAL | 0 refills | Status: DC

## 2022-07-13 MED ORDER — OXYCODONE HCL 5 MG PO TABS: 5.0000 mg | ORAL_TABLET | Freq: Four times a day (QID) | ORAL | 0 refills | Status: DC | PRN

## 2022-07-13 NOTE — Progress Notes (Signed)
1 Day Post-Op   Subjective/Chief Complaint: Complains of soreness but otherwise ok. Hasn't eaten anything but ice chips   Objective: Vital signs in last 24 hours: Temp:  [97.5 F (36.4 C)-99.3 F (37.4 C)] 97.9 F (36.6 C) (11/12 0720) Pulse Rate:  [61-122] 61 (11/12 0720) Resp:  [10-27] 17 (11/12 0720) BP: (96-132)/(62-83) 101/62 (11/12 0720) SpO2:  [93 %-99 %] 99 % (11/12 0720) Last BM Date : 07/11/22  Intake/Output from previous day: 11/11 0701 - 11/12 0700 In: 3391.8 [I.V.:3045.4; IV Piggyback:346.4] Out: 265 [Urine:100; Drains:150; Blood:15] Intake/Output this shift: No intake/output data recorded.  General appearance: alert and cooperative Resp: clear to auscultation bilaterally Cardio: regular rate and rhythm GI: soft, appropriately tender. Good bs  Lab Results:  Recent Labs    07/12/22 0851 07/13/22 0124  WBC 15.6* 11.5*  HGB 17.0 13.0  HCT 48.6 36.6*  PLT 216 136*   BMET Recent Labs    07/12/22 0851 07/13/22 0124  NA 137 137  K 4.0 4.0  CL 99 107  CO2 26 24  GLUCOSE 135* 136*  BUN 11 11  CREATININE 1.22 1.23  CALCIUM 9.6 7.9*   PT/INR Recent Labs    07/12/22 0851  LABPROT 14.3  INR 1.1   ABG No results for input(s): "PHART", "HCO3" in the last 72 hours.  Invalid input(s): "PCO2", "PO2"  Studies/Results: CT ABDOMEN PELVIS W CONTRAST  Result Date: 07/12/2022 CLINICAL DATA:  Abdominal pain and vomiting since last evening. EXAM: CT ABDOMEN AND PELVIS WITH CONTRAST TECHNIQUE: Multidetector CT imaging of the abdomen and pelvis was performed using the standard protocol following bolus administration of intravenous contrast. RADIATION DOSE REDUCTION: This exam was performed according to the departmental dose-optimization program which includes automated exposure control, adjustment of the mA and/or kV according to patient size and/or use of iterative reconstruction technique. CONTRAST:  OMNIPAQUE IOHEXOL 300 MG/ML  SOLN COMPARISON:  None  Available. FINDINGS: Lower chest: Streaky bibasilar atelectasis but no infiltrates or effusions. No pulmonary lesions. The heart is normal in size. No pericardial effusion. Hepatobiliary: No focal hepatic lesions or intrahepatic biliary dilatation. The gallbladder is normal. No common bile duct dilatation. Pancreas: Normal Spleen: Normal Adrenals/Urinary Tract: Normal Stomach/Bowel: The stomach, duodenum and small bowel are unremarkable. Scattered colonic diverticulosis but no colonic mass or obstructive findings. The appendix is distended and fluid-filled with wall thickening and mucosal enhancement. Fairly extensive periappendiceal inflammatory changes and a small amount of fluid. Findings consistent with acute appendicitis. No obstructing appendicoliths. No CT findings to suggest perforation. Vascular/Lymphatic: Infrarenal abdominal aortic aneurysm with maximum transverse diameter of 3.5 cm. The aneurysm is approximately 3.8 cm long and ends just above the iliac artery bifurcation. Recommend follow-up ultrasound every 2 years. This recommendation follows ACR consensus guidelines: White Paper of the ACR Incidental Findings Committee II on Vascular Findings. J Am Coll Radiol 2013; 10:789-794. No dissection. Scattered aortic and iliac artery calcifications. Reproductive: The prostate gland and seminal vesicles are unremarkable. Other: No abdominal wall or inguinal hernia. No subcutaneous lesions. Musculoskeletal: No significant bony findings. Advanced degenerative disc disease noted at L4-5 and L5-S1. Large disc extrusion noted at L4-5. IMPRESSION: 1. CT findings consistent with acute appendicitis as detailed above. 2. 3.5 cm infrarenal abdominal aortic aneurysm. Recommend follow-up ultrasound every 2 years. 3. Aortic atherosclerosis. These results were called by telephone at the time of interpretation on 07/12/2022 at 10:44 am to provider [facility] , who verbally acknowledged these results. Aortic  Atherosclerosis (ICD10-I70.0). Electronically Signed   By: Demetrius Charity.  Gallerani M.D.   On: 07/12/2022 10:57   DG Chest Port 1 View  Result Date: 07/12/2022 CLINICAL DATA:  Mid abdominal pain beginning last night after eating. Vomiting. Borderline fever. EXAM: PORTABLE CHEST 1 VIEW COMPARISON:  10/26/2018. FINDINGS: Cardiac silhouette is normal in size. No mediastinal or hilar masses. Linear atelectasis extends laterally from the inferior right hilum. Mild interstitial prominence. No evidence of pneumonia or pulmonary edema. No convincing pleural effusion and no pneumothorax. Skeletal structures are grossly intact. IMPRESSION: No acute cardiopulmonary disease. Electronically Signed   By: Amie Portland M.D.   On: 07/12/2022 10:45    Anti-infectives: Anti-infectives (From admission, onward)    Start     Dose/Rate Route Frequency Ordered Stop   07/12/22 1830  piperacillin-tazobactam (ZOSYN) IVPB 3.375 g        3.375 g 12.5 mL/hr over 240 Minutes Intravenous Every 8 hours 07/12/22 1741 07/17/22 2159   07/12/22 1530  piperacillin-tazobactam (ZOSYN) IVPB 3.375 g  Status:  Discontinued        3.375 g 12.5 mL/hr over 240 Minutes Intravenous Every 8 hours 07/12/22 1528 07/12/22 1639   07/12/22 1100  ceFEPIme (MAXIPIME) 2 g in sodium chloride 0.9 % 100 mL IVPB       See Hyperspace for full Linked Orders Report.   2 g 200 mL/hr over 30 Minutes Intravenous  Once 07/12/22 1053 07/12/22 1156   07/12/22 1100  metroNIDAZOLE (FLAGYL) IVPB 500 mg       See Hyperspace for full Linked Orders Report.   500 mg 100 mL/hr over 60 Minutes Intravenous  Once 07/12/22 1053 07/12/22 1743       Assessment/Plan: s/p Procedure(s): APPENDECTOMY LAPAROSCOPIC (N/A) Advance diet If he tolerates this then could plan for d/c later today. He will need to go home with drain and oral abx  LOS: 0 days    Chevis Pretty III 07/13/2022

## 2022-07-13 NOTE — Anesthesia Postprocedure Evaluation (Signed)
Anesthesia Post Note  Patient: Grant Campbell  Procedure(s) Performed: APPENDECTOMY LAPAROSCOPIC     Patient location during evaluation: PACU Anesthesia Type: General Level of consciousness: awake and alert Pain management: pain level controlled Vital Signs Assessment: post-procedure vital signs reviewed and stable Respiratory status: spontaneous breathing, nonlabored ventilation and respiratory function stable Cardiovascular status: blood pressure returned to baseline and stable Postop Assessment: no apparent nausea or vomiting Anesthetic complications: no   No notable events documented.  Last Vitals:  Vitals:   07/12/22 2355 07/13/22 0530  BP: 104/73 96/77  Pulse: 66 77  Resp: 17 18  Temp: 36.6 C (!) 36.4 C  SpO2: 96% 97%    Last Pain:  Vitals:   07/13/22 0530  TempSrc: Oral  PainSc:                  Arleta Ostrum

## 2022-07-14 ENCOUNTER — Encounter (HOSPITAL_COMMUNITY): Payer: Self-pay | Admitting: Surgery

## 2022-07-15 LAB — SURGICAL PATHOLOGY

## 2022-07-18 LAB — CULTURE, BLOOD (ROUTINE X 2)
Culture: NO GROWTH
Special Requests: ADEQUATE

## 2022-07-21 ENCOUNTER — Encounter (HOSPITAL_COMMUNITY): Payer: Self-pay | Admitting: Surgery

## 2022-07-29 NOTE — Discharge Summary (Signed)
[facility] Discharge Summary   Patient ID: HESTER FORGET MRN: [id number] DOB/AGE: 03/25/1966 56 y.o.  Admit date: 07/12/2022 Discharge date: 07/29/2022  Admitting Diagnosis: Appendicitis   Discharge Diagnosis Patient Active Problem List   Diagnosis Date Noted   Acute appendicitis 07/12/2022   Overweight (BMI 25.0-29.9) 07/07/2015   GERD  06/28/2015   Poor compliance 06/19/2014   Abnormal glucose 06/19/2014   Medication management 12/13/2013   Hyperlipidemia, mixed 08/01/2013   Vitamin D deficiency 08/01/2013   Essential hypertension 08/01/2013   Anxiety state 08/01/2013    Consultants None  Imaging: No results found.  Procedures Dr. Fredricka Bonine, 07/12/22 - laparoscopic appendectomy  Hospital Course:  56 y/o M who presented to [facility] with abdominal pain.  Workup showed appendicitis.  Patient was admitted and underwent procedure listed above where appendix was inflammed and purulent peritonitis was encountered.  Tolerated procedure well and was transferred to the floor.  Diet was advanced as tolerated.  On POD#1, the patient was voiding well, tolerating diet, ambulating well, pain well controlled, vital signs stable, incisions c/d/i and felt stable for discharge home.  Patient will follow up in our office in 2 weeks and knows to call with questions or concerns.    I did not personally evaluate the patient on the day of discharge, the above information was obtained entirely from chart review.   Allergies as of 07/13/2022       Reactions   Lisinopril Cough   Olmesartan Other (See Comments)   Weakness, joint pain   Vicodin [hydrocodone-acetaminophen] Other (See Comments)   "makes my skin crawl"        Medication List     TAKE these medications    ALPRAZolam 1 MG tablet Commonly known as: XANAX TAKE 1/2 TO 1 TABLET 2-3 TIMES A DAY ONLY IF NEEDED FOR ANXIETY. TRY TO LIMIT TO 5 DAYS/WK. What changed: See the new instructions.   amoxicillin-clavulanate  875-125 MG tablet Commonly known as: AUGMENTIN Take 1 tablet by mouth 2 (two) times daily.   B-12 PO Take 1 tablet by mouth daily.   CINNAMON PO Take 1 capsule by mouth daily.   diclofenac sodium 1 % Gel Commonly known as: Voltaren Apply 4 g topically 4 (four) times daily. Bilateral shoulders What changed:  when to take this reasons to take this additional instructions   ezetimibe 10 MG tablet Commonly known as: Zetia Take  1 tablet  Daily  for Cholesterol What changed:  how much to take how to take this when to take this additional instructions   GOODY HEADACHE PO Take 1 Package by mouth 2 (two) times daily as needed (for pain).   losartan 100 MG tablet Commonly known as: COZAAR TAKE 1 TABLET DAILY FOR BLOOD PRESSURE. What changed: additional instructions   MAGNESIUM PO Take 1 capsule by mouth daily.   naproxen sodium 220 MG tablet Commonly known as: ALEVE Take 440 mg by mouth daily as needed (pain relief).   oxyCODONE 5 MG immediate release tablet Commonly known as: Oxy IR/ROXICODONE Take 1-2 tablets (5-10 mg total) by mouth every 6 (six) hours as needed for moderate pain.   sildenafil 100 MG tablet Commonly known as: VIAGRA TAKE 1/2 TO 1 TABLET AS NEEDED. What changed: See the new instructions.   tadalafil 20 MG tablet Commonly known as: Cialis Take 1/2 to 1 tablet every 2 to 3 days as needed for XXXX   VITAMIN D PO Take 1 capsule by mouth. Does not take every day  Follow-up Information     [facility] Follow up in 1 week(s).   Specialty: General Surgery Why: drain removal Contact information: [address] [place] [address] [phone]                Signed: Hosie Spangle, [facility] 07/29/2022, 2:23 PM

## 2022-08-18 ENCOUNTER — Other Ambulatory Visit: Payer: Self-pay | Admitting: Internal Medicine

## 2022-08-18 MED ORDER — ALPRAZOLAM 1 MG PO TABS: ORAL_TABLET | ORAL | 0 refills | Status: DC

## 2022-08-27 ENCOUNTER — Ambulatory Visit: Payer: BC Managed Care – PPO | Admitting: Internal Medicine

## 2022-08-27 ENCOUNTER — Encounter: Payer: Self-pay | Admitting: Internal Medicine

## 2022-08-27 VITALS — BP 136/82 | HR 81 | Temp 97.9°F | Resp 16 | Ht 68.0 in | Wt 169.4 lb

## 2022-08-27 DIAGNOSIS — E349 Endocrine disorder, unspecified: Secondary | ICD-10-CM

## 2022-08-27 DIAGNOSIS — Z79899 Other long term (current) drug therapy: Secondary | ICD-10-CM | POA: Diagnosis not present

## 2022-08-27 DIAGNOSIS — R5383 Other fatigue: Secondary | ICD-10-CM

## 2022-08-27 NOTE — Progress Notes (Signed)
     Future Appointments  Date Time Provider Department  08/27/2022  2:30 PM [name], MD [facility]  10/30/2022 10:00 AM [name], MD [facility]    History of Present Illness:       This 56 y.o.  MWM  with HTN, HLD, Prediabetes and Vitamin D Deficiency  presents with concerns re his testosterone level.    In 2015, his testosterone level was 417 - Normal & have been normal every year since with last Testosterone level = 719 - high Normal in Feb 2023. He requests recheck of Testosterone level for confirmation  dure to fatigue.  Medications   Current Outpatient Medications (Cardiovascular):    ezetimibe (ZETIA) 10 MG tablet, Take  1 tablet  Daily  for Cholesterol (Patient taking differently: Take 10 mg by mouth daily.)   losartan (COZAAR) 100 MG tablet, TAKE 1 TABLET DAILY FOR BLOOD PRESSURE. (Patient taking differently: Take 100 mg by mouth daily.)   sildenafil (VIAGRA) 100 MG tablet, TAKE 1/2 TO 1 TABLET AS NEEDED. (Patient taking differently: Take 50-100 mg by mouth as needed for erectile dysfunction.)   Current Outpatient Medications (Analgesics):    Aspirin-Acetaminophen-Caffeine (GOODY HEADACHE PO), Take 1 Package by mouth 2 (two) times daily as needed (for pain).   naproxen sodium (ALEVE) 220 MG tablet, Take 440 mg by mouth daily as needed (pain relief).  Current Outpatient Medications (Hematological):    Cyanocobalamin (B-12 PO), Take 1 tablet by mouth daily.  Current Outpatient Medications (Other):    ALPRAZolam (XANAX) 1 MG tablet, Take 1/2 - 1 tablet 2 - 3 x /day ONLY if needed for Anxiety Attack &  limit to 5 days /week to avoid Addiction & Dementia   CINNAMON PO, Take 1 capsule by mouth daily.   diclofenac sodium (VOLTAREN) 1 % GEL, Apply 4 g topically 4 (four) times daily. Bilateral shoulders (Patient taking differently: Apply 4 g topically 4 (four) times daily as needed (shoulder pain).)   MAGNESIUM PO, Take 1 capsule by mouth daily.   VITAMIN D PO, Take  1 capsule by mouth. Does not take every day  Problem list He has Hyperlipidemia, mixed; Vitamin D deficiency; Essential hypertension; Anxiety state; Medication management; Poor compliance; Abnormal glucose; GERD ; Overweight (BMI 25.0-29.9); and Acute appendicitis on their problem list.   Observations/Objective:  BP 136/82   Pulse 81   Temp 97.9 F (36.6 C)   Resp 16   Ht 5\' 8"  (1.727 m)   Wt 169 lb 6.4 oz (76.8 kg)   SpO2 99%   BMI 25.76 kg/m   HEENT - WNL. Neck - supple.  Chest - Clear equal BS. Cor - Nl HS. RRR w/o sig MGR. PP 1(+). No edema. MS- FROM w/o deformities.  Gait Nl. Neuro -  Nl w/o focal abnormalities.   Assessment and Plan:  1. Fatigue,  - Testosterone  2. Testosterone deficiency, rule /out  - Testosterone  3. Medication management    Follow Up Instructions:       I discussed the assessment and treatment plan with the patient. The patient was provided an opportunity to ask questions and all were answered. The patient agreed with the plan and demonstrated an understanding of the instructions.       The patient was advised to call back or seek an in-person evaluation if the symptoms worsen or if the condition fails to improve as anticipated.    , MD

## 2022-08-27 NOTE — Patient Instructions (Signed)
Hypogonadism  ( Testosterone Deficiency )   Male hypogonadism is a condition of having a level of testosterone that is lower than normal. Testosterone is a chemical, or hormone, that is made mainly in the testicles. In boys, testosterone is responsible for the development of male characteristics during puberty. These include: Making the penis bigger. Growing and building the muscles. Growing facial hair. Deepening the voice. In adult men, testosterone is responsible for maintaining: An interest in sex and the ability to have sex. Muscle mass. Sperm production. Red blood cell production. Bone strength. Testosterone also gives men energy and a sense of well-being. Testosterone normally decreases as men age and the testicles make less testosterone. Testosterone levels can vary from man to man. Not all men will have signs and symptoms of low testosterone. Weight, alcohol use, medicines, and certain medical conditions can affect a man's testosterone level. What are the causes? This condition is caused by: A natural decrease in testosterone that occurs as a man grows older. This is the main cause of this condition. Use of medicines, such as antidepressants, steroids, and opioids. Diseases and conditions that affect the testicles or the making of testosterone. These include: Injury or damage to the testicles from trauma, cancer, cancer treatment, or infection. Diabetes. Sleep apnea. Genetic conditions that men are born with. Disease of the pituitary gland. This gland is in the brain. It produces hormones. Obesity. Metabolic syndrome. This is a group of diseases that affect blood pressure, blood sugar, cholesterol, and belly fat. HIV or AIDS. Alcohol abuse. Kidney failure. Other long-term or chronic diseases. What are the signs or symptoms? Common symptoms of this condition include: Loss of interest in sex (low sex drive). Inability to have or maintain an erection (erectile  dysfunction). Feeling tired (fatigue). Mood changes, like irritability or depression. Loss of muscle and body hair. Infertility. Large breasts. Weight gain (obesity). How is this diagnosed? Your health care provider can diagnose hypogonadism based on: Your signs and symptoms. A physical exam to check your testosterone levels. This includes blood tests. Testosterone levels can change throughout the day. Levels are highest in the morning. You may need to have repeat blood tests before getting a diagnosis of hypogonadism. Depending on your medical history and test results, your health care provider may also do other tests to find the cause of low testosterone. How is this treated? This condition is treated with testosterone replacement therapy. Testosterone can be given by: Injection or through pellets inserted under the skin. Gels or patches placed on the skin or in the mouth. Testosterone therapy is not for everyone. It has risks and side effects. Your health care provider will consider your medical history, your risk for prostate cancer, your age, and your symptoms before putting you on testosterone replacement therapy. Follow these instructions at home: Take over-the-counter and prescription medicines only as told by your health care provider. Eat foods that are high in fiber, such as beans, whole grains, and fresh fruits and vegetables. Limit foods that are high in fat and processed sugars, such as fried or sweet foods. If you drink alcohol: Limit how much you have to 0-2 drinks a day. Know how much alcohol is in your drink. In the [place] one drink equals one 12 oz bottle of beer (355 mL), one 5 oz glass of wine (148 mL), or one 1 oz glass of hard liquor (44 mL). Return to your normal activities as told by your health care provider. Ask your health care provider what activities are  safe for you. Keep all follow-up visits. This is important. Contact a health care provider if: You have any  of the signs or symptoms of low testosterone. You have any side effects from testosterone therapy. Summary Male hypogonadism is a condition of having a level of testosterone that is lower than normal. The natural drop in testosterone production that occurs with age is the most common cause of this condition. Low testosterone can also be caused by many diseases and conditions that affect the testicles and the making of testosterone. This condition is treated with testosterone replacement therapy. There are risks and side effects of testosterone therapy. Your health care provider will consider your age, medical history, symptoms, and risks for prostate cancer before putting you on testosterone therapy. This information is not intended to replace advice given to you by your health care provider. Make sure you discuss any questions you have with your health care provider. Document Revised: 04/19/2020 Document Reviewed: 04/19/2020 Elsevier Patient Education  2023 [employer].

## 2022-08-27 NOTE — Progress Notes (Incomplete)
Future Appointments  Date Time Provider Department  08/27/2022  2:30 PM [name], MD [facility]  10/30/2022 10:00 AM [name], MD [facility]    History of Present Illness:       This 56 y.o.  MWM  with HTN, HLD, Prediabetes and Vitamin D Deficiency presentd in August  with concerns of "stomach pains" . Patient presents with a 4-5 day hx/o EG  - > RUD abdominal pain described as an ache  quantified at 9-10 /10 at it's peak. Some Nausea / no emesis. Denies heartburn or reflux sx's. No fever chills, sweats or rash. Minimal solid food intake over the last 3-4 days.      On Nov 11, her presented to ER with acute appendicitis  , underwent uncomplicated appendectomy & went home the following day.    Medications   Current Outpatient Medications (Cardiovascular):  .  ezetimibe (ZETIA) 10 MG tablet, Take  1 tablet  Daily  for Cholesterol (Patient taking differently: Take 10 mg by mouth daily.) .  losartan (COZAAR) 100 MG tablet, TAKE 1 TABLET DAILY FOR BLOOD PRESSURE. (Patient taking differently: Take 100 mg by mouth daily.) .  sildenafil (VIAGRA) 100 MG tablet, TAKE 1/2 TO 1 TABLET AS NEEDED. (Patient taking differently: Take 50-100 mg by mouth as needed for erectile dysfunction.) .  tadalafil (CIALIS) 20 MG tablet, Take 1/2 to 1 tablet every 2 to 3 days as needed for XXXX (Patient not taking: Reported on 07/12/2022)   Current Outpatient Medications (Analgesics):  Marland Kitchen  Aspirin-Acetaminophen-Caffeine (GOODY HEADACHE PO), Take 1 Package by mouth 2 (two) times daily as needed (for pain). .  naproxen sodium (ALEVE) 220 MG tablet, Take 440 mg by mouth daily as needed (pain relief). Marland Kitchen  oxyCODONE (OXY IR/ROXICODONE) 5 MG immediate release tablet, Take 1-2 tablets (5-10 mg total) by mouth every 6 (six) hours as needed for moderate pain.  Current Outpatient Medications (Hematological):  Marland Kitchen  Cyanocobalamin (B-12 PO), Take 1 tablet by mouth daily.  Current Outpatient Medications  (Other):  Marland Kitchen  ALPRAZolam (XANAX) 1 MG tablet, Take 1/2 - 1 tablet 2 - 3 x /day ONLY if needed for Anxiety Attack &  limit to 5 days /week to avoid Addiction & Dementia .  amoxicillin-clavulanate (AUGMENTIN) 875-125 MG tablet, Take 1 tablet by mouth 2 (two) times daily. Marland Kitchen  CINNAMON PO, Take 1 capsule by mouth daily. .  diclofenac sodium (VOLTAREN) 1 % GEL, Apply 4 g topically 4 (four) times daily. Bilateral shoulders (Patient taking differently: Apply 4 g topically 4 (four) times daily as needed (shoulder pain).) .  MAGNESIUM PO, Take 1 capsule by mouth daily. Marland Kitchen  VITAMIN D PO, Take 1 capsule by mouth. Does not take every day  Problem list He has Hyperlipidemia, mixed; Vitamin D deficiency; Essential hypertension; Anxiety state; Medication management; Poor compliance; Abnormal glucose; GERD ; Overweight (BMI 25.0-29.9); and Acute appendicitis on their problem list.   Observations/Objective:  There were no vitals taken for this visit.  HEENT - WNL. Neck - supple.  Chest - Clear equal BS. Cor - Nl HS. RRR w/o sig MGR. PP 1(+). No edema. MS- FROM w/o deformities.  Gait Nl. Neuro -  Nl w/o focal abnormalities.   Assessment and Plan:      Follow Up Instructions:        I discussed the assessment and treatment plan with the patient. The patient was provided an opportunity to ask questions and all were answered. The patient agreed with the plan  and demonstrated an understanding of the instructions.       The patient was advised to call back or seek an in-person evaluation if the symptoms worsen or if the condition fails to improve as anticipated.    Kirtland Bouchard, MD

## 2022-08-28 LAB — TESTOSTERONE: Testosterone: 621 ng/dL (ref 250–827)

## 2022-08-28 NOTE — Progress Notes (Signed)
<><><><><><><><><><><><><><><><><><><><><><><><><><><><><><><><><> <><><><><><><><><><><><><><><><><><><><><><><><><><><><><><><><><>  -   Testosterone = 6221    - Ideal Desired range is between 450  to 850,                                       So this is an Excellent & Normal Level  &    No testosterone meds are indicated   &   in fact if took Testosterone meds would be at risk to cause                          blood clots in legs or to heart & lungs or cause a heart attack or stroke  <><><><><><><><><><><><><><><><><><><><><><><><><><><><><><><><><> <><><><><><><><><><><><><><><><><><><><><><><><><><><><><><><><><>

## 2022-08-29 ENCOUNTER — Encounter: Payer: Self-pay | Admitting: Internal Medicine

## 2022-10-20 ENCOUNTER — Other Ambulatory Visit: Payer: Self-pay | Admitting: Internal Medicine

## 2022-10-29 ENCOUNTER — Encounter: Payer: Self-pay | Admitting: Internal Medicine

## 2022-10-29 NOTE — Patient Instructions (Signed)

## 2022-10-29 NOTE — Progress Notes (Signed)
Annual  Screening/Preventative Visit  & Comprehensive Evaluation & Examination   Future Appointments  Date Time Provider Department  10/24/2021 10:00 AM Unk Pinto, MD [facility]  10/30/2022 10:00 AM Unk Pinto, MD [facility]            This very nice 57 y.o.  MWM  presents for a Screening /Preventative Visit & comprehensive evaluation and management of multiple medical co-morbidities.  Patient has been followed for HTN, HLD, Prediabetes and Vitamin D Deficiency.       HTN predates since 2010. Patient's BP has been controlled at home.  Today's BP is at goal - 136/86 . Patient denies any cardiac symptoms as chest pain, palpitations, shortness of breath, dizziness or ankle swelling.        Patient's hyperlipidemia was  not  controlled with diet and last year patient did agree to take low dose rosuvastatin for cholesterol i\. Last lipids were not at goal :  Lab Results  Component Value Date   CHOL 193 05/19/2022   HDL 40 05/19/2022   LDLCALC 130 (H) 05/19/2022   TRIG 123 05/19/2022   CHOLHDL 4.8 05/19/2022         Patient has hx/o prediabetes (A1c 5.7%  /2012)  and patient denies reactive hypoglycemic symptoms, visual blurring, diabetic polys or paresthesias. Last A1c was at goal :   Lab Results  Component Value Date   HGBA1C 5.1 05/19/2022         Finally, patient has history of Vitamin D Deficiency ("35" /2010) and last vitamin D was still low :    Lab Results  Component Value Date   VD25OH 36 05/19/2022       Current Outpatient Medications  Medication Instructions   ALPRAZolam (XANAX) 1 MG tablet Take 1/2 - 1 tablet 2 - 3 x /day ONLY if needed for Anxiety Attack & limit to 5 days /week to avoid Addiction & Dementia   Aspirin-Acetaminophen-Caffeine (GOODY HEADACHE PO) 1 Package, Oral, 2 times daily PRN   CINNAMON PO 1 capsule Daily   Cyanocobalamin (B-12 PO) 1 tablet  Daily   diclofenac sodium (VOLTAREN) 4 g, Topical, 4 times daily, Bilateral  shoulders   ELDERBERRY   Daily   ezetimibe (ZETIA) 10 MG tablet Take  1 tablet  Daily  for Cholesterol   losartan (COZAAR) 100 MG tablet TAKE 1 TABLET DAILY FOR BLOOD PRESSURE.   MAGNESIUM  1 capsule Daily   naproxen sodium (ALEVE) 440 mg Daily PRN   sildenafil (VIAGRA) 100 MG tablet TAKE 1/2 TO 1 TABLET AS NEEDED.   VITAMIN D  1 capsule Does not take every day    Zinc 50 MG TABS Oral, Daily     Allergies  Allergen Reactions   Lisinopril Cough   Olmesartan Other (See Comments)    Weakness, joint pain   Vicodin [Hydrocodone-Acetaminophen] Other (See Comments)    "makes my skin crawl"     Past Medical History:  Diagnosis Date   Hyperlipidemia    Hypertension    Hypogonadism male    Vitamin D deficiency      Health Maintenance  Topic Date Due   COVID-19 Vaccine (1) Never done   Hepatitis C Screening  Never done   Zoster Vaccines- Shingrix (1 of 2) Never done   COLONOSCOPY  Never done   INFLUENZA VACCINE  Never done   TETANUS/TDAP  07/31/2026   HIV Screening  Completed   HPV VACCINES  Aged [employer] History  Administered Date(s) Administered   DTaP 09/02/2007   PPD Test 06/19/2014, 06/28/2015, 07/31/2016, 03/28/2020    Last Colon - Never & also declines to do Cologard  Past Surgical History:  Procedure Laterality Date   FINGER SURGERY     ROTATOR CUFF REPAIR       Family History  Problem Relation Age of Onset   Heart attack Mother    Kidney failure Father    Hypertension Brother    Hypothyroidism Brother    Hyperlipidemia Brother      Social History   Tobacco Use   Smoking status: Every Day    Packs/day: 1.00    Types: Cigarettes   Smokeless tobacco: Never  Substance Use Topics   Alcohol use: Yes    Alcohol/week: 7.0 standard drinks    Types: 7 Standard drinks or equivalent per week    Comment: occasional   Drug use: No      ROS Constitutional: Denies fever, chills, weight loss/gain, headaches, insomnia,  night sweats or  change in appetite. Does c/o fatigue. Eyes: Denies redness, blurred vision, diplopia, discharge, itchy or watery eyes.  ENT: Denies discharge, congestion, post nasal drip, epistaxis, sore throat, earache, hearing loss, dental pain, Tinnitus, Vertigo, Sinus pain or snoring.  Cardio: Denies chest pain, palpitations, irregular heartbeat, syncope, dyspnea, diaphoresis, orthopnea, PND, claudication or edema Respiratory: denies cough, dyspnea, DOE, pleurisy, hoarseness, laryngitis or wheezing.  Gastrointestinal: Denies dysphagia, heartburn, reflux, water brash, pain, cramps, nausea, vomiting, bloating, diarrhea, constipation, hematemesis, melena, hematochezia, jaundice or hemorrhoids Genitourinary: Denies dysuria, frequency, urgency, nocturia, hesitancy, discharge, hematuria or flank pain Musculoskeletal: Denies arthralgia, myalgia, stiffness, Jt. Swelling, pain, limp or strain/sprain. Denies Falls. Skin: Denies puritis, rash, hives, warts, acne, eczema or change in skin lesion Neuro: No weakness, tremor, incoordination, spasms, paresthesia or pain Psychiatric: Denies confusion, memory loss or sensory loss. Denies Depression. Endocrine: Denies change in weight, skin, hair change, nocturia, and paresthesia, diabetic polys, visual blurring or hyper / hypo glycemic episodes.  Heme/Lymph: No excessive bleeding, bruising or enlarged lymph nodes.   Physical Exam  BP 136/86   Pulse 76   Temp (!) 97.3 F (36.3 C)   Ht '5\' 7"'$  (1.702 m)   Wt 170 lb 3.2 oz (77.2 kg)   SpO2 99%   BMI 26.66 kg/m   General Appearance: Well nourished and well groomed and in no apparent distress.  Eyes: PERRLA, EOMs, conjunctiva no swelling or erythema, normal fundi and vessels. Sinuses: No frontal/maxillary tenderness ENT/Mouth: EACs patent / TMs  nl. Nares clear without erythema, swelling, mucoid exudates. Oral hygiene is good. No erythema, swelling, or exudate. Tongue normal, non-obstructing. Tonsils not swollen or  erythematous. Hearing normal.  Neck: Supple, thyroid not palpable. No bruits, nodes or JVD. Respiratory: Respiratory effort normal.  BS equal and clear bilateral without rales, rhonci, wheezing or stridor. Cardio: Heart sounds are normal with regular rate and rhythm and no murmurs, rubs or gallops. Peripheral pulses are normal and equal bilaterally without edema. No aortic or femoral bruits. Chest: symmetric with normal excursions and percussion.  Abdomen: Soft, with Nl bowel sounds. Nontender, no guarding, rebound, hernias, masses, or organomegaly.  Lymphatics: Non tender without lymphadenopathy.  Musculoskeletal: Full ROM all peripheral extremities, joint stability, 5/5 strength, and normal gait. Skin: Warm and dry without rashes, lesions, cyanosis, clubbing or  ecchymosis.  Neuro: Cranial nerves intact, reflexes equal bilaterally. Normal muscle tone, no cerebellar symptoms. Sensation intact.  Pysch: Alert and oriented X 3 with normal affect, insight and judgment appropriate.  Assessment and Plan  1. Annual Preventative/Screening Exam    2. Essential hypertension  - EKG 12-Lead - [place], RETROPERITNL ABD,  LTD - Urinalysis, Routine w reflex microscopic - CBC with Differential/Platelet - COMPLETE METABOLIC PANEL WITH GFR - Magnesium - TSH  3. Hyperlipidemia, mixed  - EKG 12-Lead - [place], RETROPERITNL ABD,  LTD - Lipid panel - TSH  4. Abnormal glucose  - EKG 12-Lead - [place], RETROPERITNL ABD,  LTD - Hemoglobin A1c - Insulin, random  5. Vitamin D deficiency  - VITAMIN D 25 Hydroxy   6. Screening for colorectal cancer  - POC Hemoccult Bld/Stl  - Cologuard  7. Screening examination for pulmonary tuberculosis  - TB Skin Test  8. Prostate cancer screening  - PSA  9. Screening for heart disease  - EKG 12-Lead  10. FHx: heart disease  - EKG 12-Lead - [place], RETROPERITNL ABD,  LTD  11. Smoker  - EKG 12-Lead - [place], RETROPERITNL ABD,  LTD  12. Screening for AAA  (aortic abdominal aneurysm)  - [place], RETROPERITNL ABD,  LTD  13. Fatigue, unspecified type  - Iron, Total/Total Iron Binding Cap - Vitamin B12 - Testosterone - CBC with Differential/Platelet - TSH  14. Medication management  - Urinalysis, Routine w reflex microscopic - Microalbumin / creatinine urine ratio - CBC with Differential/Platelet - COMPLETE METABOLIC PANEL WITH GFR - Magnesium - Lipid panel - TSH - Hemoglobin A1c - Insulin, random - VITAMIN D 25 Hydroxy (Vit-D Deficiency, Fractures) - Cologuard        Patient was counseled in prudent diet, weight control to achieve/maintain BMI less than 25, BP monitoring, regular exercise and medications as discussed.  Discussed med effects and SE's. Routine screening labs and tests as requested with regular follow-up as recommended. Over 40 minutes of exam, counseling, chart review and high complex critical decision making was performed   Kirtland Bouchard, MD

## 2022-10-30 ENCOUNTER — Encounter: Payer: Self-pay | Admitting: Internal Medicine

## 2022-10-30 ENCOUNTER — Ambulatory Visit (INDEPENDENT_AMBULATORY_CARE_PROVIDER_SITE_OTHER): Payer: BC Managed Care – PPO | Admitting: Internal Medicine

## 2022-10-30 VITALS — BP 136/86 | HR 76 | Temp 97.3°F | Ht 67.0 in | Wt 170.2 lb

## 2022-10-30 DIAGNOSIS — R350 Frequency of micturition: Secondary | ICD-10-CM | POA: Diagnosis not present

## 2022-10-30 DIAGNOSIS — F17200 Nicotine dependence, unspecified, uncomplicated: Secondary | ICD-10-CM

## 2022-10-30 DIAGNOSIS — Z1329 Encounter for screening for other suspected endocrine disorder: Secondary | ICD-10-CM

## 2022-10-30 DIAGNOSIS — E559 Vitamin D deficiency, unspecified: Secondary | ICD-10-CM

## 2022-10-30 DIAGNOSIS — Z125 Encounter for screening for malignant neoplasm of prostate: Secondary | ICD-10-CM | POA: Diagnosis not present

## 2022-10-30 DIAGNOSIS — I700 Atherosclerosis of aorta: Secondary | ICD-10-CM

## 2022-10-30 DIAGNOSIS — Z79899 Other long term (current) drug therapy: Secondary | ICD-10-CM | POA: Diagnosis not present

## 2022-10-30 DIAGNOSIS — Z1211 Encounter for screening for malignant neoplasm of colon: Secondary | ICD-10-CM

## 2022-10-30 DIAGNOSIS — Z136 Encounter for screening for cardiovascular disorders: Secondary | ICD-10-CM | POA: Diagnosis not present

## 2022-10-30 DIAGNOSIS — R7309 Other abnormal glucose: Secondary | ICD-10-CM

## 2022-10-30 DIAGNOSIS — Z13220 Encounter for screening for lipoid disorders: Secondary | ICD-10-CM | POA: Diagnosis not present

## 2022-10-30 DIAGNOSIS — Z0000 Encounter for general adult medical examination without abnormal findings: Secondary | ICD-10-CM | POA: Diagnosis not present

## 2022-10-30 DIAGNOSIS — N401 Enlarged prostate with lower urinary tract symptoms: Secondary | ICD-10-CM | POA: Diagnosis not present

## 2022-10-30 DIAGNOSIS — Z131 Encounter for screening for diabetes mellitus: Secondary | ICD-10-CM

## 2022-10-30 DIAGNOSIS — R5383 Other fatigue: Secondary | ICD-10-CM

## 2022-10-30 DIAGNOSIS — Z130 Encounter for screening for diseases of the blood and blood-forming organs and certain disorders involving the immune mechanism: Secondary | ICD-10-CM

## 2022-10-30 DIAGNOSIS — Z0001 Encounter for general adult medical examination with abnormal findings: Secondary | ICD-10-CM

## 2022-10-30 DIAGNOSIS — Z1389 Encounter for screening for other disorder: Secondary | ICD-10-CM | POA: Diagnosis not present

## 2022-10-30 DIAGNOSIS — Z111 Encounter for screening for respiratory tuberculosis: Secondary | ICD-10-CM

## 2022-10-30 DIAGNOSIS — I10 Essential (primary) hypertension: Secondary | ICD-10-CM | POA: Diagnosis not present

## 2022-10-30 DIAGNOSIS — Z8249 Family history of ischemic heart disease and other diseases of the circulatory system: Secondary | ICD-10-CM

## 2022-10-30 DIAGNOSIS — E782 Mixed hyperlipidemia: Secondary | ICD-10-CM

## 2022-10-31 LAB — URINALYSIS, ROUTINE W REFLEX MICROSCOPIC
Bilirubin Urine: NEGATIVE
Glucose, UA: NEGATIVE
Hgb urine dipstick: NEGATIVE
Ketones, ur: NEGATIVE
Leukocytes,Ua: NEGATIVE
Nitrite: NEGATIVE
Protein, ur: NEGATIVE
Specific Gravity, Urine: 1.015 (ref 1.001–1.035)
pH: <=5 (ref 5.0–8.0)

## 2022-10-31 LAB — TESTOSTERONE: Testosterone: 521 ng/dL (ref 250–827)

## 2022-10-31 LAB — HEMOGLOBIN A1C
Hgb A1c MFr Bld: 5.3 % of total Hgb (ref <5.7)
Mean Plasma Glucose: 105 mg/dL
eAG (mmol/L): 5.8 mmol/L

## 2022-10-31 LAB — COMPLETE METABOLIC PANEL WITHOUT GFR
AG Ratio: 1.5 (calc) (ref 1.0–2.5)
ALT: 9 U/L (ref 9–46)
AST: 16 U/L (ref 10–35)
Albumin: 4.1 g/dL (ref 3.6–5.1)
Alkaline phosphatase (APISO): 76 U/L (ref 35–144)
BUN/Creatinine Ratio: 8 (calc) (ref 6–22)
BUN: 11 mg/dL (ref 7–25)
CO2: 24 mmol/L (ref 20–32)
Calcium: 9 mg/dL (ref 8.6–10.3)
Chloride: 106 mmol/L (ref 98–110)
Creat: 1.42 mg/dL — ABNORMAL HIGH (ref 0.70–1.30)
Globulin: 2.8 g/dL (ref 1.9–3.7)
Glucose, Bld: 111 mg/dL — ABNORMAL HIGH (ref 65–99)
Potassium: 3.9 mmol/L (ref 3.5–5.3)
Sodium: 140 mmol/L (ref 135–146)
Total Bilirubin: 0.4 mg/dL (ref 0.2–1.2)
Total Protein: 6.9 g/dL (ref 6.1–8.1)
eGFR: 58 mL/min/1.73m2 — ABNORMAL LOW

## 2022-10-31 LAB — LIPID PANEL
Cholesterol: 268 mg/dL — ABNORMAL HIGH (ref <200)
HDL: 40 mg/dL (ref >=40)
LDL Cholesterol (Calc): 200 mg/dL (calc) — ABNORMAL HIGH
Non-HDL Cholesterol (Calc): 228 mg/dL (calc) — ABNORMAL HIGH (ref <130)
Total CHOL/HDL Ratio: 6.7 (calc) — ABNORMAL HIGH (ref <5.0)
Triglycerides: 132 mg/dL (ref <150)

## 2022-10-31 LAB — CBC WITH DIFFERENTIAL/PLATELET
Absolute Monocytes: 536 cells/uL (ref 200–950)
Basophils Absolute: 63 cells/uL (ref 0–200)
Basophils Relative: 0.6 %
Eosinophils Absolute: 515 cells/uL — ABNORMAL HIGH (ref 15–500)
Eosinophils Relative: 4.9 %
HCT: 43.7 % (ref 38.5–50.0)
Hemoglobin: 14.9 g/dL (ref 13.2–17.1)
Lymphs Abs: 2058 cells/uL (ref 850–3900)
MCH: 28.3 pg (ref 27.0–33.0)
MCHC: 34.1 g/dL (ref 32.0–36.0)
MCV: 82.9 fL (ref 80.0–100.0)
MPV: 10.8 fL (ref 7.5–12.5)
Monocytes Relative: 5.1 %
Neutro Abs: 7329 cells/uL (ref 1500–7800)
Neutrophils Relative %: 69.8 %
Platelets: 196 10*3/uL (ref 140–400)
RBC: 5.27 10*6/uL (ref 4.20–5.80)
RDW: 13.2 % (ref 11.0–15.0)
Total Lymphocyte: 19.6 %
WBC: 10.5 10*3/uL (ref 3.8–10.8)

## 2022-10-31 LAB — IRON, TOTAL/TOTAL IRON BINDING CAP
%SAT: 14 % (calc) — ABNORMAL LOW (ref 20–48)
Iron: 54 ug/dL (ref 50–180)
TIBC: 375 mcg/dL (calc) (ref 250–425)

## 2022-10-31 LAB — PSA: PSA: 0.5 ng/mL (ref <=4.00)

## 2022-10-31 LAB — MICROALBUMIN / CREATININE URINE RATIO
Creatinine, Urine: 124 mg/dL (ref 20–320)
Microalb Creat Ratio: 10 mcg/mg creat (ref <30)
Microalb, Ur: 1.3 mg/dL

## 2022-10-31 LAB — VITAMIN B12: Vitamin B-12: 636 pg/mL (ref 200–1100)

## 2022-10-31 LAB — VITAMIN D 25 HYDROXY (VIT D DEFICIENCY, FRACTURES): Vit D, 25-Hydroxy: 37 ng/mL (ref 30–100)

## 2022-10-31 LAB — TSH: TSH: 0.69 mIU/L (ref 0.40–4.50)

## 2022-10-31 LAB — INSULIN, RANDOM: Insulin: 14.9 u[IU]/mL

## 2022-10-31 LAB — MAGNESIUM: Magnesium: 2.1 mg/dL (ref 1.5–2.5)

## 2022-11-01 NOTE — Progress Notes (Signed)
<><><><><><><><><><><><><><><><><><><><><><><><><><><><><><><><><> <><><><><><><><><><><><><><><><><><><><><><><><><><><><><><><><><>  -    Iron is low  - recommend take an OTC iron supplement  &     - Eat More Veggies with Iron as Carrots, Beets , all leafy green veggies as Spinach,   Collards,  Turnip - Mustard or Mixed Greens, Kale, Asparagus, Broccoli,   Brussel Sprouts, Green Beans / peas, Soybeans, Lentils, Sweet Potatoes  <><><><><><><><><><><><><><><><><><><><><><><><><><><><><><><><><>  - Vitamin B12 is normal Now   <><><><><><><><><><><><><><><><><><><><><><><><><><><><><><><><><>  - PSA - very low - No Prostate cancer  - Great !   <><><><><><><><><><><><><><><><><><><><><><><><><><><><><><><><><>  -  Testosterone Level is Normal  <><><><><><><><><><><><><><><><><><><><><><><><><><><><><><><><><>  - A1c - Normal - No Diabetes  -  Great   ! <><><><><><><><><><><><><><><><><><><><><><><><><><><><><><><><><>  - Vitamin D = 37  is STILL very LOW   - Vitamin D goal is between 70-100.   - Please Take Recommended 10,000 units  Vitamin D DAILY  !  - It is very important as a natural anti-inflammatory and helping the                                     immune system protect against viral infections, like the Covid-19    helping hair, skin, and nails, as well as reducing stroke and heart attack risk.   - It helps your bones and helps with mood.  - It also decreases numerous cancer risks so please  take it as directed.   - Low Vit D is associated with a 200-300% higher risk for  CANCER   and 200-300% higher risk for HEART   ATTACK  &  STROKE.    - It is also associated with higher death rate at younger ages,   autoimmune diseases like Rheumatoid arthritis, Lupus,  Multiple Sclerosis.     - Also many other serious conditions, like depression, Alzheimer's  Dementia, infertility, muscle aches, fatigue, fibromyalgia    <><><><><><><><><><><><><><><><><><><><><><><><><><><><><><><><><>  - All Else - CBC - Kidneys - Electrolytes - Liver - Magnesium & Thyroid    - all  Normal / OK <><><><><><><><><><><><><><><><><><><><><><><><><><><><><><><><><>  -

## 2022-11-02 ENCOUNTER — Encounter: Payer: Self-pay | Admitting: Internal Medicine

## 2022-11-03 ENCOUNTER — Encounter: Payer: Self-pay | Admitting: Internal Medicine

## 2022-11-13 ENCOUNTER — Other Ambulatory Visit: Payer: Self-pay | Admitting: Internal Medicine

## 2022-11-13 DIAGNOSIS — E782 Mixed hyperlipidemia: Secondary | ICD-10-CM

## 2022-12-18 ENCOUNTER — Other Ambulatory Visit: Payer: Self-pay | Admitting: Internal Medicine

## 2023-01-20 ENCOUNTER — Other Ambulatory Visit: Payer: Self-pay | Admitting: Nurse Practitioner

## 2023-01-20 DIAGNOSIS — I10 Essential (primary) hypertension: Secondary | ICD-10-CM

## 2023-02-10 ENCOUNTER — Other Ambulatory Visit: Payer: Self-pay | Admitting: Nurse Practitioner

## 2023-02-15 ENCOUNTER — Encounter: Payer: Self-pay | Admitting: Internal Medicine

## 2023-02-15 NOTE — Progress Notes (Signed)
Future Appointments  Date Time Provider Department  02/16/2023  9:30 AM [name], MD [facility]  05/19/2023  9:30 AM [name], MD [facility]  08/18/2023  9:30 AM [name], MD [facility]  11/16/2023 10:00 AM [name], MD [facility]    History of Present Illness:       This very nice 57 y.o. MWM  presents for 3 month follow up with HTN, HLD, Pre-Diabetes and Vitamin D Deficiency.  Patient has had low iron sat's for the last 3 years. He did not return his cologard atfter his CPE  & indicated that he has no intention to complete. Patient indicated that he is not taking Cinnamon, Vitamin B12 , Elderberry  or Vitamin D .         Patient is treated for HTN  (2010)  & BP has been controlled at home. Today's BP is at goal -  128/80. Patient has had no complaints of any cardiac type chest pain, palpitations, dyspnea Pollyann Kennedy /PND, dizziness, claudication or dependent edema.        Hyperlipidemia is not controlled with diet & patient has been reticent to take Statins, but has agreed to take Ezetimibe . Patient denies myalgias or other med SE's.  He has been advised that his very elevated LDL Chol is very high risk fi CV complications. Last Lipids were not at goal :  Lab Results  Component Value Date   CHOL 268 (H) 10/30/2022   HDL 40 10/30/2022   LDLCALC 200 (H) 10/30/2022   TRIG 132 10/30/2022   CHOLHDL 6.7 (H) 10/30/2022     Also, the patient has history of PreDiabetes  (A1c 5.7%  /2012) and has had no symptoms of reactive hypoglycemia, diabetic polys, paresthesias or visual blurring.  Last A1c was normal & at goal :  Lab Results  Component Value Date   HGBA1C 5.3 10/30/2022                                                         Further, the patient also has  history of Vitamin D Deficiency ("35" /2010)  and  sporadically supplements vitamin D . Last vitamin D was still very low :  Lab Results  Component Value Date   VD25OH 37 10/30/2022      Current Outpatient Medications on File Prior to Visit  Medication Sig   ALPRAZolam 1 MG tablet TAKE 1/2 TO 1 TABLET 2-3 times a DAY ONLY AS NEEDED    GOODY HEADACHE  Take 1 Package 2 ( times daily as needed (for pain).       ezetimibe (ZETIA) 10 MG tablet TAKE ONE TABLET DAILY    losartan 100 MG tablet TAKE ONE TABLET DAILY    MAGNESIUM  Take 1 capsule daily.   naproxen 220 MG tablet Take 440 mg daily as needed (pain relief).   sildenafil  100 MG tablet TAKE 1/2 TO 1 TABLET AS NEEDED     Does not take    Zinc 50 MG TABS Take daily.     Allergies  Allergen Reactions   Lisinopril Cough   Olmesartan Other (See Comments)    Weakness, joint pain   Vicodin [Hydrocodone-Acetaminophen] Other (See Comments)    "makes my skin crawl"     PMHx:   Past Medical History:  Diagnosis Date  Hyperlipidemia    Hypertension    Hypogonadism male    Vitamin D deficiency      Immunization History  Administered Date(s) Administered   DTaP 09/02/2007   PPD Test 03/28/2020, 10/30/2022     Past Surgical History:  Procedure Laterality Date   FINGER SURGERY     LAPAROSCOPIC APPENDECTOMY N/A 07/12/2022   Procedure: APPENDECTOMY LAPAROSCOPIC;  Surgeon: Berna Bue, MD;  Location: [facility] OR;  Service: General;  Laterality: N/A;   ROTATOR CUFF REPAIR       FHx:    Reviewed / unchanged   SHx:    Reviewed / unchanged    Systems Review:  Constitutional: Denies fever, chills, wt changes, headaches, insomnia, fatigue, night sweats, change in appetite. Eyes: Denies redness, blurred vision, diplopia, discharge, itchy, watery eyes.  ENT: Denies discharge, congestion, post nasal drip, epistaxis, sore throat, earache, hearing loss, dental pain, tinnitus, vertigo, sinus pain, snoring.  CV: Denies chest pain, palpitations, irregular heartbeat, syncope, dyspnea, diaphoresis, orthopnea, PND, claudication or edema. Respiratory: denies cough, dyspnea, DOE, pleurisy, hoarseness, laryngitis,  wheezing.  Gastrointestinal: Denies dysphagia, odynophagia, heartburn, reflux, water brash, abdominal pain or cramps, nausea, vomiting, bloating, diarrhea, constipation, hematemesis, melena, hematochezia  or hemorrhoids. Genitourinary: Denies dysuria, frequency, urgency, nocturia, hesitancy, discharge, hematuria or flank pain. Musculoskeletal: Denies arthralgias, myalgias, stiffness, jt. swelling, pain, limping or strain/sprain.  Skin: Denies pruritus, rash, hives, warts, acne, eczema or change in skin lesion(s). Neuro: No weakness, tremor, incoordination, spasms, paresthesia or pain. Psychiatric: Denies confusion, memory loss or sensory loss. Endo: Denies change in weight, skin or hair change.  Heme/Lymph: No excessive bleeding, bruising or enlarged lymph nodes.   Physical Exam  BP 128/80   Pulse 76   Temp 97.9 F (36.6 C)   Resp 16   Ht 5\' 7"  (1.702 m)   Wt 170 lb 3.2 oz (77.2 kg)   SpO2 99%   BMI 26.66 kg/m   Appears  well nourished, well groomed  and in no distress.  Eyes: PERRLA, EOMs, conjunctiva no swelling or erythema. Sinuses: No frontal/maxillary tenderness ENT/Mouth: EAC's clear, TM's nl w/o erythema, bulging. Nares clear w/o erythema, swelling, exudates. Oropharynx clear without erythema or exudates. Oral hygiene is good. Tongue normal, non obstructing. Hearing intact.  Neck: Supple. Thyroid not palpable. Car 2+/2+ without bruits, nodes or JVD. Chest: Respirations nl with BS clear & equal w/o rales, rhonchi, wheezing or stridor.  Cor: Heart sounds normal w/ regular rate and rhythm without sig. murmurs, gallops, clicks or rubs. Peripheral pulses normal and equal  without edema.  Abdomen: Soft & bowel sounds normal. Non-tender w/o guarding, rebound, hernias, masses or organomegaly.  Lymphatics: Unremarkable.  Musculoskeletal: Full ROM all peripheral extremities, joint stability, 5/5 strength and normal gait.  Skin: Warm, dry without exposed rashes, lesions or ecchymosis  apparent.  Neuro: Cranial nerves intact, reflexes equal bilaterally. Sensory-motor testing grossly intact. Tendon reflexes grossly intact.  Pysch: Alert & oriented x 3.  Insight and judgement nl & appropriate. No ideations.   Assessment and Plan:  1. Essential hypertension  - Continue medication, monitor blood pressure at home.  - Continue DASH diet.  Reminder to go to the ER if any CP,  SOB, nausea, dizziness, severe HA, changes vision/speech.    - CBC with Differential/Platelet - COMPLETE METABOLIC PANEL WITH GFR - Magnesium - TSH  2. Hyperlipidemia, mixed  - Continue diet/meds, exercise,& lifestyle modifications.  - Continue monitor periodic cholesterol/liver & renal functions      - Lipid panel -  TSH  3. Abnormal glucose  - Continue diet, exercise  - Lifestyle modifications.  - Monitor appropriate labs    - Hemoglobin A1c - Insulin, random  4. Vitamin D deficiency  - Continue supplementation     - VITAMIN D 25 Hydroxy   5. Vitamin B12 deficiency  - Vitamin B12  6. Iron deficiency  - Iron, Total/Total Iron Binding Cap  7. Medication management  - CBC with Differential/Platelet - COMPLETE METABOLIC PANEL WITH GFR - Magnesium - Lipid panel - TSH - Hemoglobin A1c - Insulin, random - VITAMIN D 25 Hydroxy  - Iron, Total/Total Iron Binding Cap - Vitamin B12          Discussed  regular exercise, BP monitoring, weight control to achieve/maintain BMI less than 25 and discussed med and SE's. Recommended labs to assess /monitor clinical status .  I discussed the assessment and treatment plan with the patient. The patient was provided an opportunity to ask questions and all were answered. The patient agreed with the plan and demonstrated an understanding of the instructions.  I provided over 30 minutes of exam, counseling, chart review and  complex critical decision making.        The patient was advised to call back or seek an in-person evaluation if the  symptoms worsen or if the condition fails to improve as anticipated.   Marinus Maw, MD

## 2023-02-15 NOTE — Patient Instructions (Addendum)
Due to recent changes in healthcare laws, you may see the results of your imaging and laboratory studies on MyChart before your provider has had a chance to review them.  We understand that in some cases there may be results that are confusing or concerning to you. Not all laboratory results come back in the same time frame and the provider may be waiting for multiple results in order to interpret others.  Please give [place] 48 hours in order for your provider to thoroughly review all the results before contacting the office for clarification of your results.  ++++++++++++++++++++++++++  Vit B12 has been low at 353  ( Ideal or Goal is between  400 -1,100 )   Iron levels   have been below normal for the last 3 years   Last bad LDL = 200 ( Ideal or Goal is less than 70 )   Last Vit D Level = 37  ( Ideal or goal  is between 70-100 )   ++++++++++++++++++++++++++  Vit D  & Vit C 1,000 mg   are recommended to help protect  against the Covid-19 and other Corona viruses.    Also it's recommended  to take  Zinc 50 mg  to help  protect against the Covid-19   and best place to get  is also on [employer].com  and don't pay more than 6-8 cents /pill !   +++++++++++++++++++++++++++++++++++++++ Recommend Adult Low Dose Aspirin or  coated  Aspirin 81 mg daily  To reduce risk of Colon Cancer 40 %,  Skin Cancer 26 % ,  Melanoma 46%  and  Pancreatic cancer 60% +++++++++++++++++++++++++++++++++++++++++ Vitamin D goal  is between 70-100.  Please make sure that you are taking your Vitamin D as directed.  It is very important as a natural anti-inflammatory  helping hair, skin, and nails, as well as reducing stroke and heart attack risk.  It helps your bones and helps with mood. It also decreases numerous cancer risks so please take it as directed.  Low Vit D is associated with a 200-300% higher risk for CANCER  and 200-300% higher risk for HEART   ATTACK  &  STROKE.    .....................................Marland Kitchen It is also associated with higher death rate at younger ages,  autoimmune diseases like Rheumatoid arthritis, Lupus, Multiple Sclerosis.    Also many other serious conditions, like depression, Alzheimer's Dementia, infertility, muscle aches, fatigue, fibromyalgia - just to name a few. +++++++++++++++++++++++++++++++++++++++++ Recommend the book "The END of DIETING" by Dr Monico Hoar  & the book "The END of DIABETES " by Dr Monico Hoar At [email or web address] - get book & Audio CD's    Being diabetic has a  300% increased risk for heart attack, stroke, cancer, and alzheimer- type vascular dementia. It is very important that you work harder with diet by avoiding all foods that are white. Avoid white rice (brown & wild rice is OK), white potatoes (sweetpotatoes in moderation is OK), White bread or wheat bread or anything made out of white flour like bagels, donuts, rolls, buns, biscuits, cakes, pastries, cookies, pizza crust, and pasta (made from white flour & egg whites) - vegetarian pasta or spinach or wheat pasta is OK. Multigrain breads like Arnold's or [employer], or multigrain sandwich thins or flatbreads.  Diet, exercise and weight loss can reverse and cure diabetes in the early stages.  Diet, exercise and weight loss is very important in the control and prevention of complications of diabetes which affects every system in your  body, ie. Brain - dementia/stroke, eyes - glaucoma/blindness, heart - heart attack/heart failure, kidneys - dialysis, stomach - gastric paralysis, intestines - malabsorption, nerves - severe painful neuritis, circulation - gangrene & loss of a leg(s), and finally cancer and Alzheimers.    I recommend avoid fried & greasy foods,  sweets/candy, white rice (brown or wild rice or Quinoa is OK), white potatoes (sweet potatoes are OK) - anything made from white flour - bagels, doughnuts, rolls, buns, biscuits,white and wheat breads, pizza crust  and traditional pasta made of white flour & egg white(vegetarian pasta or spinach or wheat pasta is OK).  Multi-grain bread is OK - like multi-grain flat bread or sandwich thins. Avoid alcohol in excess. Exercise is also important.    Eat all the vegetables you want - avoid meat, especially red meat and dairy - especially cheese.  Cheese is the most concentrated form of trans-fats which is the worst thing to clog up our arteries. Veggie cheese is OK which can be found in the fresh produce section at [employer] or [employer] or [employer]  +++++++++++++++++++++++++++++++++++++++ DASH Eating Plan  DASH stands for "Dietary Approaches to Stop Hypertension."   The DASH eating plan is a healthy eating plan that has been shown to reduce high blood pressure (hypertension). Additional health benefits may include reducing the risk of type 2 diabetes mellitus, heart disease, and stroke. The DASH eating plan may also help with weight loss. WHAT DO I NEED TO KNOW ABOUT THE DASH EATING PLAN? For the DASH eating plan, you will follow these general guidelines: Choose foods with a percent daily value for sodium of less than 5% (as listed on the food label). Use salt-free seasonings or herbs instead of table salt or sea salt. Check with your health care provider or pharmacist before using salt substitutes. Eat lower-sodium products, often labeled as "lower sodium" or "no salt added." Eat fresh foods. Eat more vegetables, fruits, and low-fat dairy products. Choose whole grains. Look for the word "whole" as the first word in the ingredient list. Choose fish  Limit sweets, desserts, sugars, and sugary drinks. Choose heart-healthy fats. Eat veggie cheese  Eat more home-cooked food and less restaurant, buffet, and fast food. Limit fried foods. Cook foods using methods other than frying. Limit canned vegetables. If you do use them, rinse them well to decrease the sodium. When eating at a restaurant, ask that  your food be prepared with less salt, or no salt if possible.                      WHAT FOODS CAN I EAT? Read Dr Francis Dowse Fuhrman's books on The End of Dieting & The End of Diabetes  Grains Whole grain or whole wheat bread. Brown rice. Whole grain or whole wheat pasta. Quinoa, bulgur, and whole grain cereals. Low-sodium cereals. Corn or whole wheat flour tortillas. Whole grain cornbread. Whole grain crackers. Low-sodium crackers.  Vegetables Fresh or frozen vegetables (raw, steamed, roasted, or grilled). Low-sodium or reduced-sodium tomato and vegetable juices. Low-sodium or reduced-sodium tomato sauce and paste. Low-sodium or reduced-sodium canned vegetables.   Fruits All fresh, canned (in natural juice), or frozen fruits.  Protein Products  All fish and seafood.  Dried beans, peas, or lentils. Unsalted nuts and seeds. Unsalted canned beans.  Dairy Low-fat dairy products, such as skim or 1% milk, 2% or reduced-fat cheeses, low-fat ricotta or cottage cheese, or plain low-fat yogurt. Low-sodium or reduced-sodium cheeses.  Fats and Oils Tub margarines  without trans fats. Light or reduced-fat mayonnaise and salad dressings (reduced sodium). Avocado. Safflower, olive, or canola oils. Natural peanut or almond butter.  Other Unsalted popcorn and pretzels. The items listed above may not be a complete list of recommended foods or beverages. Contact your dietitian for more options.  +++++++++++++++  WHAT FOODS ARE NOT RECOMMENDED? Grains/ White flour or wheat flour White bread. White pasta. White rice. Refined cornbread. Bagels and croissants. Crackers that contain trans fat.  Vegetables  Creamed or fried vegetables. Vegetables in a . Regular canned vegetables. Regular canned tomato sauce and paste. Regular tomato and vegetable juices.  Fruits Dried fruits. Canned fruit in light or heavy syrup. Fruit juice.  Meat and Other Protein Products Meat in general - RED meat & White meat.  Fatty  cuts of meat. Ribs, chicken wings, all processed meats as bacon, sausage, bologna, salami, fatback, hot dogs, bratwurst and packaged luncheon meats.  Dairy Whole or 2% milk, cream, half-and-half, and cream cheese. Whole-fat or sweetened yogurt. Full-fat cheeses or blue cheese. Non-dairy creamers and whipped toppings. Processed cheese, cheese spreads, or cheese curds.  Condiments Onion and garlic salt, seasoned salt, table salt, and sea salt. Canned and packaged gravies. Worcestershire sauce. Tartar sauce. Barbecue sauce. Teriyaki sauce. Soy sauce, including reduced sodium. Steak sauce. Fish sauce. Oyster sauce. Cocktail sauce. Horseradish. Ketchup and mustard. Meat flavorings and tenderizers. Bouillon cubes. Hot sauce. Tabasco sauce. Marinades. Taco seasonings. Relishes.  Fats and Oils Butter, stick margarine, lard, shortening and bacon fat. Coconut, palm kernel, or palm oils. Regular salad dressings.  Pickles and olives. Salted popcorn and pretzels.  The items listed above may not be a complete list of foods and beverages to avoid.

## 2023-02-16 ENCOUNTER — Encounter: Payer: Self-pay | Admitting: Internal Medicine

## 2023-02-16 ENCOUNTER — Ambulatory Visit: Payer: BC Managed Care – PPO | Admitting: Internal Medicine

## 2023-02-16 VITALS — BP 128/80 | HR 76 | Temp 97.9°F | Resp 16 | Ht 67.0 in | Wt 170.2 lb

## 2023-02-16 DIAGNOSIS — F5101 Primary insomnia: Secondary | ICD-10-CM

## 2023-02-16 DIAGNOSIS — E611 Iron deficiency: Secondary | ICD-10-CM | POA: Diagnosis not present

## 2023-02-16 DIAGNOSIS — Z79899 Other long term (current) drug therapy: Secondary | ICD-10-CM

## 2023-02-16 DIAGNOSIS — E538 Deficiency of other specified B group vitamins: Secondary | ICD-10-CM

## 2023-02-16 DIAGNOSIS — I10 Essential (primary) hypertension: Secondary | ICD-10-CM | POA: Diagnosis not present

## 2023-02-16 DIAGNOSIS — R7309 Other abnormal glucose: Secondary | ICD-10-CM

## 2023-02-16 DIAGNOSIS — E782 Mixed hyperlipidemia: Secondary | ICD-10-CM

## 2023-02-16 DIAGNOSIS — E559 Vitamin D deficiency, unspecified: Secondary | ICD-10-CM

## 2023-02-16 MED ORDER — TRAZODONE HCL 150 MG PO TABS
ORAL_TABLET | ORAL | 3 refills | Status: DC
Start: 2023-02-16 — End: 2023-03-23

## 2023-02-17 LAB — COMPLETE METABOLIC PANEL WITH GFR
AG Ratio: 1.8 (calc) (ref 1.0–2.5)
ALT: 14 U/L (ref 9–46)
AST: 20 U/L (ref 10–35)
Albumin: 4.4 g/dL (ref 3.6–5.1)
Alkaline phosphatase (APISO): 80 U/L (ref 35–144)
BUN: 13 mg/dL (ref 7–25)
CO2: 28 mmol/L (ref 20–32)
Calcium: 9.6 mg/dL (ref 8.6–10.3)
Chloride: 104 mmol/L (ref 98–110)
Creat: 1.21 mg/dL (ref 0.70–1.30)
Globulin: 2.5 g/dL (calc) (ref 1.9–3.7)
Glucose, Bld: 90 mg/dL (ref 65–99)
Potassium: 4.3 mmol/L (ref 3.5–5.3)
Sodium: 140 mmol/L (ref 135–146)
Total Bilirubin: 0.5 mg/dL (ref 0.2–1.2)
Total Protein: 6.9 g/dL (ref 6.1–8.1)
eGFR: 70 mL/min/{1.73_m2} (ref >=60)

## 2023-02-17 LAB — CBC WITH DIFFERENTIAL/PLATELET
Absolute Monocytes: 419 cells/uL (ref 200–950)
Basophils Absolute: 74 cells/uL (ref 0–200)
Basophils Relative: 0.8 %
Eosinophils Absolute: 381 cells/uL (ref 15–500)
Eosinophils Relative: 4.1 %
HCT: 45.9 % (ref 38.5–50.0)
Hemoglobin: 15.8 g/dL (ref 13.2–17.1)
Lymphs Abs: 1879 cells/uL (ref 850–3900)
MCH: 28.7 pg (ref 27.0–33.0)
MCHC: 34.4 g/dL (ref 32.0–36.0)
MCV: 83.3 fL (ref 80.0–100.0)
MPV: 10.8 fL (ref 7.5–12.5)
Monocytes Relative: 4.5 %
Neutro Abs: 6547 cells/uL (ref 1500–7800)
Neutrophils Relative %: 70.4 %
Platelets: 207 10*3/uL (ref 140–400)
RBC: 5.51 10*6/uL (ref 4.20–5.80)
RDW: 14.4 % (ref 11.0–15.0)
Total Lymphocyte: 20.2 %
WBC: 9.3 10*3/uL (ref 3.8–10.8)

## 2023-02-17 LAB — MAGNESIUM: Magnesium: 2.2 mg/dL (ref 1.5–2.5)

## 2023-02-17 LAB — LIPID PANEL
Cholesterol: 291 mg/dL — ABNORMAL HIGH (ref <200)
HDL: 39 mg/dL — ABNORMAL LOW (ref >=40)
LDL Cholesterol (Calc): 219 mg/dL (calc) — ABNORMAL HIGH
Non-HDL Cholesterol (Calc): 252 mg/dL (calc) — ABNORMAL HIGH (ref <130)
Total CHOL/HDL Ratio: 7.5 (calc) — ABNORMAL HIGH (ref <5.0)
Triglycerides: 164 mg/dL — ABNORMAL HIGH (ref <150)

## 2023-02-17 LAB — VITAMIN D 25 HYDROXY (VIT D DEFICIENCY, FRACTURES): Vit D, 25-Hydroxy: 38 ng/mL (ref 30–100)

## 2023-02-17 LAB — VITAMIN B12: Vitamin B-12: 534 pg/mL (ref 200–1100)

## 2023-02-17 LAB — IRON, TOTAL/TOTAL IRON BINDING CAP
%SAT: 23 % (calc) (ref 20–48)
Iron: 83 ug/dL (ref 50–180)
TIBC: 354 mcg/dL (calc) (ref 250–425)

## 2023-02-17 LAB — HEMOGLOBIN A1C
Hgb A1c MFr Bld: 5.4 % of total Hgb (ref <5.7)
Mean Plasma Glucose: 108 mg/dL
eAG (mmol/L): 6 mmol/L

## 2023-02-17 LAB — INSULIN, RANDOM: Insulin: 5 u[IU]/mL

## 2023-02-17 LAB — TSH: TSH: 0.88 mIU/L (ref 0.40–4.50)

## 2023-02-17 NOTE — Progress Notes (Signed)
^<^<^<^<^<^<^<^<^<^<^<^<^<^<^<^<^<^<^<^<^<^<^<^<^<^<^<^<^<^<^<^<^<^<^<^<^ ^>^>^>^>^>^>^>^>^>^>^>>^>^>^>^>^>^>^>^>^>^>^>^>^>^>^>^>^>^>^>^>^>^>^>^>^>  -   Cholesterol  = 291 And LDL = 219 - is EXTREMELY HIGH risk for                                                                                                     Heart Attack or Stroke   - Recommend a referral to se Dr Rennis Golden , Cardiologist in charge of the [facility] for   an opinion of what other meds to try, since you decline to take a Statin   - If you are agreeable, I will put in a referral   ^<^<^<^<^<^<^<^<^<^<^<^<^<^<^<^<^<^<^<^<^<^<^<^<^<^<^<^<^<^<^<^<^<^<^<^<^ ^>^>^>^>^>^>^>^>^>^>^>^>^>^>^>^>^>^>^>^>^>^>^>^>^>^>^>^>^>^>^>^>^>^>^>^>^  -  A1c - Normal - No Diabetes  - Great !   ^<^<^<^<^<^<^<^<^<^<^<^<^<^<^<^<^<^<^<^<^<^<^<^<^<^<^<^<^<^<^<^<^<^<^<^<^ ^>^>^>^>^>^>^>^>^>^>^>^>^>^>^>^>^>^>^>^>^>^>^>^>^>^>^>^>^>^>^>^>^>^>^>^>^  -  Vitamin D = 38 - is very low   - Vitamin D goal is between 70-100.   - Please Take Vitamin D 5,000 unit capsules x 2 capsules = 10,000 units every day !  - It is very important as a natural anti-inflammatory and helping the                       immune system protect against viral infections, like the Covid-19   - helping hair, skin, and nails, as well as reducing stroke and heart attack risk.   - It helps your bones and helps with mood.  - It also decreases numerous cancer risks so please                                                                                           take it as directed.   - Low Vit D is associated with a 200-300% higher risk for CANCER   and 200-300% higher risk for HEART   ATTACK  &  STROKE.    - It is also associated with higher death rate at younger ages,   autoimmune diseases like Rheumatoid arthritis, Lupus, Multiple Sclerosis.     - Also many other serious conditions, like depression, Alzheimer's  Dementia,  muscle aches, fatigue, fibromyalgia    ^<^<^<^<^<^<^<^<^<^<^<^<^<^<^<^<^<^<^<^<^<^<^<^<^<^<^<^<^<^<^<^<^<^<^<^<^ ^>^>^>^>^>^>^>^>^>^>^>^>^>^>^>^>^>^>^>^>^>^>^>^>^>^>^>^>^>^>^>^>^>^>^>^>^  -  Iron & Vitamin B12 levels are Normal & OK   ^<^<^<^<^<^<^<^<^<^<^<^<^<^<^<^<^<^<^<^<^<^<^<^<^<^<^<^<^<^<^<^<^<^<^<^<^ ^>^>^>^>^>^>^>^>^>^>^>^>^>^>^>^>^>^>^>^>^>^>^>^>^>^>^>^>^>^>^>^>^>^>^>^>^  -  All Else - CBC - Kidneys - Electrolytes - Liver - Magnesium & Thyroid    - all  Normal / OK  ^<^<^<^<^<^<^<^<^<^<^<^<^<^<^<^<^<^<^<^<^<^<^<^<^<^<^<^<^<^<^<^<^<^<^<^<^ ^>^>^>^>^>^>^>^>^>^>^>^>^>^>^>^>^>^>^>^>^>^>^>^>^>^>^>^>^>^>^>^>^>^>^>^>^  -

## 2023-02-19 ENCOUNTER — Encounter: Payer: Self-pay | Admitting: Internal Medicine

## 2023-03-16 ENCOUNTER — Other Ambulatory Visit: Payer: Self-pay

## 2023-03-16 ENCOUNTER — Encounter (HOSPITAL_BASED_OUTPATIENT_CLINIC_OR_DEPARTMENT_OTHER): Payer: Self-pay | Admitting: Emergency Medicine

## 2023-03-16 ENCOUNTER — Observation Stay (HOSPITAL_BASED_OUTPATIENT_CLINIC_OR_DEPARTMENT_OTHER)
Admission: EM | Admit: 2023-03-16 | Discharge: 2023-03-17 | Disposition: A | Discharge status: Home / Self Care | Payer: BC Managed Care – PPO | Attending: Internal Medicine | Admitting: Internal Medicine

## 2023-03-16 ENCOUNTER — Emergency Department (HOSPITAL_BASED_OUTPATIENT_CLINIC_OR_DEPARTMENT_OTHER): Payer: BC Managed Care – PPO

## 2023-03-16 ENCOUNTER — Emergency Department (HOSPITAL_BASED_OUTPATIENT_CLINIC_OR_DEPARTMENT_OTHER): Payer: BC Managed Care – PPO | Admitting: Radiology

## 2023-03-16 DIAGNOSIS — Z7982 Long term (current) use of aspirin: Secondary | ICD-10-CM | POA: Insufficient documentation

## 2023-03-16 DIAGNOSIS — F17210 Nicotine dependence, cigarettes, uncomplicated: Secondary | ICD-10-CM | POA: Insufficient documentation

## 2023-03-16 DIAGNOSIS — R739 Hyperglycemia, unspecified: Secondary | ICD-10-CM | POA: Diagnosis not present

## 2023-03-16 DIAGNOSIS — I10 Essential (primary) hypertension: Secondary | ICD-10-CM | POA: Diagnosis not present

## 2023-03-16 DIAGNOSIS — I771 Stricture of artery: Secondary | ICD-10-CM | POA: Diagnosis not present

## 2023-03-16 DIAGNOSIS — I700 Atherosclerosis of aorta: Secondary | ICD-10-CM | POA: Diagnosis not present

## 2023-03-16 DIAGNOSIS — Z79899 Other long term (current) drug therapy: Secondary | ICD-10-CM | POA: Diagnosis not present

## 2023-03-16 DIAGNOSIS — R0789 Other chest pain: Principal | ICD-10-CM | POA: Insufficient documentation

## 2023-03-16 DIAGNOSIS — R079 Chest pain, unspecified: Principal | ICD-10-CM | POA: Diagnosis present

## 2023-03-16 LAB — D-DIMER, QUANTITATIVE: D-Dimer, Quant: 0.59 ug/mL-FEU — ABNORMAL HIGH (ref 0.00–0.50)

## 2023-03-16 LAB — TROPONIN I (HIGH SENSITIVITY)
Troponin I (High Sensitivity): 3 ng/L (ref <18)
Troponin I (High Sensitivity): 3 ng/L (ref <18)

## 2023-03-16 LAB — CBC
HCT: 40.9 % (ref 39.0–52.0)
Hemoglobin: 14.4 g/dL (ref 13.0–17.0)
MCH: 29 pg (ref 26.0–34.0)
MCHC: 35.2 g/dL (ref 30.0–36.0)
MCV: 82.5 fL (ref 80.0–100.0)
Platelets: 206 10*3/uL (ref 150–400)
RBC: 4.96 MIL/uL (ref 4.22–5.81)
RDW: 12.5 % (ref 11.5–15.5)
WBC: 8.7 10*3/uL (ref 4.0–10.5)
nRBC: 0 % (ref 0.0–0.2)

## 2023-03-16 LAB — BASIC METABOLIC PANEL
Anion gap: 9 (ref 5–15)
BUN: 16 mg/dL (ref 6–20)
CO2: 27 mmol/L (ref 22–32)
Calcium: 9.5 mg/dL (ref 8.9–10.3)
Chloride: 102 mmol/L (ref 98–111)
Creatinine, Ser: 1.32 mg/dL — ABNORMAL HIGH (ref 0.61–1.24)
GFR, Estimated: >60 mL/min (ref >60)
Glucose, Bld: 147 mg/dL — ABNORMAL HIGH (ref 70–99)
Potassium: 3.7 mmol/L (ref 3.5–5.1)
Sodium: 138 mmol/L (ref 135–145)

## 2023-03-16 MED ORDER — NITROGLYCERIN 0.4 MG SL SUBL
0.4000 mg | SUBLINGUAL_TABLET | SUBLINGUAL | Status: AC
  Administered 2023-03-16: 0.4 mg via SUBLINGUAL
  Filled 2023-03-16: qty 1

## 2023-03-16 MED ORDER — ACETAMINOPHEN 325 MG PO TABS: 650.0000 mg | ORAL_TABLET | Freq: Four times a day (QID) | ORAL | Status: DC | PRN

## 2023-03-16 MED ORDER — MELATONIN 5 MG PO TABS: 5.0000 mg | ORAL_TABLET | Freq: Every evening | ORAL | Status: DC | PRN

## 2023-03-16 MED ORDER — SODIUM CHLORIDE 0.9 % IV BOLUS
500.0000 mL | Freq: Once | INTRAVENOUS | Status: AC
  Administered 2023-03-16: 500 mL via INTRAVENOUS

## 2023-03-16 MED ORDER — IOHEXOL 350 MG/ML SOLN
100.0000 mL | Freq: Once | INTRAVENOUS | Status: AC | PRN
  Administered 2023-03-16: 75 mL via INTRAVENOUS

## 2023-03-16 MED ORDER — ONDANSETRON HCL 4 MG/2ML IJ SOLN
4.0000 mg | Freq: Once | INTRAMUSCULAR | Status: AC
  Administered 2023-03-16: 4 mg via INTRAVENOUS
  Filled 2023-03-16: qty 2

## 2023-03-16 MED ORDER — PROCHLORPERAZINE EDISYLATE 10 MG/2ML IJ SOLN: 5.0000 mg | Freq: Four times a day (QID) | INTRAMUSCULAR | Status: DC | PRN

## 2023-03-16 MED ORDER — POLYETHYLENE GLYCOL 3350 17 G PO PACK: 17.0000 g | PACK | Freq: Every day | ORAL | Status: DC | PRN

## 2023-03-16 MED ORDER — ENOXAPARIN SODIUM 40 MG/0.4ML IJ SOSY
40.0000 mg | PREFILLED_SYRINGE | Freq: Every day | INTRAMUSCULAR | Status: DC
  Administered 2023-03-17: 40 mg via SUBCUTANEOUS
  Filled 2023-03-16: qty 0.4

## 2023-03-16 MED ORDER — EZETIMIBE 10 MG PO TABS
10.0000 mg | ORAL_TABLET | Freq: Every day | ORAL | Status: DC
  Administered 2023-03-17: 10 mg via ORAL
  Filled 2023-03-16: qty 1

## 2023-03-16 NOTE — Plan of Care (Signed)

## 2023-03-16 NOTE — ED Notes (Signed)
POV to [facility] cone for admit.

## 2023-03-16 NOTE — ED Triage Notes (Signed)
Pt arrives pov, steady gait with c/o radiating CP to bilateral shoulders and hands, and dizziness starting yesterday. Also reports shob and nausea

## 2023-03-16 NOTE — Progress Notes (Signed)
Hospitalist Transfer Note:  Transferring facility: [facility] Requesting provider: Solon Augusta, PA (EDP at [facility]) Reason for transfer: admission for further evaluation and management of chest pain.    57 y.o. male with history of hypertension, hyperlipidemia, prediabetes, chronic tobacco abuse,who presented to [facility] ED complaining of 1 day of nearly constant left-sided chest pressure with radiation into the left shoulder as well as the bilateral jaw, with exacerbation of chest pain with exertion.  This has been associated with some dyspnea on exertion.  He notes that he has been experiencing some intermittent dyspnea on exertion as well as intermittent nausea over the course the last week, but conveys that the actual chest discomfort did not start until the morning of 03/15/2023.  Not associate with any vomiting.  He took a full dose aspirin x 1 prior to presenting to the emergency department this evening.  Chest pain has improved significantly following 2 doses of sublingual nitroglycerin, with the patient now noting very mild residual chest discomfort.  Will he has no known history of coronary artery disease, he has not recently undergone coronary ischemic evaluation.  He conveys a history of hypertension, hyperlipidemia, prediabetes as well as chronic tobacco abuse.  His mother passed away from an acute MI at the age of 85.  Troponin x 2 are reported to be flat and nonelevated.  EKG shows sinus rhythm without overt evidence of acute ischemic changes, including no evidence of ST elevation.  Chest x-ray is reported to show no evidence of acute process.  Subsequently, I accepted this patient for transfer for observation admission to a cardiac telemetry bed at [facility] for further work-up and management of the above.       Nursing staff, Please call [facility] Admits & Consults System-Wide number on Amion [phone]) as soon as patient's arrival, so appropriate admitting provider can evaluate the pt.     Newton Pigg, DO Hospitalist

## 2023-03-16 NOTE — ED Notes (Signed)
Patient given nitroglycerin sl for chest pain 3/10.  States chest pain relieved.

## 2023-03-16 NOTE — ED Provider Notes (Signed)
[place] EMERGENCY DEPARTMENT AT [facility] Provider Note   CSN: [id number] Arrival date & time: 03/16/23  1350     History  Chief Complaint  Patient presents with   Chest Pain    Grant Campbell is a 58 y.o. male.   Chest Pain  Patient is 57 year old male with past medical history significant for 40 years of 1 pack/day smoking, his mother died at 46 years old from MI, high cholesterol, hypertension, prediabetes  Patient presents emergency room today with complaints of sternal chest pain that is a pressure that seems to radiate to his left shoulder, left jaw, he feels somewhat nauseated has felt somewhat short of breath as well.  He states that his pain began yesterday morning and has been constant since although it seems to wax and wane.  Does seem to be worse with walking.  He states he has felt more short of breath, has had some nausea although he has not vomited. He denies any chest trauma.  No cough or hemoptysis fevers or chills No recent surgeries, hospitalization, long travel, hemoptysis, estrogen containing OCP, cancer history.  No unilateral leg swelling.  No history of PE or VTE.       Home Medications Prior to Admission medications   Medication Sig Start Date End Date Taking? Authorizing Provider  ALPRAZolam (XANAX) 1 MG tablet TAKE 1/2 TO 1 TABLET 2-3 times a DAY ONLY AS NEEDED FOR ANXIETY attack. limit TO FIVE days/week TO AVOID addiction AND dementia 02/10/23   Raynelle Dick, NP  Aspirin-Acetaminophen-Caffeine (GOODY HEADACHE PO) Take 1 Package by mouth 2 (two) times daily as needed (for pain).    [provider]  CINNAMON PO Take 1 capsule by mouth daily.    [provider]  Cyanocobalamin (B-12 PO) Take 1 tablet by mouth daily.    [provider]  diclofenac sodium (VOLTAREN) 1 % GEL Apply 4 g topically 4 (four) times daily. Bilateral shoulders Patient taking differently: Apply 4 g topically 4 (four) times daily as needed  (shoulder pain). 02/17/18   Kirtland Bouchard, PA-C  ELDERBERRY PO Take by mouth daily.    [provider]  ezetimibe (ZETIA) 10 MG tablet TAKE ONE TABLET DAILY FOR CHOLESTEROL 11/13/22   Raynelle Dick, NP  losartan (COZAAR) 100 MG tablet TAKE ONE TABLET BY MOUTH DAILY FOR blood pressure 01/20/23   Raynelle Dick, NP  MAGNESIUM PO Take 1 capsule by mouth daily.    [provider]  naproxen sodium (ALEVE) 220 MG tablet Take 440 mg by mouth daily as needed (pain relief).    [provider]  sildenafil (VIAGRA) 100 MG tablet TAKE 1/2 TO 1 TABLET AS NEEDED. Patient taking differently: Take 50-100 mg by mouth as needed for erectile dysfunction. 03/17/22   Raynelle Dick, NP  traZODone (DESYREL) 150 MG tablet Take 1/2 to 1 tablet 1 to 2 hours before Bedtime for Sleep 02/16/23   [name], MD  VITAMIN D PO Take 1 capsule by mouth. Does not take every day    [provider]  Zinc 50 MG TABS Take by mouth daily.    [provider]      Allergies    Lisinopril, Olmesartan, and Vicodin [hydrocodone-acetaminophen]    Review of Systems   Review of Systems  Cardiovascular:  Positive for chest pain.    Physical Exam Updated Vital Signs BP (!) 144/88 (BP Location: Right Arm)   Pulse (!) 58   Temp 97.7  F (36.5 C)   Resp 18   Ht 5\' 8"  (1.727 m)   Wt 72.6 kg   SpO2 100%   BMI 24.33 kg/m  Physical Exam Vitals and nursing note reviewed.  Constitutional:      General: He is not in acute distress. HENT:     Head: Normocephalic and atraumatic.     Nose: Nose normal.     Mouth/Throat:     Mouth: Mucous membranes are moist.  Eyes:     General: No scleral icterus. Cardiovascular:     Rate and Rhythm: Normal rate and regular rhythm.     Pulses: Normal pulses.     Heart sounds: Normal heart sounds.     Comments: BL radial artery pulses 3+ and symmetric Pulmonary:     Effort: Pulmonary effort is normal. No respiratory distress.     Breath  sounds: No wheezing.  Abdominal:     Palpations: Abdomen is soft.     Tenderness: There is no abdominal tenderness.  Musculoskeletal:     Cervical back: Normal range of motion.     Right lower leg: No edema.     Left lower leg: No edema.  Skin:    General: Skin is warm and dry.     Capillary Refill: Capillary refill takes less than 2 seconds.  Neurological:     Mental Status: He is alert. Mental status is at baseline.  Psychiatric:        Mood and Affect: Mood normal.        Behavior: Behavior normal.     ED Results / Procedures / Treatments   Labs (all labs ordered are listed, but only abnormal results are displayed) Labs Reviewed  BASIC METABOLIC PANEL - Abnormal; Notable for the following components:      Result Value   Glucose, Bld 147 (*)    Creatinine, Ser 1.32 (*)    All other components within normal limits  CBC  TROPONIN I (HIGH SENSITIVITY)  TROPONIN I (HIGH SENSITIVITY)    EKG EKG Interpretation Date/Time:  Monday March 16 2023 14:02:00 EDT Ventricular Rate:  67 PR Interval:  146 QRS Duration:  104 QT Interval:  374 QTC Calculation: 395 R Axis:   6  Text Interpretation: Normal sinus rhythm Normal ECG When compared with ECG of 12-Jul-2022 08:55, PREVIOUS ECG IS PRESENT Confirmed by Ernie Avena ([id number]) on 03/16/2023 3:22:19 PM  Radiology DG Chest 2 View  Result Date: 03/16/2023 CLINICAL DATA:  Chest pain. EXAM: CHEST - 2 VIEW COMPARISON:  None Available. FINDINGS: Tortuosity of the aorta. Cardiomediastinal silhouette is normal. Mediastinal contours appear intact. There is no evidence of focal airspace consolidation, pleural effusion or pneumothorax. Osseous structures are without acute abnormality. Soft tissues are grossly normal. IMPRESSION: No active cardiopulmonary disease. Electronically Signed   By: Ted Mcalpine M.D.   On: 03/16/2023 15:15    Procedures Procedures    Medications Ordered in ED Medications  nitroGLYCERIN (NITROSTAT) SL tablet  0.4 mg (has no administration in time range)  sodium chloride 0.9 % bolus 500 mL (has no administration in time range)  ondansetron (ZOFRAN) injection 4 mg (has no administration in time range)    ED Course/ Medical Decision Making/ A&P Clinical Course as of 03/16/23 1757  Mon Mar 16, 2023  1728 CP started yestday AM Pressure, sternal, rads to shoulders, nauseated, SOB [WF]    Clinical Course User Index [WF] Gailen Shelter, [place]  Medical Decision Making Amount and/or Complexity of Data Reviewed Labs: ordered. Radiology: ordered.  Risk Prescription drug management. Decision regarding hospitalization.   This patient presents to the ED for concern of CP, this involves a number of treatment options, and is a complaint that carries with it a high risk of complications and morbidity. A differential diagnosis was considered for the patient's symptoms which is discussed below:   The emergent causes of chest pain include: Acute coronary syndrome, tamponade, pericarditis/myocarditis, aortic dissection, pulmonary embolism, tension pneumothorax, pneumonia, and esophageal rupture.    Co morbidities: Discussed in HPI   Brief History:  Patient is 57 year old male with past medical history significant for 40 years of 1 pack/day smoking, his mother died at 76 years old from MI, high cholesterol, hypertension, prediabetes  Patient presents emergency room today with complaints of sternal chest pain that is a pressure that seems to radiate to his left shoulder, left jaw, he feels somewhat nauseated has felt somewhat short of breath as well.  He states that his pain began yesterday morning and has been constant since although it seems to wax and wane.  Does seem to be worse with walking.  He states he has felt more short of breath, has had some nausea although he has not vomited. He denies any chest trauma.  No cough or hemoptysis fevers or chills No recent surgeries,  hospitalization, long travel, hemoptysis, estrogen containing OCP, cancer history.  No unilateral leg swelling.  No history of PE or VTE.     EMR reviewed including pt PMHx, past surgical history and past visits to ER.   See HPI for more details   Lab Tests:   I personally reviewed all laboratory work and imaging. Metabolic panel without any acute abnormality specifically kidney function within normal limits and no significant electrolyte abnormalities. CBC without leukocytosis or significant anemia. BMP unremarkable, CBC without leukocytosis or anemia, troponin x 1 within normal limits  Imaging Studies:  NAD. I personally reviewed all imaging studies and no acute abnormality found. I agree with radiology interpretation.    Cardiac Monitoring:  The patient was maintained on a cardiac monitor.  I personally viewed and interpreted the cardiac monitored which showed an underlying rhythm of: NSR EKG non-ischemic   Medicines ordered:  I ordered medication including Zofran, for nausea, nitroglycerin for chest pain Reevaluation of the patient after these medicines showed that the patient significantly improved.  I have reviewed the patients home medicines and have made adjustments as needed   Critical Interventions:     Consults/Attending Physician   I discussed this case with my attending physician who cosigned this note including patient's presenting symptoms, physical exam, and planned diagnostics and interventions. Attending physician stated agreement with plan or made changes to plan which were implemented.   Reevaluation:  After the interventions noted above I re-evaluated patient and found that they have :improved   Social Determinants of Health:      Problem List / ED Course:  Sternal chest pain that seems to radiate to bilateral jaw and is associated with nausea and some dyspnea endorses some fatigue as well.  He endorses a exertional component to his chest  pain.  He has been constant since yesterday and dramatically improved over 60% with nitroglycerin sublingual.  Negative PE scan overall reassuring workup however given his exertional chest pain and multiple risk factors and heart score of 4 will admit to medicine.  Dr. However admitted.   Dispostion:  After consideration of the diagnostic  results and the patients response to treatment, I feel that the patent would benefit from    Final Clinical Impression(s) / ED Diagnoses Final diagnoses:  None    Rx / DC Orders ED Discharge Orders     None         Gailen Shelter, [place] 03/16/23 2254    Gwyneth Sprout, MD 03/18/23 [phone]

## 2023-03-17 ENCOUNTER — Observation Stay (HOSPITAL_COMMUNITY): Payer: BC Managed Care – PPO

## 2023-03-17 ENCOUNTER — Encounter (HOSPITAL_COMMUNITY)
Admission: EM | Disposition: A | Discharge status: Home / Self Care | Payer: Self-pay | Source: Home / Self Care | Attending: Emergency Medicine

## 2023-03-17 DIAGNOSIS — R079 Chest pain, unspecified: Secondary | ICD-10-CM

## 2023-03-17 HISTORY — PX: LEFT HEART CATH AND CORONARY ANGIOGRAPHY: CATH118249

## 2023-03-17 LAB — ECHOCARDIOGRAM COMPLETE
Area-P 1/2: 2.87 cm2
Calc EF: 54.3 %
Height: 68 in
S' Lateral: 3.2 cm
Single Plane A2C EF: 59 %
Single Plane A4C EF: 55.2 %
Weight: 2516.8 oz

## 2023-03-17 LAB — HEMOGLOBIN A1C
Hgb A1c MFr Bld: 5.9 % — ABNORMAL HIGH (ref 4.8–5.6)
Mean Plasma Glucose: 122.63 mg/dL

## 2023-03-17 LAB — BASIC METABOLIC PANEL
Anion gap: 7 (ref 5–15)
BUN: 14 mg/dL (ref 6–20)
CO2: 26 mmol/L (ref 22–32)
Calcium: 8.7 mg/dL — ABNORMAL LOW (ref 8.9–10.3)
Chloride: 106 mmol/L (ref 98–111)
Creatinine, Ser: 1.16 mg/dL (ref 0.61–1.24)
GFR, Estimated: >60 mL/min (ref >60)
Glucose, Bld: 99 mg/dL (ref 70–99)
Potassium: 3.9 mmol/L (ref 3.5–5.1)
Sodium: 139 mmol/L (ref 135–145)

## 2023-03-17 LAB — LIPID PANEL
Cholesterol: 168 mg/dL (ref 0–200)
HDL: 30 mg/dL — ABNORMAL LOW (ref >40)
LDL Cholesterol: 118 mg/dL — ABNORMAL HIGH (ref 0–99)
Total CHOL/HDL Ratio: 5.6 RATIO
Triglycerides: 100 mg/dL (ref <150)
VLDL: 20 mg/dL (ref 0–40)

## 2023-03-17 LAB — CBC
HCT: 39.3 % (ref 39.0–52.0)
HCT: 37.9 % — ABNORMAL LOW (ref 39.0–52.0)
Hemoglobin: 13.7 g/dL (ref 13.0–17.0)
Hemoglobin: 13.2 g/dL (ref 13.0–17.0)
MCH: 29.3 pg (ref 26.0–34.0)
MCH: 29.5 pg (ref 26.0–34.0)
MCHC: 34.9 g/dL (ref 30.0–36.0)
MCHC: 34.8 g/dL (ref 30.0–36.0)
MCV: 84.2 fL (ref 80.0–100.0)
MCV: 84.6 fL (ref 80.0–100.0)
Platelets: 195 10*3/uL (ref 150–400)
Platelets: 175 10*3/uL (ref 150–400)
RBC: 4.67 MIL/uL (ref 4.22–5.81)
RBC: 4.48 MIL/uL (ref 4.22–5.81)
RDW: 12.9 % (ref 11.5–15.5)
RDW: 12.6 % (ref 11.5–15.5)
WBC: 7.2 10*3/uL (ref 4.0–10.5)
WBC: 7.4 10*3/uL (ref 4.0–10.5)
nRBC: 0 % (ref 0.0–0.2)
nRBC: 0 % (ref 0.0–0.2)

## 2023-03-17 LAB — PHOSPHORUS: Phosphorus: 3.7 mg/dL (ref 2.5–4.6)

## 2023-03-17 LAB — CREATININE, SERUM
Creatinine, Ser: 1.13 mg/dL (ref 0.61–1.24)
GFR, Estimated: >60 mL/min (ref >60)

## 2023-03-17 LAB — MAGNESIUM: Magnesium: 2.2 mg/dL (ref 1.7–2.4)

## 2023-03-17 LAB — HIV ANTIBODY (ROUTINE TESTING W REFLEX): HIV Screen 4th Generation wRfx: NONREACTIVE

## 2023-03-17 SURGERY — LEFT HEART CATH AND CORONARY ANGIOGRAPHY
Anesthesia: LOCAL

## 2023-03-17 MED ORDER — MIDAZOLAM HCL 2 MG/2ML IJ SOLN
INTRAMUSCULAR | Status: AC
  Filled 2023-03-17: qty 2

## 2023-03-17 MED ORDER — SODIUM CHLORIDE 0.9 % WEIGHT BASED INFUSION: 3.0000 mL/kg/h | INTRAVENOUS | Status: DC

## 2023-03-17 MED ORDER — MIDAZOLAM HCL 2 MG/2ML IJ SOLN
INTRAMUSCULAR | Status: DC | PRN
  Administered 2023-03-17: 1 mg via INTRAVENOUS

## 2023-03-17 MED ORDER — SODIUM CHLORIDE 0.9 % WEIGHT BASED INFUSION: 1.0000 mL/kg/h | INTRAVENOUS | Status: DC

## 2023-03-17 MED ORDER — VERAPAMIL HCL 2.5 MG/ML IV SOLN
INTRAVENOUS | Status: DC | PRN
  Administered 2023-03-17: 10 mL via INTRA_ARTERIAL

## 2023-03-17 MED ORDER — VERAPAMIL HCL 2.5 MG/ML IV SOLN
INTRAVENOUS | Status: AC
  Filled 2023-03-17: qty 2

## 2023-03-17 MED ORDER — LIDOCAINE HCL (PF) 1 % IJ SOLN
INTRAMUSCULAR | Status: DC | PRN
  Administered 2023-03-17: 2 mL via INTRADERMAL

## 2023-03-17 MED ORDER — SODIUM CHLORIDE 0.9 % IV SOLN: INTRAVENOUS | Status: DC

## 2023-03-17 MED ORDER — SODIUM CHLORIDE 0.9 % WEIGHT BASED INFUSION
3.0000 mL/kg/h | INTRAVENOUS | Status: DC
  Administered 2023-03-17: 3 mL/kg/h via INTRAVENOUS

## 2023-03-17 MED ORDER — HEPARIN SODIUM (PORCINE) 1000 UNIT/ML IJ SOLN
INTRAMUSCULAR | Status: AC
  Filled 2023-03-17: qty 10

## 2023-03-17 MED ORDER — FENTANYL CITRATE (PF) 100 MCG/2ML IJ SOLN
INTRAMUSCULAR | Status: AC
  Filled 2023-03-17: qty 2

## 2023-03-17 MED ORDER — ASPIRIN 81 MG PO TBEC: 81.0000 mg | DELAYED_RELEASE_TABLET | Freq: Every day | ORAL | Status: DC

## 2023-03-17 MED ORDER — ALPRAZOLAM 0.5 MG PO TABS: 0.5000 mg | ORAL_TABLET | Freq: Two times a day (BID) | ORAL | Status: DC | PRN

## 2023-03-17 MED ORDER — HEPARIN (PORCINE) IN NACL 1000-0.9 UT/500ML-% IV SOLN
INTRAVENOUS | Status: DC | PRN
  Administered 2023-03-17 (×2): 500 mL

## 2023-03-17 MED ORDER — HEPARIN SODIUM (PORCINE) 1000 UNIT/ML IJ SOLN
INTRAMUSCULAR | Status: DC | PRN
  Administered 2023-03-17: 3500 [IU] via INTRAVENOUS

## 2023-03-17 MED ORDER — ENOXAPARIN SODIUM 40 MG/0.4ML IJ SOSY: 40.0000 mg | PREFILLED_SYRINGE | INTRAMUSCULAR | Status: DC

## 2023-03-17 MED ORDER — ASPIRIN 325 MG PO TABS
325.0000 mg | ORAL_TABLET | Freq: Once | ORAL | Status: AC
  Administered 2023-03-17: 325 mg via ORAL
  Filled 2023-03-17: qty 1

## 2023-03-17 MED ORDER — SODIUM CHLORIDE 0.9% FLUSH: 3.0000 mL | INTRAVENOUS | Status: DC | PRN

## 2023-03-17 MED ORDER — FENTANYL CITRATE (PF) 100 MCG/2ML IJ SOLN
INTRAMUSCULAR | Status: DC | PRN
  Administered 2023-03-17: 25 ug via INTRAVENOUS

## 2023-03-17 MED ORDER — NITROGLYCERIN 0.4 MG SL SUBL: 0.4000 mg | SUBLINGUAL_TABLET | SUBLINGUAL | Status: DC | PRN

## 2023-03-17 MED ORDER — LIDOCAINE HCL (PF) 1 % IJ SOLN
INTRAMUSCULAR | Status: AC
  Filled 2023-03-17: qty 30

## 2023-03-17 MED ORDER — SODIUM CHLORIDE 0.9 % IV SOLN: 250.0000 mL | INTRAVENOUS | Status: DC | PRN

## 2023-03-17 MED ORDER — SODIUM CHLORIDE 0.9% FLUSH: 3.0000 mL | Freq: Two times a day (BID) | INTRAVENOUS | Status: DC

## 2023-03-17 MED ORDER — IOHEXOL 350 MG/ML SOLN
INTRAVENOUS | Status: DC | PRN
  Administered 2023-03-17: 45 mL

## 2023-03-17 MED ORDER — HYDRALAZINE HCL 20 MG/ML IJ SOLN: 10.0000 mg | INTRAMUSCULAR | Status: DC | PRN

## 2023-03-17 MED ORDER — PANTOPRAZOLE SODIUM 40 MG PO TBEC: 40.0000 mg | DELAYED_RELEASE_TABLET | Freq: Every day | ORAL | 0 refills | Status: DC

## 2023-03-17 SURGICAL SUPPLY — 10 items
CATH 5FR JL3.5 JR4 ANG PIG MP (CATHETERS) IMPLANT
DEVICE RAD COMP TR BAND LRG (VASCULAR PRODUCTS) IMPLANT
GLIDESHEATH SLEND SS 6F .021 (SHEATH) IMPLANT
GUIDEWIRE INQWIRE 1.5J.035X260 (WIRE) IMPLANT
INQWIRE 1.5J .035X260CM (WIRE) ×1
KIT HEART LEFT (KITS) ×1 IMPLANT
PACK CARDIAC CATHETERIZATION (CUSTOM PROCEDURE TRAY) ×1 IMPLANT
TRANSDUCER W/STOPCOCK (MISCELLANEOUS) ×1 IMPLANT
TUBING CIL FLEX 10 FLL-RA (TUBING) ×1 IMPLANT
WIRE HI TORQ VERSACORE-J 145CM (WIRE) IMPLANT

## 2023-03-17 NOTE — Consult Note (Addendum)
Cardiology Consultation   Patient ID: WASIL WOLKE MRN: [id number]; DOB: 01-19-1966  Admit date: 03/16/2023 Date of Consult: 03/17/2023  PCP:  [name], MD   [facility] Providers Cardiologist:  None   {  Patient Profile:   MACAI SISNEROS is a 57 y.o. male with a hypertension, hx of hyperlipidemia on Zetia, intolerant to statins, current smoker,  who is being seen 03/17/2023 for the evaluation of chest pain at the request of Dr. Willia Craze.  History of Present Illness:   Mr. Rog has no prior cardiac history.  Family history significant for father who died at the age of 62 due to MI, father who also died at 67 due to MI. Prediabetic, A1c 5.9%.  Currently smokes 1 pack/day for last 40+ years.  Patient is presenting now with worsening complaints of heavy exertional chest pain with radiation to his left shoulder and jaw.  Describes it as 5 out of 10. He states that he has had chronic chest pain for the last 2 years and felt that it was more age-related.  However now, states that the symptoms have worsened for the past 2 days and accompanied with shortness of breath, dizziness, nausea.  He was seen in the emergency room and given nitroglycerin with relief.  Has some slight discomfort now.  His wife is accompanied with him who is very hands-on with patient's care.  Overall patient lives a very healthy lifestyle and works in [employer].  Wife cooks mainly at home.  He admits to eating some ice cream however is going to stop after todays event.  ED workup: No evidence of ischemia on EKG, negative troponins.  CTA negative for PE, mild aortic atherosclerosis.  No significant findings on lab work.   Past Medical History:  Diagnosis Date   Hyperlipidemia    Hypertension    Hypogonadism male    Vitamin D deficiency     Past Surgical History:  Procedure Laterality Date   FINGER SURGERY     LAPAROSCOPIC APPENDECTOMY N/A 07/12/2022   Procedure: APPENDECTOMY LAPAROSCOPIC;   Surgeon: Berna Bue, MD;  Location: [facility] OR;  Service: General;  Laterality: N/A;   ROTATOR CUFF REPAIR       Inpatient Medications: Scheduled Meds:  [START ON 03/18/2023] aspirin EC  81 mg Oral Daily   enoxaparin (LOVENOX) injection  40 mg Subcutaneous Daily   ezetimibe  10 mg Oral Daily   Continuous Infusions:  sodium chloride 75 mL/hr at 03/17/23 0600   PRN Meds: acetaminophen, ALPRAZolam, melatonin, nitroGLYCERIN, polyethylene glycol, prochlorperazine  Allergies:    Allergies  Allergen Reactions   Lisinopril Cough   Olmesartan Other (See Comments)    Weakness, joint pain   Vicodin [Hydrocodone-Acetaminophen] Other (See Comments)    "makes my skin crawl"    Social History:   Social History   Socioeconomic History   Marital status: Married    Spouse name: Not on file   Number of children: Not on file   Years of education: Not on file   Highest education level: Not on file  Occupational History   Not on file  Tobacco Use   Smoking status: Every Day    Current packs/day: 1.00    Types: Cigarettes   Smokeless tobacco: Never  Substance and Sexual Activity   Alcohol use: Not Currently    Alcohol/week: 7.0 standard drinks of alcohol    Types: 7 Standard drinks or equivalent per week    Comment: occasional   Drug use:  No   Sexual activity: Not on file  Other Topics Concern   Not on file  Social History Narrative   Not on file   Social Determinants of Health   Financial Resource Strain: Not on file  Food Insecurity: No Food Insecurity (03/16/2023)   Hunger Vital Sign    Worried About Running Out of Food in the Last Year: Never true    Ran Out of Food in the Last Year: Never true  Transportation Needs: No Transportation Needs (03/16/2023)   PRAPARE - Administrator, Civil Service (Medical): No    Lack of Transportation (Non-Medical): No  Physical Activity: Not on file  Stress: Not on file  Social Connections: Not on file  Intimate Partner  Violence: Not At Risk (03/16/2023)   Humiliation, Afraid, Rape, and Kick questionnaire    Fear of Current or Ex-Partner: No    Emotionally Abused: No    Physically Abused: No    Sexually Abused: No    Family History:   Family History  Problem Relation Age of Onset   Heart attack Mother    Kidney failure Father    Hypertension Brother    Hypothyroidism Brother    Hyperlipidemia Brother      ROS:  Please see the history of present illness.  All other ROS reviewed and negative.     Physical Exam/Data:   Vitals:   03/17/23 0200 03/17/23 0344 03/17/23 0400 03/17/23 0600  BP:  109/83    Pulse:  61    Resp: 15 16 15 16   Temp:  97.7 F (36.5 C)    TempSrc:  Oral    SpO2: 100% 94% 98% 100%  Weight:      Height:        Intake/Output Summary (Last 24 hours) at 03/17/2023 0953 Last data filed at 03/17/2023 0600 Gross per 24 hour  Intake 780 ml  Output --  Net 780 ml      03/16/2023   10:46 PM 03/16/2023    1:58 PM 02/16/2023    9:42 AM  Last 3 Weights  Weight (lbs) 157 lb 4.8 oz 160 lb 170 lb 3.2 oz  Weight (kg) 71.351 kg 72.576 kg 77.202 kg     Body mass index is 23.92 kg/m.  General:  Well nourished, well developed, in no acute distress HEENT: normal Neck: no JVD Vascular: No carotid bruits; Distal pulses 2+ bilaterally Cardiac:  normal S1, S2; RRR; no murmur  Lungs:  clear to auscultation bilaterally, no wheezing, rhonchi or rales  Abd: soft, nontender, no hepatomegaly  Ext: no edema Musculoskeletal:  No deformities, BUE and BLE strength normal and equal Skin: warm and dry  Neuro:  CNs 2-12 intact, no focal abnormalities noted Psych:  Normal affect   EKG:  The EKG was personally reviewed and demonstrates:  EKG shows normal sinus rhythm, heart rate 67.  No acute ST-T wave changes. Telemetry:  Telemetry was personally reviewed and demonstrates: Sinus bradycardia heart 50-60  Relevant CV Studies: Echo pending  Laboratory Data:  High Sensitivity Troponin:    Recent Labs  Lab 03/16/23 1411 03/16/23 1726  TROPONINIHS 3 3     Chemistry Recent Labs  Lab 03/16/23 1411 03/17/23 0531  NA 138 139  K 3.7 3.9  CL 102 106  CO2 27 26  GLUCOSE 147* 99  BUN 16 14  CREATININE 1.32* 1.16  CALCIUM 9.5 8.7*  MG  --  2.2  GFRNONAA >60 >60  ANIONGAP 9 7  No results for input(s): "PROT", "ALBUMIN", "AST", "ALT", "ALKPHOS", "BILITOT" in the last 168 hours. Lipids  Recent Labs  Lab 03/17/23 0531  CHOL 168  TRIG 100  HDL 30*  LDLCALC 118*  CHOLHDL 5.6    Hematology Recent Labs  Lab 03/16/23 1411 03/17/23 0531  WBC 8.7 7.2  RBC 4.96 4.67  HGB 14.4 13.7  HCT 40.9 39.3  MCV 82.5 84.2  MCH 29.0 29.3  MCHC 35.2 34.9  RDW 12.5 12.9  PLT 206 195   Thyroid No results for input(s): "TSH", "FREET4" in the last 168 hours.  BNPNo results for input(s): "BNP", "PROBNP" in the last 168 hours.  DDimer  Recent Labs  Lab 03/16/23 1413  DDIMER 0.59*     Radiology/Studies:  CT Angio Chest PE W/Cm &/Or Wo Cm  Result Date: 03/16/2023 CLINICAL DATA:  Pulmonary embolus suspected EXAM: CT ANGIOGRAPHY CHEST WITH CONTRAST TECHNIQUE: Multidetector CT imaging of the chest was performed using the standard protocol during bolus administration of intravenous contrast. Multiplanar CT image reconstructions and MIPs were obtained to evaluate the vascular anatomy. RADIATION DOSE REDUCTION: This exam was performed according to the departmental dose-optimization program which includes automated exposure control, adjustment of the mA and/or kV according to patient size and/or use of iterative reconstruction technique. CONTRAST:  75mL OMNIPAQUE IOHEXOL 350 MG/ML SOLN COMPARISON:  None Available. FINDINGS: Cardiovascular: No evidence of pulmonary embolus. Normal heart size. No pericardial effusion. Normal caliber thoracic aorta with mild atherosclerotic disease. No coronary artery calcifications. Mediastinum/Nodes: Esophagus and thyroid are unremarkable. No enlarged  lymph nodes seen in the chest. Lungs/Pleura: Central airways are patent. No consolidation, pleural effusion or pneumothorax. Upper Abdomen: No acute abnormality. Musculoskeletal: No chest wall abnormality. No acute or significant osseous findings. Review of the MIP images confirms the above findings. IMPRESSION: 1. No evidence of pulmonary embolus or acute airspace opacity. 2. Mild aortic Atherosclerosis (ICD10-I70.0). Electronically Signed   By: Allegra Lai M.D.   On: 03/16/2023 19:59   DG Chest 2 View  Result Date: 03/16/2023 CLINICAL DATA:  Chest pain. EXAM: CHEST - 2 VIEW COMPARISON:  None Available. FINDINGS: Tortuosity of the aorta. Cardiomediastinal silhouette is normal. Mediastinal contours appear intact. There is no evidence of focal airspace consolidation, pleural effusion or pneumothorax. Osseous structures are without acute abnormality. Soft tissues are grossly normal. IMPRESSION: No active cardiopulmonary disease. Electronically Signed   By: Ted Mcalpine M.D.   On: 03/16/2023 15:15     Assessment and Plan:   Chest pain Patient with very strong family history of early death related to MI in both parents and with multiple high risk factors CAD presenting with worsening complaints of exertional chest pain that has been going on for the last 2 years, worsened in the last 2 days with radiation to left shoulder and neck, with accompanying shortness of breath, nausea, dizziness.  EKG fortunately does not show any ischemic changes.  Troponins negative. Will plan for cardiac catheterization. Was loaded on aspirin Sinus bradycardia prohibits beta-blocker Will order LP(a) Echocardiogram pending  Informed Consent    The risks [stroke (1 in 1000), death (1 in 1000), kidney failure [usually temporary] (1 in 500), bleeding (1 in 200), allergic reaction [possibly serious] (1 in 200)], benefits (diagnostic support and management of coronary artery disease) and alternatives of a cardiac  catheterization were discussed in detail with Mr. Nickson and he is willing to proceed.  Mild aortic atherosclerosis Noted on CTA.  No coronary calcifications.  Hyperlipidemia Intolerant to statins due to myalgias (  has tried rosuvastatin/rosuvastatin) Currently on Zetia 10 mg. LDL, 118.  Would be a good candidate for Repatha.  Hypertension Well-controlled here.  PTA losartan 100 mg  Nicotine dependence Currently smokes 1 pack/day.  Has tried multiple other therapies and did not tolerate Chantix/bupropion.  Would be willing to try patches.  Patient was given [place] quit line information.      Risk Assessment/Risk Scores:   For questions or updates, please contact [facility] Please consult [email or web address] for contact info under    Signed, Abagail Kitchens, PA-C  03/17/2023 9:53 AM

## 2023-03-17 NOTE — Progress Notes (Signed)
  Echocardiogram 2D Echocardiogram has been performed.  Grant Campbell 03/17/2023, 9:01 AM

## 2023-03-17 NOTE — Interval H&P Note (Signed)
History and Physical Interval Note:  03/17/2023 2:00 PM  DEIONDRE HARROWER  has presented today for surgery, with the diagnosis of chest pain.  The various methods of treatment have been discussed with the patient and family. After consideration of risks, benefits and other options for treatment, the patient has consented to  Procedure(s): LEFT HEART CATH AND CORONARY ANGIOGRAPHY (N/A) as a surgical intervention.  The patient's history has been reviewed, patient examined, no change in status, stable for surgery.  I have reviewed the patient's chart and labs.  Questions were answered to the patient's satisfaction.    Cath Lab Visit (complete for each Cath Lab visit)  Clinical Evaluation Leading to the Procedure:   ACS: Yes.    Non-ACS:    Anginal Classification: CCS IV  Anti-ischemic medical therapy: No Therapy  Non-Invasive Test Results: No non-invasive testing performed  Prior CABG: No previous CABG       Theron Arista [facility] 03/17/2023 2:00 PM

## 2023-03-17 NOTE — Discharge Summary (Signed)
Physician Discharge Summary  MOSIE ANGUS UEA:[id number] DOB: 1966/01/04 DOA: 03/16/2023  PCP: [name], MD  Admit date: 03/16/2023 Discharge date: 03/17/2023  Admitted From: Home Disposition:  Home  Discharge Condition:Stable CODE STATUS:FULL Diet recommendation: Heart Healthy  Brief/Interim Summary: Patient is a 57 year old male with history of hyperlipidemia on Zetia, smoker, anxiety who presented here with complaint of nonexertional chest pain.  Chest discomfort is described as pressure-like, radiating to left shoulder and nausea.  Ongoing for last 2 weeks.  On presentation, negative troponin, no evidence of ischemia as well on  ekg.  CT chest was negative for pe.  patient was admitted for suspicious of unstable angina.  Cardiology consulted.  Underwent cardiac cath which showed clean coronary arteries.  Cardiology recommended discharge to home today  Following problems were addressed during the hospitalization:  Chest pain : Presented with nonexertional chest pain.  Risk factors include smoking, hyperlipidemia.  Given aspirin..  Echo showed EF of 60%, no wall motion abnormality.  Troponins have been negative.  Cardiology consulted .  Underwent cardiac cath which showed clean coronary arteries.  Pain could be from musculoskeletal etiology or GERD.  Started on Protonix.  No chest pain at present   Hyperlipidemia: Statin intolerance.  On Zetia.  Recent LDL of 118   Hyperglycemia: Last A1c 5.9.     Tobacco use: Counseled cessation   Anxiety: On Xanax  Discharge Diagnoses:  Principal Problem:   Chest pain    Discharge Instructions  Discharge Instructions     Diet - low sodium heart healthy   Complete by: As directed    Discharge instructions   Complete by: As directed    1)Please follow up with your PCP in a week   Increase activity slowly   Complete by: As directed       Allergies as of 03/17/2023       Reactions   Lisinopril Cough   Olmesartan Other (See  Comments)   Weakness, joint pain   Vicodin [hydrocodone-acetaminophen] Other (See Comments)   "makes my skin crawl"        Medication List     TAKE these medications    ALPRAZolam 1 MG tablet Commonly known as: XANAX TAKE 1/2 TO 1 TABLET 2-3 times a DAY ONLY AS NEEDED FOR ANXIETY attack. limit TO FIVE days/week TO AVOID addiction AND dementia   B-12 PO Take 1 tablet by mouth daily.   CINNAMON PO Take 1 capsule by mouth daily.   diclofenac sodium 1 % Gel Commonly known as: Voltaren Apply 4 g topically 4 (four) times daily. Bilateral shoulders What changed:  when to take this reasons to take this additional instructions   ELDERBERRY PO Take by mouth daily.   ezetimibe 10 MG tablet Commonly known as: ZETIA TAKE ONE TABLET DAILY FOR CHOLESTEROL   GOODY HEADACHE PO Take 1 Package by mouth 2 (two) times daily as needed (for pain).   losartan 100 MG tablet Commonly known as: COZAAR TAKE ONE TABLET BY MOUTH DAILY FOR blood pressure   MAGNESIUM PO Take 1 capsule by mouth daily.   naproxen sodium 220 MG tablet Commonly known as: ALEVE Take 440 mg by mouth daily as needed (pain relief).   pantoprazole 40 MG tablet Commonly known as: Protonix Take 1 tablet (40 mg total) by mouth daily.   sildenafil 100 MG tablet Commonly known as: VIAGRA TAKE 1/2 TO 1 TABLET AS NEEDED. What changed: See the new instructions.   traZODone 150 MG tablet Commonly known as:  DESYREL Take 1/2 to 1 tablet 1 to 2 hours before Bedtime for Sleep   VITAMIN D PO Take 1 capsule by mouth. Does not take every day   Zinc 50 MG Tabs Take by mouth daily.        Follow-up Information     [name], MD. Schedule an appointment as soon as possible for a visit in 1 week(s).   Specialty: Internal Medicine Contact information: [address] [place] [address] [phone]                Allergies  Allergen Reactions   Lisinopril Cough   Olmesartan  Other (See Comments)    Weakness, joint pain   Vicodin [Hydrocodone-Acetaminophen] Other (See Comments)    "makes my skin crawl"    Consultations: Cardiology   Procedures/Studies: CARDIAC CATHETERIZATION  Result Date: 03/17/2023 Normal coronary anatomy Normal LV function Normal LVEDP Plan: consider noncardiac causes of chest  pain   ECHOCARDIOGRAM COMPLETE  Result Date: 03/17/2023    ECHOCARDIOGRAM REPORT   Patient Name:   Grant Campbell Date of Exam: 03/17/2023 Medical Rec #:  [id number]     Height:       68.0 in Accession #:    [id number]    Weight:       157.3 lb Date of Birth:  1966-05-17     BSA:          1.845 m Patient Age:    57 years      BP:           109/83 mmHg Patient Gender: M             HR:           54 bpm. Exam Location:  Inpatient Procedure: 2D Echo, 3D Echo, Cardiac Doppler and Color Doppler Indications:    R07.9* Chest pain, unspecified  History:        Patient has no prior history of Echocardiogram examinations.                 Signs/Symptoms:Chest Pain; Risk Factors:Dyslipidemia,                 Hypertension and Current Smoker.  Sonographer:    Sheralyn Boatman RDCS Referring Phys: [id number] CAROLE N HALL IMPRESSIONS  1. Left ventricular ejection fraction, by estimation, is 55 to 60%. Left ventricular ejection fraction by 3D volume is 57 %. The left ventricle has low normal function. The left ventricle has no regional wall motion abnormalities. The left ventricular internal cavity size was mildly dilated. Left ventricular diastolic parameters were normal.  2. Right ventricular systolic function is low normal. The right ventricular size is mildly enlarged.  3. The mitral valve is degenerative. Mild mitral valve regurgitation. No evidence of mitral stenosis.  4. The aortic valve is tricuspid. Aortic valve regurgitation is not visualized. No aortic stenosis is present.  5. There is mild dilatation of the ascending aorta, measuring 38 mm.  6. The inferior vena cava is normal in size with  greater than 50% respiratory variability, suggesting right atrial pressure of 3 mmHg. Comparison(s): No prior Echocardiogram. FINDINGS  Left Ventricle: Left ventricular ejection fraction, by estimation, is 55 to 60%. Left ventricular ejection fraction by 3D volume is 57 %. The left ventricle has low normal function. The left ventricle has no regional wall motion abnormalities. The left ventricular internal cavity size was mildly dilated. There is no left ventricular hypertrophy. Left ventricular diastolic parameters were normal. Right  Ventricle: The right ventricular size is mildly enlarged. No increase in right ventricular wall thickness. Right ventricular systolic function is low normal. Left Atrium: Left atrial size was normal in size. Right Atrium: Right atrial size was normal in size. Pericardium: There is no evidence of pericardial effusion. Mitral Valve: The mitral valve is degenerative in appearance. Mild mitral valve regurgitation. No evidence of mitral valve stenosis. Tricuspid Valve: The tricuspid valve is normal in structure. Tricuspid valve regurgitation is trivial. No evidence of tricuspid stenosis. Aortic Valve: The aortic valve is tricuspid. Aortic valve regurgitation is not visualized. No aortic stenosis is present. Pulmonic Valve: The pulmonic valve was grossly normal. Pulmonic valve regurgitation is trivial. No evidence of pulmonic stenosis. Aorta: The aortic root is normal in size and structure. There is mild dilatation of the ascending aorta, measuring 38 mm. Venous: The inferior vena cava is normal in size with greater than 50% respiratory variability, suggesting right atrial pressure of 3 mmHg. IAS/Shunts: No atrial level shunt detected by color flow Doppler.  LEFT VENTRICLE PLAX 2D LVIDd:         4.75 cm         Diastology LVIDs:         3.20 cm         LV e' medial:    9.36 cm/s LV PW:         0.95 cm         LV E/e' medial:  8.5 LV IVS:        0.90 cm         LV e' lateral:   14.90 cm/s  LVOT diam:     2.30 cm         LV E/e' lateral: 5.3 LV SV:         107 LV SV Index:   58 LVOT Area:     4.15 cm        3D Volume EF                                LV 3D EF:    Left                                             ventricul LV Volumes (MOD)                            ar LV vol d, MOD    138.0 ml                   ejection A2C:                                        fraction LV vol d, MOD    102.2 ml                   by 3D A4C:                                        volume is LV vol s, MOD    56.6 ml  57 %. A2C: LV vol s, MOD    45.8 ml A4C:                           3D Volume EF: LV SV MOD A2C:   81.4 ml       3D EF:        57 % LV SV MOD A4C:   102.2 ml LV SV MOD BP:    65.6 ml RIGHT VENTRICLE             IVC RV S prime:     11.40 cm/s  IVC diam: 1.50 cm TAPSE (M-mode): 1.9 cm LEFT ATRIUM             Index        RIGHT ATRIUM           Index LA diam:        3.00 cm 1.63 cm/m   RA Area:     18.00 cm LA Vol (A2C):   24.6 ml 13.33 ml/m  RA Volume:   50.50 ml  27.36 ml/m LA Vol (A4C):   24.4 ml 13.22 ml/m LA Biplane Vol: 25.0 ml 13.55 ml/m  AORTIC VALVE             PULMONIC VALVE LVOT Vmax:   113.00 cm/s PR End Diast Vel: 1.23 msec LVOT Vmean:  72.600 cm/s LVOT VTI:    0.257 m  AORTA Ao Root diam: 3.30 cm Ao Asc diam:  3.60 cm MITRAL VALVE MV Area (PHT): 2.87 cm    SHUNTS MV Decel Time: 264 msec    Systemic VTI:  0.26 m MV E velocity: 79.70 cm/s  Systemic Diam: 2.30 cm MV A velocity: 58.20 cm/s MV E/A ratio:  1.37 Aditya Sabharwal Electronically signed by Dorthula Nettles Signature Date/Time: 03/17/2023/10:13:14 AM    Final    CT Angio Chest PE W/Cm &/Or Wo Cm  Result Date: 03/16/2023 CLINICAL DATA:  Pulmonary embolus suspected EXAM: CT ANGIOGRAPHY CHEST WITH CONTRAST TECHNIQUE: Multidetector CT imaging of the chest was performed using the standard protocol during bolus administration of intravenous contrast. Multiplanar CT image reconstructions and MIPs were obtained to  evaluate the vascular anatomy. RADIATION DOSE REDUCTION: This exam was performed according to the departmental dose-optimization program which includes automated exposure control, adjustment of the mA and/or kV according to patient size and/or use of iterative reconstruction technique. CONTRAST:  75mL OMNIPAQUE IOHEXOL 350 MG/ML SOLN COMPARISON:  None Available. FINDINGS: Cardiovascular: No evidence of pulmonary embolus. Normal heart size. No pericardial effusion. Normal caliber thoracic aorta with mild atherosclerotic disease. No coronary artery calcifications. Mediastinum/Nodes: Esophagus and thyroid are unremarkable. No enlarged lymph nodes seen in the chest. Lungs/Pleura: Central airways are patent. No consolidation, pleural effusion or pneumothorax. Upper Abdomen: No acute abnormality. Musculoskeletal: No chest wall abnormality. No acute or significant osseous findings. Review of the MIP images confirms the above findings. IMPRESSION: 1. No evidence of pulmonary embolus or acute airspace opacity. 2. Mild aortic Atherosclerosis (ICD10-I70.0). Electronically Signed   By: Allegra Lai M.D.   On: 03/16/2023 19:59   DG Chest 2 View  Result Date: 03/16/2023 CLINICAL DATA:  Chest pain. EXAM: CHEST - 2 VIEW COMPARISON:  None Available. FINDINGS: Tortuosity of the aorta. Cardiomediastinal silhouette is normal. Mediastinal contours appear intact. There is no evidence of focal airspace consolidation, pleural effusion or pneumothorax. Osseous structures are without acute abnormality. Soft tissues are grossly normal. IMPRESSION: No active cardiopulmonary disease. Electronically  Signed   By: Ted Mcalpine M.D.   On: 03/16/2023 15:15      Subjective: Patient was seen and examined at the bedside this morning.  He was comfortable, denied any chest pain.  Underwent cardiac catheter which was normal as per cardiology.  Cardiology recommended for discharge home today.  Discharge Exam: Vitals:   03/17/23 1442  03/17/23 1447  BP: 107/68 123/79  Pulse: 61 61  Resp: 16 15  Temp:    SpO2: 98% 97%   Vitals:   03/17/23 1432 03/17/23 1437 03/17/23 1442 03/17/23 1447  BP: 112/75 123/79 107/68 123/79  Pulse: (!) 55 (!) 55 61 61  Resp: 11 18 16 15   Temp:      TempSrc:      SpO2: 97% 97% 98% 97%  Weight:      Height:        General: Pt is alert, awake, not in acute distress Cardiovascular: RRR, S1/S2 +, no rubs, no gallops Respiratory: CTA bilaterally, no wheezing, no rhonchi Abdominal: Soft, NT, ND, bowel sounds + Extremities: no edema, no cyanosis    The results of significant diagnostics from this hospitalization (including imaging, microbiology, ancillary and laboratory) are listed below for reference.     Microbiology: No results found for this or any previous visit (from the past 240 hour(s)).   Labs: BNP (last 3 results) No results for input(s): "BNP" in the last 8760 hours. Basic Metabolic Panel: Recent Labs  Lab 03/16/23 1411 03/17/23 0531  NA 138 139  K 3.7 3.9  CL 102 106  CO2 27 26  GLUCOSE 147* 99  BUN 16 14  CREATININE 1.32* 1.16  CALCIUM 9.5 8.7*  MG  --  2.2  PHOS  --  3.7   Liver Function Tests: No results for input(s): "AST", "ALT", "ALKPHOS", "BILITOT", "PROT", "ALBUMIN" in the last 168 hours. No results for input(s): "LIPASE", "AMYLASE" in the last 168 hours. No results for input(s): "AMMONIA" in the last 168 hours. CBC: Recent Labs  Lab 03/16/23 1411 03/17/23 0531  WBC 8.7 7.2  HGB 14.4 13.7  HCT 40.9 39.3  MCV 82.5 84.2  PLT 206 195   Cardiac Enzymes: No results for input(s): "CKTOTAL", "CKMB", "CKMBINDEX", "TROPONINI" in the last 168 hours. BNP: Invalid input(s): "POCBNP" CBG: No results for input(s): "GLUCAP" in the last 168 hours. D-Dimer Recent Labs    03/16/23 1413  DDIMER 0.59*   Hgb A1c Recent Labs    03/17/23 0531  HGBA1C 5.9*   Lipid Profile Recent Labs    03/17/23 0531  CHOL 168  HDL 30*  LDLCALC 118*  TRIG 100   CHOLHDL 5.6   Thyroid function studies No results for input(s): "TSH", "T4TOTAL", "T3FREE", "THYROIDAB" in the last 72 hours.  Invalid input(s): "FREET3" Anemia work up No results for input(s): "VITAMINB12", "FOLATE", "FERRITIN", "TIBC", "IRON", "RETICCTPCT" in the last 72 hours. Urinalysis    Component Value Date/Time   COLORURINE YELLOW 10/30/2022 1017   APPEARANCEUR CLEAR 10/30/2022 1017   LABSPEC 1.015 10/30/2022 1017   PHURINE < OR = 5.0 10/30/2022 1017   GLUCOSEU NEGATIVE 10/30/2022 1017   HGBUR NEGATIVE 10/30/2022 1017   BILIRUBINUR NEGATIVE 07/31/2016 1549   KETONESUR NEGATIVE 10/30/2022 1017   PROTEINUR NEGATIVE 10/30/2022 1017   NITRITE NEGATIVE 10/30/2022 1017   LEUKOCYTESUR NEGATIVE 10/30/2022 1017   Sepsis Labs Recent Labs  Lab 03/16/23 1411 03/17/23 0531  WBC 8.7 7.2   Microbiology No results found for this or any previous visit (from the  past 240 hour(s)).  Please note: You were cared for by a hospitalist during your hospital stay. Once you are discharged, your primary care physician will handle any further medical issues. Please note that NO REFILLS for any discharge medications will be authorized once you are discharged, as it is imperative that you return to your primary care physician (or establish a relationship with a primary care physician if you do not have one) for your post hospital discharge needs so that they can reassess your need for medications and monitor your lab values.    Time coordinating discharge: 40 minutes  SIGNED:   Burnadette Pop, MD  [facility] 03/17/2023, 2:59 PM Pager [phone]  If 7PM-7AM, please contact night-coverage [email or web address] Password [email or web address]

## 2023-03-17 NOTE — H&P (Addendum)
History and Physical  Grant Campbell UXL:[id number] DOB: 1965-12-27 DOA: 03/16/2023  Referring physician: Accepted by Dr. Arlean Hopping, [facility], Hospitalist service  PCP: [name], MD  Outpatient Specialists: None Patient coming from: Home  Chief Complaint: Chest pain.   HPI: Grant Campbell is a 57 y.o. male with medical history significant for hyperlipidemia on Zetia due to intolerance to statin, current smoker, chronic anxiety on Xanax as needed, family history of coronary artery disease in his mother who deceased at the age of 53 of MI, his father deceased at the age of 87 of complication from coronary artery disease (also had kidney transplant), who initially presented to [facility] ED with complaints of nonexertional chest pain.  Described as centrally located pressure-like radiated to his left shoulder and left jaw.  Moderate pain.  Associated with shortness of breath.  Onset yesterday morning.  No improving or worsening factors.  Uses Goody powder for minor aches and pain.  Had not seen a cardiologist in the past.  His wife brought him into the ED for further evaluation.  In the ED, he received sublingual nitroglycerin with improvement of his chest pain from 5 out of 10 to 1-2 out of 10.  High-sensitivity troponin in the ED was negative.  There were no evidence of acute ischemia on twelve-lead EKG.  CT angio chest was negative for pulmonary embolism however showed mild aortic atherosclerosis.  TRH was asked to admit for chest pain rule out ACS.  The patient was accepted by Dr. Arlean Hopping, [facility], and transferred to [facility] telemetry cardiac unit as observation status.  At the time of this visit, the patient's chest pain is improved after the sublingual nitroglycerin.  His wife at bedside is concerned about his symptoms in light of his parents being deceased after complications from their heart disease in their 81 years of age.  Per his wife at bedside, the patient no longer eats ice cream  nightly.  His last LDL was 219 on 02/16/2023.  Cardiology, Dr. Mackie Pai, was consulted to assist with the management.  The patient was made n.p.o. and gentle IV fluid hydration was initiated in case he would require IV contrast for further cardiac workup.  ED Course: Temperature 97.7.  BP 109/83, pulse 61, respiratory 16, O2 saturation 94% on room air.  CBC unremarkable.  Serum glucose 147, creatinine 1.32.  GFR greater than 60.  Review of Systems: Review of systems as noted in the HPI. All other systems reviewed and are negative.   Past Medical History:  Diagnosis Date   Hyperlipidemia    Hypertension    Hypogonadism male    Vitamin D deficiency    Past Surgical History:  Procedure Laterality Date   FINGER SURGERY     LAPAROSCOPIC APPENDECTOMY N/A 07/12/2022   Procedure: APPENDECTOMY LAPAROSCOPIC;  Surgeon: Berna Bue, MD;  Location: [facility] OR;  Service: General;  Laterality: N/A;   ROTATOR CUFF REPAIR      Social History:  reports that he has been smoking cigarettes. He has never used smokeless tobacco. He reports that he does not currently use alcohol after a past usage of about 7.0 standard drinks of alcohol per week. He reports that he does not use drugs.   Allergies  Allergen Reactions   Lisinopril Cough   Olmesartan Other (See Comments)    Weakness, joint pain   Vicodin [Hydrocodone-Acetaminophen] Other (See Comments)    "makes my skin crawl"    Family History  Problem Relation Age of Onset  Heart attack Mother    Kidney failure Father    Hypertension Brother    Hypothyroidism Brother    Hyperlipidemia Brother       Prior to Admission medications   Medication Sig Start Date End Date Taking? Authorizing Provider  ALPRAZolam (XANAX) 1 MG tablet TAKE 1/2 TO 1 TABLET 2-3 times a DAY ONLY AS NEEDED FOR ANXIETY attack. limit TO FIVE days/week TO AVOID addiction AND dementia 02/10/23   Raynelle Dick, NP  Aspirin-Acetaminophen-Caffeine (GOODY HEADACHE PO) Take 1  Package by mouth 2 (two) times daily as needed (for pain).    [provider]  CINNAMON PO Take 1 capsule by mouth daily.    [provider]  Cyanocobalamin (B-12 PO) Take 1 tablet by mouth daily.    [provider]  diclofenac sodium (VOLTAREN) 1 % GEL Apply 4 g topically 4 (four) times daily. Bilateral shoulders Patient taking differently: Apply 4 g topically 4 (four) times daily as needed (shoulder pain). 02/17/18   Kirtland Bouchard, PA-C  ELDERBERRY PO Take by mouth daily.    [provider]  ezetimibe (ZETIA) 10 MG tablet TAKE ONE TABLET DAILY FOR CHOLESTEROL 11/13/22   Raynelle Dick, NP  losartan (COZAAR) 100 MG tablet TAKE ONE TABLET BY MOUTH DAILY FOR blood pressure 01/20/23   Raynelle Dick, NP  MAGNESIUM PO Take 1 capsule by mouth daily.    [provider]  naproxen sodium (ALEVE) 220 MG tablet Take 440 mg by mouth daily as needed (pain relief).    [provider]  sildenafil (VIAGRA) 100 MG tablet TAKE 1/2 TO 1 TABLET AS NEEDED. Patient taking differently: Take 50-100 mg by mouth as needed for erectile dysfunction. 03/17/22   Raynelle Dick, NP  traZODone (DESYREL) 150 MG tablet Take 1/2 to 1 tablet 1 to 2 hours before Bedtime for Sleep 02/16/23   [name], MD  VITAMIN D PO Take 1 capsule by mouth. Does not take every day    [provider]  Zinc 50 MG TABS Take by mouth daily.    [provider]    Physical Exam: BP 109/83 (BP Location: Left Arm)   Pulse 61   Temp 97.7 F (36.5 C) (Oral)   Resp 16   Ht 5\' 8"  (1.727 m)   Wt 71.4 kg   SpO2 94%   BMI 23.92 kg/m   General: 57 y.o. year-old male well developed well nourished in no acute distress.  Alert and oriented x3. Cardiovascular: Regular rate and rhythm with no rubs or gallops.  No thyromegaly or JVD noted.  No lower extremity edema. 2/4 pulses in all 4 extremities. Respiratory: Clear to auscultation with no wheezes or rales. Good  inspiratory effort. Abdomen: Soft nontender nondistended with normal bowel sounds x4 quadrants. Muskuloskeletal: No cyanosis, clubbing or edema noted bilaterally Neuro: CN II-XII intact, strength, sensation, reflexes Skin: No ulcerative lesions noted or rashes Psychiatry: Judgement and insight appear normal. Mood is appropriate for condition and setting          Labs on Admission:  Basic Metabolic Panel: Recent Labs  Lab 03/16/23 1411  NA 138  K 3.7  CL 102  CO2 27  GLUCOSE 147*  BUN 16  CREATININE 1.32*  CALCIUM 9.5   Liver Function Tests: No results for input(s): "AST", "ALT", "ALKPHOS", "BILITOT", "PROT", "ALBUMIN" in the last 168 hours. No results for input(s): "LIPASE", "AMYLASE" in the last 168 hours. No results for input(s): "AMMONIA" in the last  168 hours. CBC: Recent Labs  Lab 03/16/23 1411  WBC 8.7  HGB 14.4  HCT 40.9  MCV 82.5  PLT 206   Cardiac Enzymes: No results for input(s): "CKTOTAL", "CKMB", "CKMBINDEX", "TROPONINI" in the last 168 hours.  BNP (last 3 results) No results for input(s): "BNP" in the last 8760 hours.  ProBNP (last 3 results) No results for input(s): "PROBNP" in the last 8760 hours.  CBG: No results for input(s): "GLUCAP" in the last 168 hours.  Radiological Exams on Admission: CT Angio Chest PE W/Cm &/Or Wo Cm  Result Date: 03/16/2023 CLINICAL DATA:  Pulmonary embolus suspected EXAM: CT ANGIOGRAPHY CHEST WITH CONTRAST TECHNIQUE: Multidetector CT imaging of the chest was performed using the standard protocol during bolus administration of intravenous contrast. Multiplanar CT image reconstructions and MIPs were obtained to evaluate the vascular anatomy. RADIATION DOSE REDUCTION: This exam was performed according to the departmental dose-optimization program which includes automated exposure control, adjustment of the mA and/or kV according to patient size and/or use of iterative reconstruction technique. CONTRAST:  75mL OMNIPAQUE  IOHEXOL 350 MG/ML SOLN COMPARISON:  None Available. FINDINGS: Cardiovascular: No evidence of pulmonary embolus. Normal heart size. No pericardial effusion. Normal caliber thoracic aorta with mild atherosclerotic disease. No coronary artery calcifications. Mediastinum/Nodes: Esophagus and thyroid are unremarkable. No enlarged lymph nodes seen in the chest. Lungs/Pleura: Central airways are patent. No consolidation, pleural effusion or pneumothorax. Upper Abdomen: No acute abnormality. Musculoskeletal: No chest wall abnormality. No acute or significant osseous findings. Review of the MIP images confirms the above findings. IMPRESSION: 1. No evidence of pulmonary embolus or acute airspace opacity. 2. Mild aortic Atherosclerosis (ICD10-I70.0). Electronically Signed   By: Allegra Lai M.D.   On: 03/16/2023 19:59   DG Chest 2 View  Result Date: 03/16/2023 CLINICAL DATA:  Chest pain. EXAM: CHEST - 2 VIEW COMPARISON:  None Available. FINDINGS: Tortuosity of the aorta. Cardiomediastinal silhouette is normal. Mediastinal contours appear intact. There is no evidence of focal airspace consolidation, pleural effusion or pneumothorax. Osseous structures are without acute abnormality. Soft tissues are grossly normal. IMPRESSION: No active cardiopulmonary disease. Electronically Signed   By: Ted Mcalpine M.D.   On: 03/16/2023 15:15    EKG: I independently viewed the EKG done and my findings are as followed: Normal sinus rhythm, nonspecific ST-T changes.  QTc is 395.  Assessment/Plan Present on Admission:  Chest pain  Principal Problem:   Chest pain  Chest pain rule out ACS Heart score 5, moderate score. Hypercholesterolemia Current smoking Family history of CVD before age 20 Age of 63 Last LDL 219 on 02/16/2023 Resume home Zetia Has not tolerated statin in the past Received a full dose of aspirin 325 mg x 1 Aspirin 81 mg daily, added. Chest pain improved with sublingual nitroglycerin Follow 2D  echo Normal saline at 75 cc/h x 2 days Defer rest of management to cardiology  Hyperlipidemia Resume home Zetia Intolerance to statin Rest of management per cardiology  Hyperglycemia Presented with serum sodium of 147 Obtain hemoglobin A1c Last hemoglobin A1c 5.4 on 02/16/2023  Tobacco use disorder Recommend complete tobacco cessation May consider Chantix if okay with cardiology  Chronic anxiety Resume home Xanax as needed 3 times daily   Time: 75 minutes.   DVT prophylaxis: Subcu Lovenox daily  Code Status: Full code  Family Communication: Updated patient's wife at bedside  Disposition Plan: Admitted to telemetry cardiac unit  Consults called: Cardiology consulted  Admission status: Observation status.   Status is: Observation  Darlin Drop MD [facility] Pager [phone]  If 7PM-7AM, please contact night-coverage [email or web address] Password [email or web address]  03/17/2023, 4:38 AM

## 2023-03-17 NOTE — Progress Notes (Signed)
Brief same day note:  Patient is a 57 year old male with history of hyperlipidemia on Zetia, smoker, anxiety who presented here with complaint of nonexertional chest pain.  Chest discomfort is described as pressure-like, radiating to left shoulder and nausea.  Ongoing for last 2 weeks.  On presentation, negative troponin, no evidence of ischemia as well on  ekg.  CT chest was negative for pe.  patient was admitted for suspicious of unstable angina.  Cardiology consulted.  Plan for cardiac cath.  Patient seen and examined at bedside this morning.  Hemodynamically stable.  Denies any chest pain during my evaluation.  Assessment and plan:  Chest pain : Presented with nonexertional chest pain.  Risk factors include smoking, hyperlipidemia.  Given aspirin.  Currently on baby aspirin.  Echo showed EF of 60%, no wall motion abnormality.  Troponins have been negative.  Cardiology consulted and following.  Plan for heart cath  Hyperlipidemia: Statin intolerance.  On Zetia.  Recent LDL of 118  Hyperglycemia: Last A1c 5.9.    Tobacco use: Counseled cessation  Anxiety: On Xanax

## 2023-03-17 NOTE — H&P (View-Only) (Signed)
Cardiology Consultation   Patient ID: Grant Campbell MRN: [id number]; DOB: May 27, 1966  Admit date: 03/16/2023 Date of Consult: 03/17/2023  PCP:  [name], MD   [facility] Providers Cardiologist:  None   {  Patient Profile:   Grant Campbell is a 57 y.o. male with a hypertension, hx of hyperlipidemia on Zetia, intolerant to statins, current smoker,  who is being seen 03/17/2023 for the evaluation of chest pain at the request of Dr. Willia Craze.  History of Present Illness:   Mr. Grant Campbell has no prior cardiac history.  Family history significant for father who died at the age of 32 due to MI, father who also died at 31 due to MI. Prediabetic, A1c 5.9%.  Currently smokes 1 pack/day for last 40+ years.  Patient is presenting now with worsening complaints of heavy exertional chest pain with radiation to his left shoulder and jaw.  Describes it as 5 out of 10. He states that he has had chronic chest pain for the last 2 years and felt that it was more age-related.  However now, states that the symptoms have worsened for the past 2 days and accompanied with shortness of breath, dizziness, nausea.  He was seen in the emergency room and given nitroglycerin with relief.  Has some slight discomfort now.  His wife is accompanied with him who is very hands-on with patient's care.  Overall patient lives a very healthy lifestyle and works in [employer].  Wife cooks mainly at home.  He admits to eating some ice cream however is going to stop after todays event.  ED workup: No evidence of ischemia on EKG, negative troponins.  CTA negative for PE, mild aortic atherosclerosis.  No significant findings on lab work.   Past Medical History:  Diagnosis Date   Hyperlipidemia    Hypertension    Hypogonadism male    Vitamin D deficiency     Past Surgical History:  Procedure Laterality Date   FINGER SURGERY     LAPAROSCOPIC APPENDECTOMY N/A 07/12/2022   Procedure: APPENDECTOMY LAPAROSCOPIC;   Surgeon: Berna Bue, MD;  Location: [facility] OR;  Service: General;  Laterality: N/A;   ROTATOR CUFF REPAIR       Inpatient Medications: Scheduled Meds:  [START ON 03/18/2023] aspirin EC  81 mg Oral Daily   enoxaparin (LOVENOX) injection  40 mg Subcutaneous Daily   ezetimibe  10 mg Oral Daily   Continuous Infusions:  sodium chloride 75 mL/hr at 03/17/23 0600   PRN Meds: acetaminophen, ALPRAZolam, melatonin, nitroGLYCERIN, polyethylene glycol, prochlorperazine  Allergies:    Allergies  Allergen Reactions   Lisinopril Cough   Olmesartan Other (See Comments)    Weakness, joint pain   Vicodin [Hydrocodone-Acetaminophen] Other (See Comments)    "makes my skin crawl"    Social History:   Social History   Socioeconomic History   Marital status: Married    Spouse name: Not on file   Number of children: Not on file   Years of education: Not on file   Highest education level: Not on file  Occupational History   Not on file  Tobacco Use   Smoking status: Every Day    Current packs/day: 1.00    Types: Cigarettes   Smokeless tobacco: Never  Substance and Sexual Activity   Alcohol use: Not Currently    Alcohol/week: 7.0 standard drinks of alcohol    Types: 7 Standard drinks or equivalent per week    Comment: occasional   Drug use:  No   Sexual activity: Not on file  Other Topics Concern   Not on file  Social History Narrative   Not on file   Social Determinants of Health   Financial Resource Strain: Not on file  Food Insecurity: No Food Insecurity (03/16/2023)   Hunger Vital Sign    Worried About Running Out of Food in the Last Year: Never true    Ran Out of Food in the Last Year: Never true  Transportation Needs: No Transportation Needs (03/16/2023)   PRAPARE - Administrator, Civil Service (Medical): No    Lack of Transportation (Non-Medical): No  Physical Activity: Not on file  Stress: Not on file  Social Connections: Not on file  Intimate Partner  Violence: Not At Risk (03/16/2023)   Humiliation, Afraid, Rape, and Kick questionnaire    Fear of Current or Ex-Partner: No    Emotionally Abused: No    Physically Abused: No    Sexually Abused: No    Family History:   Family History  Problem Relation Age of Onset   Heart attack Mother    Kidney failure Father    Hypertension Brother    Hypothyroidism Brother    Hyperlipidemia Brother      ROS:  Please see the history of present illness.  All other ROS reviewed and negative.     Physical Exam/Data:   Vitals:   03/17/23 0200 03/17/23 0344 03/17/23 0400 03/17/23 0600  BP:  109/83    Pulse:  61    Resp: 15 16 15 16   Temp:  97.7 F (36.5 C)    TempSrc:  Oral    SpO2: 100% 94% 98% 100%  Weight:      Height:        Intake/Output Summary (Last 24 hours) at 03/17/2023 0953 Last data filed at 03/17/2023 0600 Gross per 24 hour  Intake 780 ml  Output --  Net 780 ml      03/16/2023   10:46 PM 03/16/2023    1:58 PM 02/16/2023    9:42 AM  Last 3 Weights  Weight (lbs) 157 lb 4.8 oz 160 lb 170 lb 3.2 oz  Weight (kg) 71.351 kg 72.576 kg 77.202 kg     Body mass index is 23.92 kg/m.  General:  Well nourished, well developed, in no acute distress HEENT: normal Neck: no JVD Vascular: No carotid bruits; Distal pulses 2+ bilaterally Cardiac:  normal S1, S2; RRR; no murmur  Lungs:  clear to auscultation bilaterally, no wheezing, rhonchi or rales  Abd: soft, nontender, no hepatomegaly  Ext: no edema Musculoskeletal:  No deformities, BUE and BLE strength normal and equal Skin: warm and dry  Neuro:  CNs 2-12 intact, no focal abnormalities noted Psych:  Normal affect   EKG:  The EKG was personally reviewed and demonstrates:  EKG shows normal sinus rhythm, heart rate 67.  No acute ST-T wave changes. Telemetry:  Telemetry was personally reviewed and demonstrates: Sinus bradycardia heart 50-60  Relevant CV Studies: Echo pending  Laboratory Data:  High Sensitivity Troponin:    Recent Labs  Lab 03/16/23 1411 03/16/23 1726  TROPONINIHS 3 3     Chemistry Recent Labs  Lab 03/16/23 1411 03/17/23 0531  NA 138 139  K 3.7 3.9  CL 102 106  CO2 27 26  GLUCOSE 147* 99  BUN 16 14  CREATININE 1.32* 1.16  CALCIUM 9.5 8.7*  MG  --  2.2  GFRNONAA >60 >60  ANIONGAP 9 7  No results for input(s): "PROT", "ALBUMIN", "AST", "ALT", "ALKPHOS", "BILITOT" in the last 168 hours. Lipids  Recent Labs  Lab 03/17/23 0531  CHOL 168  TRIG 100  HDL 30*  LDLCALC 118*  CHOLHDL 5.6    Hematology Recent Labs  Lab 03/16/23 1411 03/17/23 0531  WBC 8.7 7.2  RBC 4.96 4.67  HGB 14.4 13.7  HCT 40.9 39.3  MCV 82.5 84.2  MCH 29.0 29.3  MCHC 35.2 34.9  RDW 12.5 12.9  PLT 206 195   Thyroid No results for input(s): "TSH", "FREET4" in the last 168 hours.  BNPNo results for input(s): "BNP", "PROBNP" in the last 168 hours.  DDimer  Recent Labs  Lab 03/16/23 1413  DDIMER 0.59*     Radiology/Studies:  CT Angio Chest PE W/Cm &/Or Wo Cm  Result Date: 03/16/2023 CLINICAL DATA:  Pulmonary embolus suspected EXAM: CT ANGIOGRAPHY CHEST WITH CONTRAST TECHNIQUE: Multidetector CT imaging of the chest was performed using the standard protocol during bolus administration of intravenous contrast. Multiplanar CT image reconstructions and MIPs were obtained to evaluate the vascular anatomy. RADIATION DOSE REDUCTION: This exam was performed according to the departmental dose-optimization program which includes automated exposure control, adjustment of the mA and/or kV according to patient size and/or use of iterative reconstruction technique. CONTRAST:  75mL OMNIPAQUE IOHEXOL 350 MG/ML SOLN COMPARISON:  None Available. FINDINGS: Cardiovascular: No evidence of pulmonary embolus. Normal heart size. No pericardial effusion. Normal caliber thoracic aorta with mild atherosclerotic disease. No coronary artery calcifications. Mediastinum/Nodes: Esophagus and thyroid are unremarkable. No enlarged  lymph nodes seen in the chest. Lungs/Pleura: Central airways are patent. No consolidation, pleural effusion or pneumothorax. Upper Abdomen: No acute abnormality. Musculoskeletal: No chest wall abnormality. No acute or significant osseous findings. Review of the MIP images confirms the above findings. IMPRESSION: 1. No evidence of pulmonary embolus or acute airspace opacity. 2. Mild aortic Atherosclerosis (ICD10-I70.0). Electronically Signed   By: Allegra Lai M.D.   On: 03/16/2023 19:59   DG Chest 2 View  Result Date: 03/16/2023 CLINICAL DATA:  Chest pain. EXAM: CHEST - 2 VIEW COMPARISON:  None Available. FINDINGS: Tortuosity of the aorta. Cardiomediastinal silhouette is normal. Mediastinal contours appear intact. There is no evidence of focal airspace consolidation, pleural effusion or pneumothorax. Osseous structures are without acute abnormality. Soft tissues are grossly normal. IMPRESSION: No active cardiopulmonary disease. Electronically Signed   By: Ted Mcalpine M.D.   On: 03/16/2023 15:15     Assessment and Plan:   Chest pain Patient with very strong family history of early death related to MI in both parents and with multiple high risk factors CAD presenting with worsening complaints of exertional chest pain that has been going on for the last 2 years, worsened in the last 2 days with radiation to left shoulder and neck, with accompanying shortness of breath, nausea, dizziness.  EKG fortunately does not show any ischemic changes.  Troponins negative. Will plan for cardiac catheterization. Was loaded on aspirin Sinus bradycardia prohibits beta-blocker Will order LP(a) Echocardiogram pending  Informed Consent    The risks [stroke (1 in 1000), death (1 in 1000), kidney failure [usually temporary] (1 in 500), bleeding (1 in 200), allergic reaction [possibly serious] (1 in 200)], benefits (diagnostic support and management of coronary artery disease) and alternatives of a cardiac  catheterization were discussed in detail with Mr. Szczesniak and he is willing to proceed.  Mild aortic atherosclerosis Noted on CTA.  No coronary calcifications.  Hyperlipidemia Intolerant to statins due to myalgias (  has tried rosuvastatin/rosuvastatin) Currently on Zetia 10 mg. LDL, 118.  Would be a good candidate for Repatha.  Hypertension Well-controlled here.  PTA losartan 100 mg  Nicotine dependence Currently smokes 1 pack/day.  Has tried multiple other therapies and did not tolerate Chantix/bupropion.  Would be willing to try patches.  Patient was given [place] quit line information.      Risk Assessment/Risk Scores:   For questions or updates, please contact [facility] Please consult [email or web address] for contact info under    Signed, Abagail Kitchens, PA-C  03/17/2023 9:53 AM

## 2023-03-18 ENCOUNTER — Encounter (HOSPITAL_COMMUNITY): Payer: Self-pay | Admitting: Cardiology

## 2023-03-22 NOTE — Progress Notes (Unsigned)
Future Appointments  Date Time Provider Department  03/23/2023 11:30 AM [name], MD [facility]  05/19/2023  9:30 AM [name], MD [facility]  08/18/2023  9:30 AM [name], MD [facility]  11/16/2023 10:00 AM [name], MD [facility]    History of Present Illness:     This  57 y.o. MWM with HTN, HLD, Pre-Diabetes and Vitamin D Deficiency presents for follow up after hospitalization 03/16/23 & had Lt Heart Cath  by Dr [place]  which was normal & he was discharged after the Cath.  .  Patient returns today for  6 day f/u       Current Outpatient Medications on File Prior to Visit  Medication Sig   ALPRAZolam (XANAX) 1 MG tablet TAKE 1/2 TO 1 TABLET 2-3 times a DAY ONLY AS NEEDED FOR ANXIETY attack. limit TO FIVE days/week TO AVOID addiction AND dementia (Patient taking differently: Take 1 mg by mouth daily as needed for anxiety.)   Aspirin-Acetaminophen-Caffeine (GOODY HEADACHE PO) Take 1 Package by mouth 2 (two) times daily as needed (for pain).   CINNAMON PO Take 1 capsule by mouth daily.   Cyanocobalamin (B-12 PO) Take 1 tablet by mouth daily.   diclofenac sodium (VOLTAREN) 1 % GEL Apply 4 g topically 4 (four) times daily. Bilateral shoulders (Patient taking differently: Apply 4 g topically 4 (four) times daily as needed (shoulder pain).)   ELDERBERRY PO Take by mouth daily. (Patient not taking: Reported on 03/17/2023)   ezetimibe (ZETIA) 10 MG tablet TAKE ONE TABLET DAILY FOR CHOLESTEROL (Patient taking differently: Take 10 mg by mouth daily.)   losartan (COZAAR) 100 MG tablet TAKE ONE TABLET BY MOUTH DAILY FOR blood pressure (Patient taking differently: Take 100 mg by mouth daily.)   MAGNESIUM PO Take 1 capsule by mouth daily.   naproxen sodium (ALEVE) 220 MG tablet Take 440 mg by mouth daily as needed (pain relief).   pantoprazole (PROTONIX) 40 MG tablet Take 1 tablet (40 mg total) by mouth daily.   sildenafil (VIAGRA) 100 MG tablet TAKE 1/2 TO 1 TABLET  AS NEEDED. (Patient taking differently: Take 50-100 mg by mouth as needed for erectile dysfunction.)   traZODone (DESYREL) 150 MG tablet Take 1/2 to 1 tablet 1 to 2 hours before Bedtime for Sleep (Patient not taking: Reported on 03/17/2023)   VITAMIN D PO Take 1 capsule by mouth daily.   Zinc 50 MG TABS Take 1 tablet by mouth daily.   No current facility-administered medications on file prior to visit.    Allergies  Allergen Reactions   Lisinopril Cough   Olmesartan Other (See Comments)    Weakness, joint pain   Vicodin [Hydrocodone-Acetaminophen] Other (See Comments)    "makes my skin crawl"     Problem list He has Hyperlipidemia, mixed; Vitamin D deficiency; Essential hypertension; Anxiety state; Medication management; Poor compliance; Abnormal glucose; GERD ; Overweight (BMI 25.0-29.9); Acute appendicitis; and Chest pain on their problem list.   Observations/Objective:  There were no vitals taken for this visit.  HEENT - WNL. Neck - supple.  Chest - Clear equal BS. Cor - Nl HS. RRR w/o sig MGR. PP 1(+). No edema. MS- FROM w/o deformities.  Gait Nl. Neuro -  Nl w/o focal abnormalities.   Assessment and Plan:      Follow Up Instructions:        I discussed the assessment and treatment plan with the patient. The patient was provided an opportunity to ask questions and all were  answered. The patient agreed with the plan and demonstrated an understanding of the instructions.       The patient was advised to call back or seek an in-person evaluation if the symptoms worsen or if the condition fails to improve as anticipated.    Marinus Maw, MD

## 2023-03-22 NOTE — Patient Instructions (Signed)
High Cholesterol   ================================== Cholesterol               = 291       ( Goal is less than 200 )   Good HDL Chol     =  39       ( Goal is over 40  - preferably above 80 )  Bad   LDL Chol      = 219      ( Goal is less than 70 ) ================================== High cholesterol is a condition in which the blood has high levels of a white, waxy substance similar to fat (cholesterol). The liver makes all the cholesterol that the body needs. The human body needs small amounts of cholesterol to help build cells. A person gets extra or excess cholesterol from the food that he or she eats. The blood carries cholesterol from the liver to the rest of the body. If you have high cholesterol, deposits (plaques) may build up on the walls of your arteries. Arteries are the blood vessels that carry blood away from your heart. These plaques make the arteries narrow and stiff. Cholesterol plaques increase your risk for heart attack and stroke. Work with your health care provider to keep your cholesterol levels in a healthy range. What increases the risk? The following factors may make you more likely to develop this condition: Eating foods that are high in animal fat (saturated fat) or cholesterol. Being overweight. Not getting enough exercise. A family history of high cholesterol (familial hypercholesterolemia). Use of tobacco products. Having diabetes. What are the signs or symptoms? In most cases, high cholesterol does not usually cause any symptoms. In severe cases, very high cholesterol levels can cause: Fatty bumps under the skin (xanthomas). A white or gray ring around the black center (pupil) of the eye. How is this diagnosed? This condition may be diagnosed based on the results of a blood test. If you are older than 57 years of age, your health care provider may check your cholesterol levels every 4-6 years. You may be checked more often if you have high cholesterol or  other risk factors for heart disease. The blood test for cholesterol measures: "Bad" cholesterol, or LDL cholesterol. This is the main type of cholesterol that causes heart disease. The desired level is less than 100 mg/dL (6.21 mmol/L). "Good" cholesterol, or HDL cholesterol. HDL helps protect against heart disease by cleaning the arteries and carrying the LDL to the liver for processing. The desired level for HDL is 60 mg/dL (3.08 mmol/L) or higher. Triglycerides. These are fats that your body can store or burn for energy. The desired level is less than 150 mg/dL (6.57 mmol/L). Total cholesterol. This measures the total amount of cholesterol in your blood and includes LDL, HDL, and triglycerides. The desired level is less than 200 mg/dL (8.46 mmol/L). How is this treated? Treatment for high cholesterol starts with lifestyle changes, such as diet and exercise. Diet changes. You may be asked to eat foods that have more fiber and less saturated fats or added sugar. Lifestyle changes. These may include regular exercise, maintaining a healthy weight, and quitting use of tobacco products. Medicines. These are given when diet and lifestyle changes have not worked. You may be prescribed a statin medicine to help lower your cholesterol levels. Follow these instructions at home: Eating and drinking  Eat a healthy, balanced diet. This diet includes: Daily servings of a variety of fresh, frozen, or canned fruits and vegetables.  Daily servings of whole grain foods that are rich in fiber. Foods that are low in saturated fats and trans fats. These include poultry and fish without skin, lean cuts of meat, and low-fat dairy products. A variety of fish, especially oily fish that contain omega-3 fatty acids. Aim to eat fish at least 2 times a week. Avoid foods and drinks that have added sugar. Use healthy cooking methods, such as roasting, grilling, broiling, baking, poaching, steaming, and stir-frying. Do not  fry your food except for stir-frying. If you drink alcohol: Limit how much you have to: 0-1 drink a day for women who are not pregnant. 0-2 drinks a day for men. Know how much alcohol is in a drink. In the [place] one drink equals one 12 oz bottle of beer (355 mL), one 5 oz glass of wine (148 mL), or one 1 oz glass of hard liquor (44 mL). Lifestyle  Get regular exercise. Aim to exercise for a total of 150 minutes a week. Increase your activity level by doing activities such as gardening, walking, and taking the stairs. Do not use any products that contain nicotine or tobacco. These products include cigarettes, chewing tobacco, and vaping devices, such as e-cigarettes. If you need help quitting, ask your health care provider. General instructions Take over-the-counter and prescription medicines only as told by your health care provider. Keep all follow-up visits. This is important. Where to find more information American Heart Association: www.heart.org National Heart, Lung, and Blood Institute: PopSteam.is Contact a health care provider if: You have trouble achieving or maintaining a healthy diet or weight. You are starting an exercise program. You are unable to stop smoking. Get help right away if: You have chest pain. You have trouble breathing. You have discomfort or pain in your jaw, neck, back, shoulder, or arm. You have any symptoms of a stroke. "BE FAST" is an easy way to remember the main warning signs of a stroke: B - Balance. Signs are dizziness, sudden trouble walking, or loss of balance. E - Eyes. Signs are trouble seeing or a sudden change in vision. F - Face. Signs are sudden weakness or numbness of the face, or the face or eyelid drooping on one side. A - Arms. Signs are weakness or numbness in an arm. This happens suddenly and usually on one side of the body. S - Speech. Signs are sudden trouble speaking, slurred speech, or trouble understanding what people say. T -  Time. Time to call emergency services. Write down what time symptoms started. You have other signs of a stroke, such as: A sudden, severe headache with no known cause. Nausea or vomiting. Seizure. These symptoms may represent a serious problem that is an emergency. Do not wait to see if the symptoms will go away. Get medical help right away. Call your local emergency services (911 in the [place] Do not drive yourself to the hospital. Summary Cholesterol plaques increase your risk for heart attack and stroke. Work with your health care provider to keep your cholesterol levels in a healthy range. Eat a healthy, balanced diet, get regular exercise, and maintain a healthy weight. Do not use any products that contain nicotine or tobacco. These products include cigarettes, chewing tobacco, and vaping devices, such as e-cigarettes. Get help right away if you have any symptoms of a stroke.

## 2023-03-23 ENCOUNTER — Encounter: Payer: Self-pay | Admitting: Internal Medicine

## 2023-03-23 ENCOUNTER — Other Ambulatory Visit: Payer: Self-pay | Admitting: Internal Medicine

## 2023-03-23 ENCOUNTER — Ambulatory Visit: Payer: BC Managed Care – PPO | Admitting: Internal Medicine

## 2023-03-23 VITALS — BP 110/68 | HR 86 | Temp 97.5°F | Resp 16 | Ht 67.0 in | Wt 161.6 lb

## 2023-03-23 DIAGNOSIS — Z79899 Other long term (current) drug therapy: Secondary | ICD-10-CM | POA: Diagnosis not present

## 2023-03-23 DIAGNOSIS — E782 Mixed hyperlipidemia: Secondary | ICD-10-CM

## 2023-03-23 DIAGNOSIS — F5101 Primary insomnia: Secondary | ICD-10-CM

## 2023-03-23 DIAGNOSIS — I10 Essential (primary) hypertension: Secondary | ICD-10-CM | POA: Diagnosis not present

## 2023-03-23 DIAGNOSIS — R7309 Other abnormal glucose: Secondary | ICD-10-CM | POA: Diagnosis not present

## 2023-03-23 DIAGNOSIS — F419 Anxiety disorder, unspecified: Secondary | ICD-10-CM

## 2023-03-23 MED ORDER — TRAZODONE HCL 50 MG PO TABS
ORAL_TABLET | ORAL | 3 refills | Status: AC
Start: 2023-03-23 — End: ?

## 2023-03-23 MED ORDER — ESCITALOPRAM OXALATE 10 MG PO TABS
ORAL_TABLET | ORAL | 3 refills | Status: AC
Start: 2023-03-23 — End: ?

## 2023-04-01 NOTE — Progress Notes (Signed)
^<^<^<^<^<^<^<^<^<^<^<^<^<^<^<^<^<^<^<^<^<^<^<^<^<^<^<^<^<^<^<^<^<^<^<^<^ ^>^>^>^>^>^>^>^>^>^>^>>^>^>^>^>^>^>^>^>^>^>^>^>^>^>^>^>^>^>^>^>^>^>^>^>^>  -   Lipoprotein A  ( AKA -  LP "little A" )  is very  which is [place] - very low risk I    - LPA is the most predictive for Cardiac Risk   ^<^<^<^<^<^<^<^<^<^<^<^<^<^<^<^<^<^<^<^<^<^<^<^<^<^<^<^<^<^<^<^<^<^<^<^<^ ^>^>^>^>^>^>^>^>^>^>^>^>^>^>^>^>^>^>^>^>^>^>^>^>^>^>^>^>^>^>^>^>^>^>^>^>^  -

## 2023-04-13 ENCOUNTER — Other Ambulatory Visit: Payer: Self-pay | Admitting: Nurse Practitioner

## 2023-04-13 DIAGNOSIS — N528 Other male erectile dysfunction: Secondary | ICD-10-CM

## 2023-04-30 ENCOUNTER — Encounter: Payer: Self-pay | Admitting: Internal Medicine

## 2023-05-18 ENCOUNTER — Encounter: Payer: Self-pay | Admitting: Internal Medicine

## 2023-05-18 NOTE — Progress Notes (Unsigned)
6 month Office Visit   Future Appointments  Date Time Provider Department  05/19/2023                   6 mo   9:30 AM [name], MD [facility]  08/18/2023                 9 mo  9:30 AM [name], MD [facility]  11/16/2023                   cpe 10:00 AM [name], MD [facility]            This very nice 57 y.o.  MWM  presents for  follow-up of HTN, HLD, Prediabetes and Vitamin D Deficiency.       HTN predates circa 2010. Patient's BP has been controlled and today's BP is                . Patient denies any cardiac symptoms as chest pain, palpitations, shortness of breath, dizziness or ankle swelling.       Patient's hyperlipidemia is not  controlled with diet and patient has been reticent to take meds for cholesterol in the past. Last lipids were not at goal :  Lab Results  Component Value Date   CHOL 186 03/23/2023   HDL 39 (L) 03/23/2023   LDLCALC 122 (H) 03/23/2023   TRIG 141 03/23/2023   CHOLHDL 4.8 03/23/2023    And his Cardio IQ  in July also showed ApoB = 121  ( Nl<90)  and LP (a) =  58 (Nl<75).        Patient has hx/o prediabetes (A1c 5.7%  /2012) and patient denies reactive hypoglycemic symptoms, visual blurring, diabetic polys or paresthesias. Last A1c was at goal :   Lab Results  Component Value Date   HGBA1C 5.9 (H) 03/17/2023         Finally, patient has history of Vitamin D Deficiency ("35" /2010) and does not take recommended 10,000 units /daily and last vitamin D was still very low  :    Lab Results  Component Value Date   VD25OH 38 02/16/2023       Current Outpatient Medications  Medication Instructions   B-12  1 tablet  Daily   diclofenac 4 g Topical  4 times daily, Bilateral shoulders   LEXAPRO 10 MG tablet Take 1 tablet Daily for Anxiety & Stress   ezetimibe 10 MG tablet TAKE ONE TABLET DAILY FOR CHOLESTEROL   losartan  100 MG tablet TAKE ONE TABLET BY MOUTH DAILY FOR blood pressure   naproxen sodium  440 mg, Oral,  Daily PRN   sildenafil 100 MG tablet TAKE 1/2 TO 1 TABLET AS NEEDED.   traZODone 50 MG tablet Take  1/2 to 1 tablet   2 to 3 hours  before Bedtime  as needed for Sleep   VITAMIN D  1 capsule  Daily   Zinc 50 MG TABS 1 tablet  Daily     Allergies  Allergen Reactions   Lisinopril Cough   Olmesartan Weakness, joint pain   Vicodin [Hydrocodone-Acetaminophen] "makes my skin crawl"     Past Medical History:  Diagnosis Date   Hyperlipidemia    Hypertension    Hypogonadism male    Vitamin D deficiency      Health Maintenance  Topic Date Due   COVID-19 Vaccine (1) Never done   Hepatitis C Screening  Never done   Zoster Vaccines- Shingrix (  1 of 2) Never done   COLONOSCOPY  Never done   INFLUENZA VACCINE  Never done   TETANUS/TDAP  07/31/2026   HIV Screening  Completed   HPV VACCINES  Aged Out     Immunization History  Administered Date(s) Administered   DTaP 09/02/2007   PPD Test 06/19/2014, 06/28/2015, 07/31/2016, 03/28/2020    Last Colon - Never & also declines to do Cologard   Past Surgical History:  Procedure Laterality Date   FINGER SURGERY     ROTATOR CUFF REPAIR       Family History  Problem Relation Age of Onset   Heart attack Mother    Kidney failure Father    Hypertension Brother    Hypothyroidism Brother    Hyperlipidemia Brother      Social History   Tobacco Use   Smoking status: Every Day    Packs/day: 1.00    Types: Cigarettes   Smokeless tobacco: Never  Substance Use Topics   Alcohol use: Yes    Alcohol/week: 7.0 standard drinks    Types: 7 Standard drinks or equivalent per week    Comment: occasional   Drug use: No      ROS Constitutional: Denies fever, chills, weight loss/gain, headaches, insomnia,  night sweats or change in appetite. Does c/o fatigue. Eyes: Denies redness, blurred vision, diplopia, discharge, itchy or watery eyes.  ENT: Denies discharge, congestion, post nasal drip, epistaxis, sore throat, earache, hearing  loss, dental pain, Tinnitus, Vertigo, Sinus pain or snoring.  Cardio: Denies chest pain, palpitations, irregular heartbeat, syncope, dyspnea, diaphoresis, orthopnea, PND, claudication or edema Respiratory: denies cough, dyspnea, DOE, pleurisy, hoarseness, laryngitis or wheezing.  Gastrointestinal: Denies dysphagia, heartburn, reflux, water brash, pain, cramps, nausea, vomiting, bloating, diarrhea, constipation, hematemesis, melena, hematochezia, jaundice or hemorrhoids Genitourinary: Denies dysuria, frequency, urgency, nocturia, hesitancy, discharge, hematuria or flank pain Musculoskeletal: Denies arthralgia, myalgia, stiffness, Jt. Swelling, pain, limp or strain/sprain. Denies Falls. Skin: Denies puritis, rash, hives, warts, acne, eczema or change in skin lesion Neuro: No weakness, tremor, incoordination, spasms, paresthesia or pain Psychiatric: Denies confusion, memory loss or sensory loss. Denies Depression. Endocrine: Denies change in weight, skin, hair change, nocturia, and paresthesia, diabetic polys, visual blurring or hyper / hypo glycemic episodes.  Heme/Lymph: No excessive bleeding, bruising or enlarged lymph nodes.   Physical Exam  There were no vitals taken for this visit.  General Appearance: Well nourished and well groomed and in no apparent distress.  Eyes: PERRLA, EOMs, conjunctiva no swelling or erythema, normal fundi and vessels. Sinuses: No frontal/maxillary tenderness ENT/Mouth: EACs patent / TMs  nl. Nares clear without erythema, swelling, mucoid exudates. Oral hygiene is good. No erythema, swelling, or exudate. Tongue normal, non-obstructing. Tonsils not swollen or erythematous. Hearing normal.  Neck: Supple, thyroid not palpable. No bruits, nodes or JVD. Respiratory: Respiratory effort normal.  BS equal and clear bilateral without rales, rhonci, wheezing or stridor. Cardio: Heart sounds are normal with regular rate and rhythm and no murmurs, rubs or gallops. Peripheral  pulses are normal and equal bilaterally without edema. No aortic or femoral bruits. Chest: symmetric with normal excursions and percussion.  Abdomen: Soft, with Nl bowel sounds. Nontender, no guarding, rebound, hernias, masses, or organomegaly.  Lymphatics: Non tender without lymphadenopathy.  Musculoskeletal: Full ROM all peripheral extremities, joint stability, 5/5 strength, and normal gait. Skin: Warm and dry without rashes, lesions, cyanosis, clubbing or  ecchymosis.  Neuro: Cranial nerves intact, reflexes equal bilaterally. Normal muscle tone, no cerebellar symptoms. Sensation intact.  Pysch: Alert  and oriented X 3 with normal affect, insight and judgment appropriate.   Assessment and Plan  1. Essential hypertension  - CBC with Differential/Platelet - COMPLETE METABOLIC PANEL WITH GFR - Magnesium - TSH  2. Hyperlipidemia, mixed  - TSH  3. Abnormal glucose  - Hemoglobin A1c - Insulin, random  4. Vitamin D deficiency  - VITAMIN D 25 Hydroxy   5. Medication management  - CBC with Differential/Platelet - COMPLETE METABOLIC PANEL WITH GFR - Magnesium - Lipid panel - TSH - Hemoglobin A1c - Insulin, random - VITAMIN D 25 Hydroxy        Patient was counseled in prudent diet, weight control to achieve/maintain BMI less than 25, BP monitoring, regular exercise and medications as discussed.  Discussed med effects and SE's. Routine screening labs and tests as requested with regular follow-up as recommended. Over 40 minutes of exam, counseling, chart review and high complex critical decision making was performed   Marinus Maw, MD

## 2023-05-18 NOTE — Patient Instructions (Signed)

## 2023-05-19 ENCOUNTER — Ambulatory Visit (INDEPENDENT_AMBULATORY_CARE_PROVIDER_SITE_OTHER): Payer: BC Managed Care – PPO | Admitting: Internal Medicine

## 2023-05-19 ENCOUNTER — Encounter: Payer: Self-pay | Admitting: Internal Medicine

## 2023-05-19 VITALS — BP 114/76 | HR 73 | Temp 97.9°F | Resp 16 | Ht 67.0 in | Wt 170.0 lb

## 2023-05-19 DIAGNOSIS — R7309 Other abnormal glucose: Secondary | ICD-10-CM

## 2023-05-19 DIAGNOSIS — Z79899 Other long term (current) drug therapy: Secondary | ICD-10-CM | POA: Diagnosis not present

## 2023-05-19 DIAGNOSIS — I10 Essential (primary) hypertension: Secondary | ICD-10-CM

## 2023-05-19 DIAGNOSIS — E559 Vitamin D deficiency, unspecified: Secondary | ICD-10-CM | POA: Diagnosis not present

## 2023-05-19 DIAGNOSIS — E782 Mixed hyperlipidemia: Secondary | ICD-10-CM | POA: Diagnosis not present

## 2023-05-20 LAB — CBC WITH DIFFERENTIAL/PLATELET
Absolute Monocytes: 340 {cells}/uL (ref 200–950)
Basophils Absolute: 63 {cells}/uL (ref 0–200)
Basophils Relative: 1 %
Eosinophils Absolute: 277 {cells}/uL (ref 15–500)
Eosinophils Relative: 4.4 %
HCT: 44 % (ref 38.5–50.0)
Hemoglobin: 15.1 g/dL (ref 13.2–17.1)
Lymphs Abs: 1808 {cells}/uL (ref 850–3900)
MCH: 29.4 pg (ref 27.0–33.0)
MCHC: 34.3 g/dL (ref 32.0–36.0)
MCV: 85.8 fL (ref 80.0–100.0)
MPV: 10.8 fL (ref 7.5–12.5)
Monocytes Relative: 5.4 %
Neutro Abs: 3812 {cells}/uL (ref 1500–7800)
Neutrophils Relative %: 60.5 %
Platelets: 163 10*3/uL (ref 140–400)
RBC: 5.13 10*6/uL (ref 4.20–5.80)
RDW: 13 % (ref 11.0–15.0)
Total Lymphocyte: 28.7 %
WBC: 6.3 10*3/uL (ref 3.8–10.8)

## 2023-05-21 NOTE — Progress Notes (Signed)
<>*<>*<>*<>*<>*<>*<>*<>*<>*<>*<>*<>*<>*<>*<>*<>*<>*<>*<>*<>*<>*<>*<>*<>*<> <>*<>*<>*<>*<>*<>*<>*<>*<>*<>*<>*<>*<>*<>*<>*<>*<>*<>*<>*<>*<>*<>*<>*<>*<>  -   Test results slightly outside the reference range are not unusual. If there is anything important, I will review this with you,  otherwise it is considered normal test values.  If you have further questions,  please do not hesitate to contact me at the office or via My Chart.   <>*<>*<>*<>*<>*<>*<>*<>*<>*<>*<>*<>*<>*<>*<>*<>*<>*<>*<>*<>*<>*<>*<>*<>*<> <>*<>*<>*<>*<>*<>*<>*<>*<>*<>*<>*<>*<>*<>*<>*<>*<>*<>*<>*<>*<>*<>*<>*<>*<>  -  A1c = 5.3% - Back in Normal nonDiabetic range - Excellent !  <>*<>*<>*<>*<>*<>*<>*<>*<>*<>*<>*<>*<>*<>*<>*<>*<>*<>*<>*<>*<>*<>*<>*<>*<> <>*<>*<>*<>*<>*<>*<>*<>*<>*<>*<>*<>*<>*<>*<>*<>*<>*<>*<>*<>*<>*<>*<>*<>*<>  - Vitamin D = 71 - Also Excellent - Please keep dosage same   <>*<>*<>*<>*<>*<>*<>*<>*<>*<>*<>*<>*<>*<>*<>*<>*<>*<>*<>*<>*<>*<>*<>*<>*<> <>*<>*<>*<>*<>*<>*<>*<>*<>*<>*<>*<>*<>*<>*<>*<>*<>*<>*<>*<>*<>*<>*<>*<>*<>  - All Else - CBC - Kidneys - Electrolytes - Liver - Magnesium & Thyroid    - all  Normal / OK  <>*<>*<>*<>*<>*<>*<>*<>*<>*<>*<>*<>*<>*<>*<>*<>*<>*<>*<>*<>*<>*<>*<>*<>*<> <>*<>*<>*<>*<>*<>*<>*<>*<>*<>*<>*<>*<>*<>*<>*<>*<>*<>*<>*<>*<>*<>*<>*<>*<>  -  Keep up the [place] Work  !  <>*<>*<>*<>*<>*<>*<>*<>*<>*<>*<>*<>*<>*<>*<>*<>*<>*<>*<>*<>*<>*<>*<>*<>*<> <>*<>*<>*<>*<>*<>*<>*<>*<>*<>*<>*<>*<>*<>*<>*<>*<>*<>*<>*<>*<>*<>*<>*<>*<>

## 2023-08-17 ENCOUNTER — Encounter: Payer: Self-pay | Admitting: Internal Medicine

## 2023-08-17 DIAGNOSIS — Z8249 Family history of ischemic heart disease and other diseases of the circulatory system: Secondary | ICD-10-CM | POA: Insufficient documentation

## 2023-08-17 NOTE — Patient Instructions (Signed)

## 2023-08-17 NOTE — Progress Notes (Signed)
[place]      ADULT   &   ADOLESCENT      INTERNAL MEDICINE  [name], M.D.          Rance Muir, ANP        Adela Glimpse, FNP  [facility] [address]  [place], [place]. [address] Telephone [phone] Telefax [phone]   Future Appointments  Date Time Provider Department  08/18/2023             9 mo ov  9:30 AM [name], MD [facility]  11/25/2023               cpe 10:00 AM [name], MD [facility]         This very nice 57 y.o.  MWM  presents for  follow-up of HTN, HLD, Prediabetes and Vitamin D Deficiency. Patient had a Lap App in Nov 2023.  HTN predates since  2010. Patient's BP has been controlled and today's BP is 118/70 . In July, patient has a Normal Cardiac Cath .   Patient denies any cardiac symptoms as chest pain, palpitations, shortness of breath, dizziness or ankle swelling.       Patient's hyperlipidemia is not  controlled with diet and patient has been reticent to take meds for cholesterol in the past. Last lipids were not at goal :  Lab Results  Component Value Date   CHOL 186 03/23/2023   HDL 39 (L) 03/23/2023   LDLCALC 122 (H) 03/23/2023   TRIG 141 03/23/2023   CHOLHDL 4.8 03/23/2023    And his Cardio IQ  in July also showed ApoB = 121  ( Nl<90)  and LP (a) =  58 (Nl<75).        Patient has hx/o prediabetes (A1c 5.7%  /2012) and patient denies reactive hypoglycemic symptoms, visual blurring, diabetic polys or paresthesias. Last A1c was not at goal :   Lab Results  Component Value Date   HGBA1C 5.9 (H) 03/17/2023         Finally, patient has history of Vitamin D Deficiency ("35" /2010) and does not take recommended 10,000 units /daily and last vitamin D was still very low :    Lab Results  Component Value Date   VD25OH 38 02/16/2023       Current Outpatient Medications  Medication Instructions   B-12  1 tablet  Daily   diclofenac 4 g Topical  4 times daily   LEXAPRO 10 MG tablet Take 1 tablet Daily for Anxiety & Stress   ezetimibe 10 MG tablet TAKE ONE TABLET DAILY    losartan  100 MG tablet TAKE ONE TABLET  DAILY    naproxen sodium 440 mg Daily PRN   sildenafil 100 MG tablet TAKE 1/2 TO 1 TABLET AS NEEDED.   traZODone 50 MG tablet Take  1/2 to 1 tablet   2 to 3 hours  before Bedtime  as needed for Sleep   VITAMIN D  1 capsule  Daily   Zinc 50 MG TABS 1 tablet  Daily     Allergies   Allergen Reactions   Lisinopril Cough   Olmesartan Weakness, joint pain   Vicodin [Hydrocodone-Acetaminophen] "makes my skin crawl"     Past Medical History:  Diagnosis Date   Hyperlipidemia    Hypertension    Hypogonadism male    Vitamin D deficiency      Health Maintenance  Topic Date Due   COVID-19 Vaccine (1) Never done   Hepatitis C Screening  Never done   Zoster Vaccines- Shingrix (1 of 2) Never done   COLONOSCOPY  Never done   INFLUENZA VACCINE  Never done   TETANUS/TDAP  07/31/2026   HIV Screening  Completed   HPV VACCINES  Aged Out     Immunization History  Administered Date(s) Administered   DTaP 09/02/2007   PPD Test 06/19/2014, 06/28/2015, 07/31/2016, 03/28/2020    Last Colon - Never & also declines to do Cologard   Past Surgical History:  Procedure Laterality Date   FINGER SURGERY     ROTATOR CUFF REPAIR       Family History  Problem Relation Age of Onset   Heart attack Mother    Kidney failure Father    Hypertension Brother    Hypothyroidism Brother    Hyperlipidemia Brother      Social History   Tobacco Use   Smoking status:  Every Day    Packs/day: 1.00    Types: Cigarettes   Smokeless tobacco: Never  Substance Use Topics   Alcohol use: Yes    Alcohol/week: 7.0 standard drinks    Types: 7 Standard drinks or equivalent per week    Comment: occasional   Drug use: No      ROS Constitutional: Denies fever, chills, weight loss/gain, headaches, insomnia,  night sweats or change in appetite. Does c/o fatigue. Eyes: Denies redness, blurred vision, diplopia, discharge, itchy or watery eyes.  ENT: Denies discharge, congestion, post nasal drip, epistaxis, sore throat, earache, hearing loss, dental pain, Tinnitus, Vertigo, Sinus pain or snoring.  Cardio: Denies chest pain, palpitations, irregular heartbeat, syncope, dyspnea, diaphoresis, orthopnea, PND, claudication or edema Respiratory: denies cough, dyspnea, DOE, pleurisy, hoarseness,  laryngitis or wheezing.  Gastrointestinal: Denies dysphagia, heartburn, reflux, water brash, pain, cramps, nausea, vomiting, bloating, diarrhea, constipation, hematemesis, melena, hematochezia, jaundice or hemorrhoids Genitourinary: Denies dysuria, frequency, urgency, nocturia, hesitancy, discharge, hematuria or flank pain Musculoskeletal: Denies arthralgia, myalgia, stiffness, Jt. Swelling, pain, limp or strain/sprain. Denies Falls. Skin: Denies puritis, rash, hives, warts, acne, eczema or change in skin lesion Neuro: No weakness, tremor, incoordination, spasms, paresthesia or pain Psychiatric: Denies confusion, memory loss or sensory loss. Denies Depression. Endocrine: Denies change in weight, skin, hair change, nocturia, and paresthesia, diabetic polys, visual blurring or hyper / hypo glycemic episodes.  Heme/Lymph: No excessive bleeding, bruising or enlarged lymph nodes.   Physical Exam  BP 118/70   Pulse 77   Temp 97.9 F (36.6 C)   Resp 16   Ht 5\' 7"  (1.702 m)   Wt 170 lb 9.6 oz (77.4 kg)   SpO2 99%   BMI 26.72 kg/m   General Appearance: Well nourished and well groomed and in no apparent distress.  Eyes: PERRLA, EOMs, conjunctiva no swelling or erythema, normal fundi and vessels. Sinuses: No frontal/maxillary tenderness ENT/Mouth: EACs patent / TMs  nl. Nares clear without erythema, swelling, mucoid exudates. Oral hygiene is good. No erythema, swelling, or exudate. Tongue normal, non-obstructing. Tonsils not swollen or erythematous. Hearing normal.  Neck: Supple, thyroid not palpable. No bruits, nodes or JVD. Respiratory: Respiratory effort normal.  BS equal and clear bilateral without rales, rhonci, wheezing or stridor. Cardio: Heart sounds are normal with regular rate and rhythm and no murmurs, rubs or gallops. Peripheral pulses are normal and equal bilaterally without edema. No aortic or femoral bruits. Chest: symmetric with normal excursions and percussion.  Abdomen: Soft,  with Nl bowel sounds. Nontender, no guarding, rebound, hernias, masses, or organomegaly.  Lymphatics: Non tender without lymphadenopathy.  Musculoskeletal: Full ROM all peripheral extremities, joint stability, 5/5 strength, and normal gait. Skin: Warm and dry without rashes, lesions, cyanosis, clubbing or  ecchymosis.  Neuro: Cranial nerves intact, reflexes equal bilaterally. Normal muscle tone, no cerebellar symptoms. Sensation intact.  Pysch: Alert and oriented X 3 with normal affect, insight and judgment appropriate.   Assessment and Plan  1. Essential hypertension  - CBC with Differential/Platelet - COMPLETE METABOLIC PANEL WITH GFR - Magnesium - TSH  2. Hyperlipidemia, mixed  - TSH  3. Abnormal glucose  - Hemoglobin A1c - Insulin, random  4. Vitamin D deficiency  - VITAMIN D 25 Hydroxy   5. Medication management  - CBC with Differential/Platelet - COMPLETE METABOLIC PANEL WITH GFR - Magnesium - Lipid panel - TSH - Hemoglobin A1c - Insulin, random - VITAMIN D 25 Hydroxy        Patient was counseled in prudent diet,  weight control to achieve/maintain BMI less than 25, BP monitoring, regular exercise and medications as discussed.  Discussed med effects and SE's. Routine screening labs and tests as requested with regular follow-up as recommended. Over 40 minutes of exam, counseling, chart review and high complex critical decision making was performed   Marinus Maw, MD

## 2023-08-18 ENCOUNTER — Ambulatory Visit (INDEPENDENT_AMBULATORY_CARE_PROVIDER_SITE_OTHER): Payer: BC Managed Care – PPO | Admitting: Internal Medicine

## 2023-08-18 VITALS — BP 118/70 | HR 77 | Temp 97.9°F | Resp 16 | Ht 67.0 in | Wt 170.6 lb

## 2023-08-18 DIAGNOSIS — E349 Endocrine disorder, unspecified: Secondary | ICD-10-CM

## 2023-08-18 DIAGNOSIS — Z8249 Family history of ischemic heart disease and other diseases of the circulatory system: Secondary | ICD-10-CM

## 2023-08-18 DIAGNOSIS — E782 Mixed hyperlipidemia: Secondary | ICD-10-CM | POA: Diagnosis not present

## 2023-08-18 DIAGNOSIS — R7309 Other abnormal glucose: Secondary | ICD-10-CM | POA: Diagnosis not present

## 2023-08-18 DIAGNOSIS — Z79899 Other long term (current) drug therapy: Secondary | ICD-10-CM

## 2023-08-18 DIAGNOSIS — E559 Vitamin D deficiency, unspecified: Secondary | ICD-10-CM

## 2023-08-18 DIAGNOSIS — I10 Essential (primary) hypertension: Secondary | ICD-10-CM

## 2023-08-18 DIAGNOSIS — R5383 Other fatigue: Secondary | ICD-10-CM

## 2023-08-19 LAB — HEMOGLOBIN A1C
Hgb A1c MFr Bld: 5.2 %{Hb} (ref <5.7)
Mean Plasma Glucose: 103 mg/dL
eAG (mmol/L): 5.7 mmol/L

## 2023-08-19 LAB — CBC WITH DIFFERENTIAL/PLATELET
Absolute Lymphocytes: 1646 {cells}/uL (ref 850–3900)
Absolute Monocytes: 428 {cells}/uL (ref 200–950)
Basophils Absolute: 56 {cells}/uL (ref 0–200)
Basophils Relative: 0.6 %
Eosinophils Absolute: 214 {cells}/uL (ref 15–500)
Eosinophils Relative: 2.3 %
HCT: 42.3 % (ref 38.5–50.0)
Hemoglobin: 14.4 g/dL (ref 13.2–17.1)
MCH: 28.8 pg (ref 27.0–33.0)
MCHC: 34 g/dL (ref 32.0–36.0)
MCV: 84.6 fL (ref 80.0–100.0)
MPV: 10.4 fL (ref 7.5–12.5)
Monocytes Relative: 4.6 %
Neutro Abs: 6956 {cells}/uL (ref 1500–7800)
Neutrophils Relative %: 74.8 %
Platelets: 189 10*3/uL (ref 140–400)
RBC: 5 10*6/uL (ref 4.20–5.80)
RDW: 12.9 % (ref 11.0–15.0)
Total Lymphocyte: 17.7 %
WBC: 9.3 10*3/uL (ref 3.8–10.8)

## 2023-08-19 LAB — VITAMIN D 25 HYDROXY (VIT D DEFICIENCY, FRACTURES): Vit D, 25-Hydroxy: 67 ng/mL (ref 30–100)

## 2023-08-19 LAB — LIPID PANEL
Cholesterol: 180 mg/dL (ref <200)
HDL: 49 mg/dL (ref >=40)
LDL Cholesterol (Calc): 112 mg/dL — ABNORMAL HIGH
Non-HDL Cholesterol (Calc): 131 mg/dL — ABNORMAL HIGH (ref <130)
Total CHOL/HDL Ratio: 3.7 (calc) (ref <5.0)
Triglycerides: 91 mg/dL (ref <150)

## 2023-08-19 LAB — COMPLETE METABOLIC PANEL WITH GFR
AG Ratio: 1.9 (calc) (ref 1.0–2.5)
ALT: 14 U/L (ref 9–46)
AST: 19 U/L (ref 10–35)
Albumin: 4.4 g/dL (ref 3.6–5.1)
Alkaline phosphatase (APISO): 73 U/L (ref 35–144)
BUN: 11 mg/dL (ref 7–25)
CO2: 30 mmol/L (ref 20–32)
Calcium: 9.7 mg/dL (ref 8.6–10.3)
Chloride: 103 mmol/L (ref 98–110)
Creat: 1.19 mg/dL (ref 0.70–1.30)
Globulin: 2.3 g/dL (ref 1.9–3.7)
Glucose, Bld: 92 mg/dL (ref 65–99)
Potassium: 4.6 mmol/L (ref 3.5–5.3)
Sodium: 139 mmol/L (ref 135–146)
Total Bilirubin: 0.5 mg/dL (ref 0.2–1.2)
Total Protein: 6.7 g/dL (ref 6.1–8.1)
eGFR: 71 mL/min/{1.73_m2} (ref >=60)

## 2023-08-19 LAB — MAGNESIUM: Magnesium: 2 mg/dL (ref 1.5–2.5)

## 2023-08-19 LAB — INSULIN, RANDOM: Insulin: 4.7 u[IU]/mL

## 2023-08-19 LAB — TSH: TSH: 0.78 m[IU]/L (ref 0.40–4.50)

## 2023-08-19 LAB — TESTOSTERONE: Testosterone: 542 ng/dL (ref 250–827)

## 2023-08-19 NOTE — Progress Notes (Signed)
[] [] [] [] [] [] [] [] [] [] [] [] [] [] [] [] [] [] [] [] [] [] [] [] [] [] [] [] [] [] [] [] [] [] [] [] [] [] [] [] [] ][] [] [] [] [] [] [] [] [] [] [] [] [] [] [] [] [] [] [] [] [] [] [[] [] [] [] []  [] [] [] [] [] [] [] [] [] [] [] [] [] [] [] [] [] [] [] [] [] [] [] [] [] [] [] [] [] [] [] [] [] [] [] [] [] [] [] [] [] ][] [] [] [] [] [] [] [] [] [] [] [] [] [] [] [] [] [] [] [] [] [] [[] [] [] [] []  -Test results slightly outside the reference range are not unusual. If there is anything important, I will review this with you,  otherwise it is considered normal test values.  If you have further questions,  please do not hesitate to contact me at the office or via My Chart.  [] [] [] [] [] [] [] [] [] [] [] [] [] [] [] [] [] [] [] [] [] [] [] [] [] [] [] [] [] [] [] [] [] [] [] [] [] [] [] [] [] ][] [] [] [] [] [] [] [] [] [] [] [] [] [] [] [] [] [] [] [] [] [] [[] [] [] [] []  [] [] [] [] [] [] [] [] [] [] [] [] [] [] [] [] [] [] [] [] [] [] [] [] [] [] [] [] [] [] [] [] [] [] [] [] [] [] [] [] [] ][] [] [] [] [] [] [] [] [] [] [] [] [] [] [] [] [] [] [] [] [] [] [[] [] [] [] []   -  Chol = 180 - Great  - LDL = 112 is still too high  - Cholesterol is too high - Recommend low cholesterol diet   - Cholesterol only comes from animal sources                                                                    - ie. meat, dairy, egg yolks  - Eat all the vegetables you want.  - Avoid Meat, Avoid Meat,  Avoid Meat             - especially Red Meat - Beef AND Pork .  - Avoid cheese & dairy - milk & ice cream.     - Cheese is the most concentrated form of trans-fats which                                                                              is the worst thing to clog up our arteries.    - Veggie cheese is OK which can be found in the fresh                                                produce section at [employer] or [employer] or [employer] [] [] [] [] [] [] [] [] [] [] [] [] [] [] [] [] [] [] [] [] [] [] [] [] [] [] [] [] [] [] [] [] [] [] [] [] [] [] [] [] [] ][] [] [] [] [] [] [] [] [] [] [] [] [] [] [] [] [] [] [] [] [] [] [[] [] [] [] []   - Vitamin D = 67  - Great  !  Please keep dosage same                                                                                                                                                                                                                                                                                                                                                                                                                                                                                                                                                                                                                                                                                                                                                                                                                                                                                                                                                                                                                                                                                                                                                                                                                                                                                                                                                                                                                                                                                                                                                                                                                                                                                                                                                                                                                                                                                                                                                                                                                                                                                             [] [] [] [] [] [] [] [] [] [] [] [] [] [] [] [] [] [] [] [] [] [] [] [] [] [] [] [] [] [] [] [] [] [] [] [] [] [] [] [] [] ][] [] [] [] [] [] [] [] [] [] [] [] [] [] [] [] [] [] [] [] [] [] [[] [] [] [] []   -  Testosterone = 542 is well in the Normal range  (  Normal range = 250-827 ) [] [] [] [] [] [] [] [] [] [] [] [] [] [] [] [] [] [] [] [] [] [] [] [] [] [] [] [] [] [] [] [] [] [] [] [] [] [] [] [] [] ][] [] [] [] [] [] [] [] [] [] [] [] [] [] [] [] [] [] [] [] [] [] [[] [] [] [] []   -  All Else - CBC - Kidneys - Electrolytes - Liver - Magnesium & Thyroid    - all  Normal / OK [] [] [] [] [] [] [] [] [] [] [] [] [] [] [] [] [] [] [] [] [] [] [] [] [] [] [] [] [] [] [] [] [] [] [] [] [] [] [] [] [] ][] [] [] [] [] [] [] [] [] [] [] [] [] [] [] [] [] [] [] [] [] [] [[] [] [] [] []   - Keep up the [place] Work  !  [] [] [] [] [] [] [] [] [] [] [] [] [] [] [] [] [] [] [] [] [] [] [] [] [] [] [] [] [] [] [] [] [] [] [] [] [] [] [] [] [] ][] [] [] [] [] [] [] [] [] [] [] [] [] [] [] [] [] [] [] [] [] [] [[] [] [] [] []   -

## 2023-09-18 ENCOUNTER — Other Ambulatory Visit: Payer: Self-pay | Admitting: Nurse Practitioner

## 2023-09-18 DIAGNOSIS — E782 Mixed hyperlipidemia: Secondary | ICD-10-CM

## 2023-10-20 DIAGNOSIS — H5212 Myopia, left eye: Secondary | ICD-10-CM | POA: Diagnosis not present

## 2023-10-20 DIAGNOSIS — H10413 Chronic giant papillary conjunctivitis, bilateral: Secondary | ICD-10-CM | POA: Diagnosis not present

## 2023-10-20 DIAGNOSIS — H5201 Hypermetropia, right eye: Secondary | ICD-10-CM | POA: Diagnosis not present

## 2023-10-21 ENCOUNTER — Other Ambulatory Visit: Payer: Self-pay

## 2023-10-21 DIAGNOSIS — N528 Other male erectile dysfunction: Secondary | ICD-10-CM

## 2023-10-21 MED ORDER — SILDENAFIL CITRATE 100 MG PO TABS: ORAL_TABLET | ORAL | 0 refills | Status: AC

## 2023-11-03 DIAGNOSIS — I10 Essential (primary) hypertension: Secondary | ICD-10-CM | POA: Diagnosis not present

## 2023-11-04 ENCOUNTER — Encounter: Payer: BC Managed Care – PPO | Admitting: Internal Medicine

## 2023-11-06 ENCOUNTER — Encounter: Payer: BC Managed Care – PPO | Admitting: Internal Medicine

## 2023-11-16 ENCOUNTER — Encounter: Payer: BC Managed Care – PPO | Admitting: Internal Medicine

## 2023-11-19 ENCOUNTER — Ambulatory Visit: Payer: BC Managed Care – PPO | Admitting: Family Medicine

## 2023-11-19 DIAGNOSIS — N528 Other male erectile dysfunction: Secondary | ICD-10-CM | POA: Diagnosis not present

## 2023-11-23 ENCOUNTER — Ambulatory Visit: Payer: BC Managed Care – PPO | Admitting: Family Medicine

## 2023-11-25 ENCOUNTER — Encounter: Payer: BC Managed Care – PPO | Admitting: Internal Medicine

## 2024-02-01 DIAGNOSIS — Z125 Encounter for screening for malignant neoplasm of prostate: Secondary | ICD-10-CM | POA: Diagnosis not present

## 2024-02-01 DIAGNOSIS — E7800 Pure hypercholesterolemia, unspecified: Secondary | ICD-10-CM | POA: Diagnosis not present

## 2024-02-10 DIAGNOSIS — Z1331 Encounter for screening for depression: Secondary | ICD-10-CM | POA: Diagnosis not present

## 2024-02-10 DIAGNOSIS — R82998 Other abnormal findings in urine: Secondary | ICD-10-CM | POA: Diagnosis not present

## 2024-02-10 DIAGNOSIS — I10 Essential (primary) hypertension: Secondary | ICD-10-CM | POA: Diagnosis not present

## 2024-02-10 DIAGNOSIS — Z1339 Encounter for screening examination for other mental health and behavioral disorders: Secondary | ICD-10-CM | POA: Diagnosis not present

## 2024-02-10 DIAGNOSIS — Z0000 Encounter for general adult medical examination without abnormal findings: Secondary | ICD-10-CM | POA: Diagnosis not present
